# Patient Record
Sex: Female | Born: 1945
Health system: Southern US, Community
[De-identification: ages and names within clinical notes are randomized; demographics above are authoritative.]

## PROBLEM LIST (undated history)

## (undated) ENCOUNTER — Emergency Department (HOSPITAL_COMMUNITY): Payer: Self-pay | Source: Home / Self Care

## (undated) DIAGNOSIS — K649 Unspecified hemorrhoids: Secondary | ICD-10-CM

## (undated) DIAGNOSIS — K635 Polyp of colon: Secondary | ICD-10-CM

## (undated) DIAGNOSIS — K2940 Chronic atrophic gastritis without bleeding: Secondary | ICD-10-CM

## (undated) DIAGNOSIS — I2510 Atherosclerotic heart disease of native coronary artery without angina pectoris: Secondary | ICD-10-CM

## (undated) DIAGNOSIS — K449 Diaphragmatic hernia without obstruction or gangrene: Secondary | ICD-10-CM

## (undated) DIAGNOSIS — K5730 Diverticulosis of large intestine without perforation or abscess without bleeding: Secondary | ICD-10-CM

## (undated) DIAGNOSIS — I10 Essential (primary) hypertension: Secondary | ICD-10-CM

## (undated) DIAGNOSIS — M1990 Unspecified osteoarthritis, unspecified site: Secondary | ICD-10-CM

## (undated) DIAGNOSIS — D509 Iron deficiency anemia, unspecified: Secondary | ICD-10-CM

## (undated) HISTORY — DX: Diverticulosis of large intestine without perforation or abscess without bleeding: K57.30

## (undated) HISTORY — DX: Diaphragmatic hernia without obstruction or gangrene: K44.9

## (undated) HISTORY — DX: Chronic atrophic gastritis without bleeding: K29.40

## (undated) HISTORY — DX: Iron deficiency anemia, unspecified: D50.9

## (undated) HISTORY — DX: Polyp of colon: K63.5

## (undated) HISTORY — DX: Unspecified osteoarthritis, unspecified site: M19.90

## (undated) HISTORY — DX: Unspecified hemorrhoids: K64.9

## (undated) HISTORY — DX: Atherosclerotic heart disease of native coronary artery without angina pectoris: I25.10

## (undated) HISTORY — DX: Essential (primary) hypertension: I10

---

## 1968-04-15 HISTORY — PX: TONSILLECTOMY AND ADENOIDECTOMY: SUR1326

## 1976-04-15 HISTORY — PX: ABDOMINAL HYSTERECTOMY: SHX81

## 1977-04-15 HISTORY — PX: CHOLECYSTECTOMY: SHX55

## 1999-04-16 HISTORY — PX: KNEE SURGERY: SHX244

## 2004-03-21 ENCOUNTER — Emergency Department (HOSPITAL_COMMUNITY): Admission: EM | Admit: 2004-03-21 | Discharge: 2004-03-21 | Payer: Self-pay | Admitting: Family Medicine

## 2004-03-25 ENCOUNTER — Emergency Department (HOSPITAL_COMMUNITY): Admission: EM | Admit: 2004-03-25 | Discharge: 2004-03-25 | Payer: Self-pay | Admitting: Family Medicine

## 2005-03-20 ENCOUNTER — Ambulatory Visit: Payer: Self-pay | Admitting: Gastroenterology

## 2005-04-04 ENCOUNTER — Ambulatory Visit: Payer: Self-pay | Admitting: Oncology

## 2005-04-15 LAB — HM PAP SMEAR: HM Pap smear: NORMAL

## 2005-04-25 ENCOUNTER — Ambulatory Visit: Payer: Self-pay | Admitting: Gastroenterology

## 2005-05-09 ENCOUNTER — Ambulatory Visit: Payer: Self-pay | Admitting: Gastroenterology

## 2005-05-09 ENCOUNTER — Encounter (INDEPENDENT_AMBULATORY_CARE_PROVIDER_SITE_OTHER): Payer: Self-pay | Admitting: Specialist

## 2005-07-19 ENCOUNTER — Ambulatory Visit: Payer: Self-pay | Admitting: Oncology

## 2006-06-16 ENCOUNTER — Other Ambulatory Visit: Admission: RE | Admit: 2006-06-16 | Discharge: 2006-06-16 | Payer: Self-pay | Admitting: Family Medicine

## 2008-07-19 ENCOUNTER — Emergency Department (HOSPITAL_COMMUNITY): Admission: EM | Admit: 2008-07-19 | Discharge: 2008-07-19 | Payer: Self-pay | Admitting: Family Medicine

## 2008-12-26 ENCOUNTER — Emergency Department (HOSPITAL_COMMUNITY): Admission: EM | Admit: 2008-12-26 | Discharge: 2008-12-27 | Payer: Self-pay | Admitting: Emergency Medicine

## 2010-07-25 LAB — POCT URINALYSIS DIP (DEVICE)
Bilirubin Urine: NEGATIVE
Glucose, UA: NEGATIVE mg/dL
Ketones, ur: NEGATIVE mg/dL
Nitrite: POSITIVE — AB
Protein, ur: 100 mg/dL — AB
Specific Gravity, Urine: 1.02 (ref 1.005–1.030)
Urobilinogen, UA: 0.2 mg/dL (ref 0.0–1.0)
pH: 5.5 (ref 5.0–8.0)

## 2010-07-25 LAB — POCT I-STAT, CHEM 8
BUN: 17 mg/dL (ref 6–23)
Calcium, Ion: 1.21 mmol/L (ref 1.12–1.32)
Chloride: 107 mEq/L (ref 96–112)
Creatinine, Ser: 1.1 mg/dL (ref 0.4–1.2)
Glucose, Bld: 98 mg/dL (ref 70–99)
HCT: 33 % — ABNORMAL LOW (ref 36.0–46.0)
Hemoglobin: 11.2 g/dL — ABNORMAL LOW (ref 12.0–15.0)
Potassium: 3.8 mEq/L (ref 3.5–5.1)
Sodium: 142 mEq/L (ref 135–145)
TCO2: 26 mmol/L (ref 0–100)

## 2010-07-25 LAB — TSH: TSH: 1.307 u[IU]/mL (ref 0.350–4.500)

## 2010-10-27 ENCOUNTER — Inpatient Hospital Stay (INDEPENDENT_AMBULATORY_CARE_PROVIDER_SITE_OTHER)
Admission: RE | Admit: 2010-10-27 | Discharge: 2010-10-27 | Disposition: A | Discharge status: Home / Self Care | Payer: Medicare Other | Source: Ambulatory Visit | Attending: Emergency Medicine | Admitting: Emergency Medicine

## 2010-10-27 DIAGNOSIS — R6889 Other general symptoms and signs: Secondary | ICD-10-CM

## 2010-12-10 ENCOUNTER — Ambulatory Visit (INDEPENDENT_AMBULATORY_CARE_PROVIDER_SITE_OTHER): Payer: Medicare Other | Admitting: Internal Medicine

## 2010-12-10 ENCOUNTER — Other Ambulatory Visit: Payer: Self-pay | Admitting: Internal Medicine

## 2010-12-10 ENCOUNTER — Encounter: Payer: Self-pay | Admitting: Internal Medicine

## 2010-12-10 ENCOUNTER — Other Ambulatory Visit (INDEPENDENT_AMBULATORY_CARE_PROVIDER_SITE_OTHER): Payer: Medicare Other

## 2010-12-10 DIAGNOSIS — R300 Dysuria: Secondary | ICD-10-CM

## 2010-12-10 DIAGNOSIS — R5381 Other malaise: Secondary | ICD-10-CM

## 2010-12-10 DIAGNOSIS — K5732 Diverticulitis of large intestine without perforation or abscess without bleeding: Secondary | ICD-10-CM

## 2010-12-10 DIAGNOSIS — D649 Anemia, unspecified: Secondary | ICD-10-CM

## 2010-12-10 DIAGNOSIS — R5383 Other fatigue: Secondary | ICD-10-CM

## 2010-12-10 LAB — CBC WITH DIFFERENTIAL/PLATELET
Basophils Absolute: 0 10*3/uL (ref 0.0–0.1)
Basophils Relative: 0.5 % (ref 0.0–3.0)
Eosinophils Absolute: 0 10*3/uL (ref 0.0–0.7)
Eosinophils Relative: 0.7 % (ref 0.0–5.0)
HCT: 30.9 % — ABNORMAL LOW (ref 36.0–46.0)
Hemoglobin: 9.9 g/dL — ABNORMAL LOW (ref 12.0–15.0)
Lymphs Abs: 1.8 10*3/uL (ref 0.7–4.0)
MCHC: 32.2 g/dL (ref 30.0–36.0)
MCV: 71.2 fl — ABNORMAL LOW (ref 78.0–100.0)
Monocytes Absolute: 0.4 10*3/uL (ref 0.1–1.0)
Monocytes Relative: 7.1 % (ref 3.0–12.0)
Neutro Abs: 3.1 10*3/uL (ref 1.4–7.7)
Neutrophils Relative %: 57.7 % (ref 43.0–77.0)
Platelets: 474 10*3/uL — ABNORMAL HIGH (ref 150.0–400.0)
RBC: 4.34 Mil/uL (ref 3.87–5.11)
RDW: 19.6 % — ABNORMAL HIGH (ref 11.5–14.6)
WBC: 5.4 10*3/uL (ref 4.5–10.5)

## 2010-12-10 LAB — BASIC METABOLIC PANEL
BUN: 18 mg/dL (ref 6–23)
CO2: 29 mEq/L (ref 19–32)
Calcium: 9.5 mg/dL (ref 8.4–10.5)
Chloride: 105 mEq/L (ref 96–112)
Creatinine, Ser: 0.9 mg/dL (ref 0.4–1.2)
GFR: 76.8 mL/min (ref >60.00)
Glucose, Bld: 102 mg/dL — ABNORMAL HIGH (ref 70–99)
Potassium: 4.2 mEq/L (ref 3.5–5.1)
Sodium: 141 mEq/L (ref 135–145)

## 2010-12-10 LAB — URINALYSIS, ROUTINE W REFLEX MICROSCOPIC
Bilirubin Urine: NEGATIVE
Ketones, ur: NEGATIVE
Nitrite: NEGATIVE
Specific Gravity, Urine: 1.02 (ref 1.000–1.030)
Total Protein, Urine: NEGATIVE
Urine Glucose: NEGATIVE
pH: 6 (ref 5.0–8.0)

## 2010-12-10 LAB — HEPATIC FUNCTION PANEL
ALT: 21 U/L (ref 0–35)
AST: 16 U/L (ref 0–37)
Albumin: 4 g/dL (ref 3.5–5.2)
Alkaline Phosphatase: 107 U/L (ref 39–117)
Bilirubin, Direct: 0.1 mg/dL (ref 0.0–0.3)
Total Bilirubin: 0.3 mg/dL (ref 0.3–1.2)

## 2010-12-10 LAB — TSH: TSH: 1.56 u[IU]/mL (ref 0.35–5.50)

## 2010-12-10 MED ORDER — CIPROFLOXACIN HCL 500 MG PO TABS: 500.0000 mg | ORAL_TABLET | Freq: Two times a day (BID) | ORAL | Status: AC

## 2010-12-10 MED ORDER — METRONIDAZOLE 500 MG PO TABS: 500.0000 mg | ORAL_TABLET | Freq: Three times a day (TID) | ORAL | Status: AC

## 2010-12-10 NOTE — Patient Instructions (Signed)
It was good to see you today. We have reviewed your prior records including labs and tests today Test(s) ordered today. Your results will be called to you after review (48-72hours after test completion). If any changes need to be made, you will be notified at that time. Cipro + Flagyl for antibiotics to treatment colon infection - Your prescription(s) have been submitted to your pharmacy. Please take as directed and contact our office if you believe you are having problem(s) with the medication(s). Please schedule followup in 6-12 months, call sooner if problems.

## 2010-12-10 NOTE — Progress Notes (Signed)
  Subjective:    Patient ID: Jenna Thompson, female    DOB: 09/29/1945, 65 y.o.   MRN: [id number]  HPI  New pt to me, here to establish care  complains of LUQ and flank pain Onset 8 days ago, stable to gradually worse Not improved with OTC meds for pain Pain is 4/10 at this time - describes as "dull ache" - no radiation of pain Pain "exactly" like prior diverticulitis symptoms 2007 associated with mild dysuria and cramping - but denies hematuria, BM change or fever No trauma - precipitated by diet indiscretion last week  Past Medical History  Diagnosis Date  . Arthritis   . Colon polyps   . Hypertension    SocH: lives with/provides care for younger brother (mental/learning disablilities)  Review of Systems  Constitutional: Positive for fatigue. Negative for fever.  Respiratory: Negative for cough.   Cardiovascular: Negative for chest pain.  Genitourinary: Positive for dysuria.  Neurological: Negative for headaches.  otherwise see HPI above for pertinent positives     Objective:   Physical Exam BP 122/82  Pulse 77  Temp(Src) 98.4 F (36.9 C) (Oral)  Ht 5\' 10"  (1.778 m)  Wt 180 lb 1.9 oz (81.702 kg)  BMI 25.84 kg/m2  SpO2 97% Constitutional: She is oriented to person, place, and time. She appears well-developed and well-nourished. No distress.  HENT: Head: Normocephalic and atraumatic. Ears: B TMs ok, no erythema or effusion; Nose: Nose normal.  Mouth/Throat: Oropharynx is clear and moist. No oropharyngeal exudate.  Eyes: Conjunctivae and EOM are normal. Pupils are equal, round, and reactive to light. No scleral icterus.  Neck: Normal range of motion. Neck supple. No JVD present. No thyromegaly present.  Cardiovascular: Normal rate, regular rhythm and normal heart sounds.  No murmur heard. No BLE edema. Pulmonary/Chest: Effort normal and breath sounds normal. No respiratory distress. She has no wheezes.  Abdominal: Soft. Bowel sounds are normal. She exhibits no  distension. There is mild LUQ tenderness but no rebound/gaurding. no masses Musculoskeletal: B knees with boggy synovitis - wears brace on L chronically - Normal range of motion; No gross deformities Neurological: She is alert and oriented to person, place, and time. No cranial nerve deficit. Coordination normal.  Skin: Skin is warm and dry. No rash noted. No erythema.  Psychiatric: She has a normal mood and affect. Her behavior is normal. Judgment and thought content normal.       Assessment & Plan:  Diverticulitis - hx same in 2007 - nontoxic - check labs including UA to look for other problems - Cipro+flagyl erx done- to call if symptoms worse to consider other scanning as needed  Fatigue - nonspecific exam - check labs  Also See problem list. Medications and labs reviewed today.

## 2010-12-11 LAB — FERRITIN: Ferritin: 88.8 ng/mL (ref 10.0–291.0)

## 2010-12-12 ENCOUNTER — Encounter: Payer: Self-pay | Admitting: Gastroenterology

## 2010-12-26 ENCOUNTER — Encounter: Payer: Self-pay | Admitting: Gastroenterology

## 2010-12-26 ENCOUNTER — Ambulatory Visit (INDEPENDENT_AMBULATORY_CARE_PROVIDER_SITE_OTHER): Payer: Medicare Other | Admitting: Gastroenterology

## 2010-12-26 VITALS — BP 132/80 | HR 60 | Ht 70.0 in | Wt 210.0 lb

## 2010-12-26 DIAGNOSIS — Z860100 Personal history of colon polyps, unspecified: Secondary | ICD-10-CM | POA: Insufficient documentation

## 2010-12-26 DIAGNOSIS — R109 Unspecified abdominal pain: Secondary | ICD-10-CM

## 2010-12-26 DIAGNOSIS — D649 Anemia, unspecified: Secondary | ICD-10-CM

## 2010-12-26 MED ORDER — PEG-KCL-NACL-NASULF-NA ASC-C 100 G PO SOLR: 1.0000 | ORAL | Status: DC

## 2010-12-26 NOTE — Progress Notes (Signed)
HPI: This is a  very pleasant 65 year old woman.  She underwent a colonoscopy in 2007 with SML, 3 polyps were removed, only one was retrieved.  Path on that was HP. This information was reviewed by one of my partners (Dr. Leone Payor) in 2010 and he recommended colonoscopy at that time given lack of pathologic information.  A letter was mailed to her.  She was having left flank pain, left  Sided pain.  She was given cipro/flagyl for presumed diverticulitis.  Pains were present for about a week, cramp-like.   After a few days of the antibiotics her pains improved. CBC showed normal white blood cell count. She explained that the pains were exactly like her previous, presumed diverticulitis in 2007.      Review of systems: Pertinent positive and negative review of systems were noted in the above HPI section.  All other review of systems was otherwise negative.   Past Medical History  Diagnosis Date  . Colon polyps   . Hypertension   . Arthritis     L>R knee  . Diverticulosis of colon (without mention of hemorrhage)   . Unspecified hemorrhoids without mention of complication   . Diaphragmatic hernia without mention of obstruction or gangrene   . Atrophic gastritis without mention of hemorrhage   . Anemia, iron deficiency     Past Surgical History  Procedure Date  . Tonsillectomy and adenoidectomy 1970  . Abdominal hysterectomy 1978  . Cholecystectomy 1979  . Knee surgery 2001    left      reports that she has quit smoking. She has never used smokeless tobacco. She reports that she does not drink alcohol or use illicit drugs.  family history includes Kidney disease in her mother.  There is no history of Colon cancer.    Current Medications, Allergies were all reviewed with the patient via [facility] electronic medical record system.    Physical Exam: BP 132/80  Pulse 60  Ht 5\' 10"  (1.778 m)  Wt 210 lb (95.255 kg)  BMI 30.13 kg/m2 Constitutional: generally  well-appearing Psychiatric: alert and oriented x3 Eyes: extraocular movements intact Mouth: oral pharynx moist, no lesions Neck: supple no lymphadenopathy Cardiovascular: heart regular rate and rhythm Lungs: clear to auscultation bilaterally Abdomen: soft, nontender, nondistended, no obvious ascites, no peritoneal signs, normal bowel sounds Extremities: no lower extremity edema bilaterally Skin: no lesions on visible extremities    Assessment and plan: 65 y.o. female with recurrent left flank, left sided pain, history of colon polyps.  It is not clear that she has been having recurrent diverticulitis. The location of the pain is a bit more towards her back in his common with diverticulitis. However that indeed may be the case. She knows to call my office if she has a return of the left sided pains and I would like to get a CT scan at that point. She was recommended to have a repeat colonoscopy about 2 years ago and I think we should proceed with that now. She had polyps removed by another provider, several of which were never sent to pathology and so it is not clear if those were precancerous or not.

## 2010-12-26 NOTE — Patient Instructions (Signed)
You will be set up for a colonoscopy. Call Dr. Christella Hartigan' office if you have a repeat of the left sided abdominal pains. A copy of this information will be made available to Dr. Felicity Coyer.

## 2011-01-25 ENCOUNTER — Other Ambulatory Visit: Payer: Medicare Other | Admitting: Gastroenterology

## 2011-02-20 ENCOUNTER — Other Ambulatory Visit: Payer: Medicare Other | Admitting: Gastroenterology

## 2012-08-06 ENCOUNTER — Emergency Department: Payer: Self-pay | Admitting: Emergency Medicine

## 2012-08-19 ENCOUNTER — Emergency Department (HOSPITAL_COMMUNITY)
Admission: EM | Admit: 2012-08-19 | Discharge: 2012-08-19 | Disposition: A | Discharge status: Home / Self Care | Payer: Medicare Other | Source: Home / Self Care

## 2012-08-19 ENCOUNTER — Emergency Department (INDEPENDENT_AMBULATORY_CARE_PROVIDER_SITE_OTHER): Payer: Medicare Other

## 2012-08-19 ENCOUNTER — Encounter (HOSPITAL_COMMUNITY): Payer: Self-pay | Admitting: *Deleted

## 2012-08-19 DIAGNOSIS — S46911A Strain of unspecified muscle, fascia and tendon at shoulder and upper arm level, right arm, initial encounter: Secondary | ICD-10-CM

## 2012-08-19 DIAGNOSIS — IMO0002 Reserved for concepts with insufficient information to code with codable children: Secondary | ICD-10-CM

## 2012-08-19 DIAGNOSIS — M7541 Impingement syndrome of right shoulder: Secondary | ICD-10-CM

## 2012-08-19 MED ORDER — DICLOFENAC 35 MG PO CAPS: 1.0000 | ORAL_CAPSULE | Freq: Two times a day (BID) | ORAL | Status: DC

## 2012-08-19 MED ORDER — HYDROCODONE-ACETAMINOPHEN 5-325 MG PO TABS: ORAL_TABLET | ORAL | Status: DC
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
## 2012-08-19 NOTE — ED Notes (Signed)
Medium  r  Arm   Sling  Applied

## 2012-08-20 ENCOUNTER — Telehealth (HOSPITAL_COMMUNITY): Payer: Self-pay | Admitting: *Deleted

## 2012-08-20 NOTE — ED Notes (Signed)
Bernice from [facility] called to verify a Rx. of Diclofenac.  She said it does not come in 35 mg.  Discussed with Dr. Artis Flock.  He said it should be 75 mg.  Pharmacist notified of change. Vassie Moselle 08/20/2012

## 2012-08-21 NOTE — ED Provider Notes (Signed)
Medical screening examination/treatment/procedure(s) were performed by resident physician or non-physician practitioner and as supervising physician I was immediately available for consultation/collaboration.   Barkley Bruns MD.   Linna Hoff, MD 08/21/12 [phone]

## 2012-10-19 ENCOUNTER — Encounter (HOSPITAL_COMMUNITY): Payer: Self-pay | Admitting: Emergency Medicine

## 2012-10-19 ENCOUNTER — Emergency Department (INDEPENDENT_AMBULATORY_CARE_PROVIDER_SITE_OTHER): Payer: Medicare Other

## 2012-10-19 ENCOUNTER — Emergency Department (HOSPITAL_COMMUNITY)
Admission: EM | Admit: 2012-10-19 | Discharge: 2012-10-19 | Disposition: A | Discharge status: Home / Self Care | Payer: Medicare Other | Source: Home / Self Care

## 2012-10-19 DIAGNOSIS — S92912A Unspecified fracture of left toe(s), initial encounter for closed fracture: Secondary | ICD-10-CM

## 2012-10-19 DIAGNOSIS — IMO0002 Reserved for concepts with insufficient information to code with codable children: Secondary | ICD-10-CM

## 2012-10-19 DIAGNOSIS — S92919A Unspecified fracture of unspecified toe(s), initial encounter for closed fracture: Secondary | ICD-10-CM
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
## 2012-10-19 NOTE — ED Provider Notes (Signed)
History    CSN: [id number] Arrival date & time 10/19/12  1018  None    Chief Complaint  Patient presents with  . Foot Injury   (Consider location/radiation/quality/duration/timing/severity/associated sxs/prior Treatment) HPI Comments: This 67 year old female was walking when she struck her left foot/toes against an object. She is complaining of pain specifically in the fourth toe. She denies pain elsewhere to the foot. She is ambulatory and bearing full weight.  Past Medical History  Diagnosis Date  . Colon polyps   . Hypertension   . Arthritis     L>R knee  . Diverticulosis of colon (without mention of hemorrhage)   . Unspecified hemorrhoids without mention of complication   . Diaphragmatic hernia without mention of obstruction or gangrene   . Atrophic gastritis without mention of hemorrhage   . Anemia, iron deficiency   . Hiatal hernia    Past Surgical History  Procedure Laterality Date  . Tonsillectomy and adenoidectomy  1970  . Abdominal hysterectomy  1978  . Cholecystectomy  1979  . Knee surgery  2001    left    Family History  Problem Relation Age of Onset  . Kidney disease Mother   . Colon cancer Neg Hx    History  Substance Use Topics  . Smoking status: Former Games developer  . Smokeless tobacco: Never Used  . Alcohol Use: No   OB History   Grav Para Term Preterm Abortions TAB SAB Ect Mult Living                 Review of Systems  Constitutional: Negative for fever, chills and activity change.  HENT: Negative.   Respiratory: Negative.   Cardiovascular: Negative.   Musculoskeletal:       As per HPI  Skin: Negative for color change, pallor and rash.  Neurological: Negative.     Allergies  Nitrofurantoin monohyd macro and Sulfa antibiotics  Home Medications   Current Outpatient Rx  Name  Route  Sig  Dispense  Refill  . carbonyl iron ([facility]) 45 MG TABS   Oral   Take 45 mg by mouth daily.           . Diclofenac 35 MG CAPS   Oral   Take 1  capsule by mouth 2 (two) times daily after a meal.   20 capsule   0   . HYDROcodone-acetaminophen (NORCO/VICODIN) 5-325 MG per tablet      1/2 to 1 tab q 4 hours prn pain   15 tablet   0   . Multiple Vitamin (MULTIVITAMIN) capsule   Oral   Take 1 capsule by mouth daily.           . peg 3350 powder (MOVIPREP) 100 G SOLR   Oral   Take 1 kit (100 g total) by mouth as directed. See written handout   1 kit   0   . timolol (TIMOPTIC) 0.5 % ophthalmic solution      Use 1 drop in both eyes every day          BP 114/94  Pulse 76  Temp(Src) 98.5 F (36.9 C) (Oral)  SpO2 98% Physical Exam  Nursing note and vitals reviewed. Constitutional: She is oriented to person, place, and time. She appears well-developed and well-nourished.  Neck: Normal range of motion. Neck supple.  Pulmonary/Chest: Effort normal.  Musculoskeletal:  Tenderness to the fourth toe, and distal fourth metatarsal. No appreciable swelling, deformity or discoloration. Distal neurovascular motor sensory is intact. Limited  flexion of the left fourth toe due to pain.  Neurological: She is alert and oriented to person, place, and time.  Skin: Skin is warm and dry.  Psychiatric: She has a normal mood and affect.    ED Course  Procedures (including critical care time) Labs Reviewed - No data to display Dg Foot Complete Left  10/19/2012   *RADIOLOGY REPORT*  Clinical Data: Foot injury  LEFT FOOT - COMPLETE 3+ VIEW  Comparison: None.  Findings: Three views of the left foot submitted.  There is mild displaced fracture proximal phalanx fourth toe.  IMPRESSION: Mild displaced fracture proximal phalanx left fourth toe.   Original Report Authenticated By: Natasha Mead, M.D.   1. Fractured toe, left, closed, initial encounter     MDM  Buddy tape third and fourth toes. Wear hard sole shoe for the next couple of weeks. Keep elevated and apply ice off and on for the next 3-4 days. Followup with your primary care doctor as  needed.  Hayden Rasmussen, NP 10/19/12 1113

## 2012-10-19 NOTE — ED Provider Notes (Signed)
Medical screening examination/treatment/procedure(s) were performed by non-physician practitioner and as supervising physician I was immediately available for consultation/collaboration.  Leslee Home, M.D.  Reuben Likes, MD 10/19/12 [phone]

## 2012-11-30 ENCOUNTER — Other Ambulatory Visit: Payer: Self-pay | Admitting: Gastroenterology

## 2012-12-31 ENCOUNTER — Ambulatory Visit
Admission: RE | Admit: 2012-12-31 | Discharge: 2012-12-31 | Disposition: A | Discharge status: Home / Self Care | Payer: Medicare Other | Source: Ambulatory Visit | Attending: Family Medicine | Admitting: Family Medicine

## 2012-12-31 ENCOUNTER — Other Ambulatory Visit: Payer: Self-pay | Admitting: Family Medicine

## 2012-12-31 DIAGNOSIS — R599 Enlarged lymph nodes, unspecified: Secondary | ICD-10-CM

## 2016-12-20 ENCOUNTER — Ambulatory Visit (INDEPENDENT_AMBULATORY_CARE_PROVIDER_SITE_OTHER): Payer: Medicare Other

## 2016-12-20 ENCOUNTER — Encounter (INDEPENDENT_AMBULATORY_CARE_PROVIDER_SITE_OTHER): Payer: Self-pay | Admitting: Orthopaedic Surgery

## 2016-12-20 ENCOUNTER — Ambulatory Visit (INDEPENDENT_AMBULATORY_CARE_PROVIDER_SITE_OTHER): Payer: Medicare Other | Admitting: Orthopaedic Surgery

## 2016-12-20 ENCOUNTER — Ambulatory Visit (INDEPENDENT_AMBULATORY_CARE_PROVIDER_SITE_OTHER): Payer: Self-pay

## 2016-12-20 DIAGNOSIS — M1711 Unilateral primary osteoarthritis, right knee: Secondary | ICD-10-CM

## 2016-12-20 DIAGNOSIS — M1712 Unilateral primary osteoarthritis, left knee: Secondary | ICD-10-CM | POA: Diagnosis not present

## 2016-12-20 DIAGNOSIS — M170 Bilateral primary osteoarthritis of knee: Secondary | ICD-10-CM

## 2016-12-20 NOTE — Progress Notes (Signed)
Office Visit Note   Patient: Jenna Thompson           Date of Birth: [birth year]           MRN: [id number] Visit Date: 12/20/2016              Requested by: Wilfrid Lund, PA [address] [place], [place] [address] PCP: Deatra James, MD   Assessment & Plan: Visit Diagnoses:  1. Primary osteoarthritis of both knees     Plan: Patient has advanced degenerative joint disease worse on the left with valgus deformity of the left knee. We did perform a left knee cortisone injection as well as a right knee aspiration and injection today. Patient tolerates well. She is leaning towards having a knee replacement when she is done with her treatment for her DVT. I gave her reading material for knee replacement. We discussed the details of the surgery and the expected outcomes and recovery and the associated risks. She understands and will let us know when she is ready for knee replacement. She will likely begin with the left knee replacement.  Follow-Up Instructions: Return if symptoms worsen or fail to improve.   Orders:  Orders Placed This Encounter  Procedures  . XR KNEE 3 VIEW LEFT  . XR KNEE 3 VIEW RIGHT   No orders of the defined types were placed in this encounter.     Procedures: No procedures performed   Clinical Data: No additional findings.   Subjective: Chief Complaint  Patient presents with  . Right Knee - Pain  . Left Knee - Pain    Patient is a 71 year old female with bilateral knee pain worse on the left. This is been ongoing for several years. She has previously had left knee scope with meniscus debridement. 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 She is supposed to finish her treatment in October. She has tried conservative treatment and it sounds like now she is having deterioration and quality of life and ADLs related to her knees.    Review of Systems  Constitutional: Negative.   HENT: Negative.   Eyes:  Negative.   Respiratory: Negative.   Cardiovascular: Negative.   Endocrine: Negative.   Musculoskeletal: Negative.   Neurological: Negative.   Hematological: Negative.   Psychiatric/Behavioral: Negative.   All other systems reviewed and are negative.    Objective: Vital Signs: There were no vitals taken for this visit.  Physical Exam  Constitutional: She is oriented to person, place, and time. She appears well-developed and well-nourished.  HENT:  Head: Normocephalic and atraumatic.  Eyes: EOM are normal.  Neck: Neck supple.  Pulmonary/Chest: Effort normal.  Abdominal: Soft.  Neurological: She is alert and oriented to person, place, and time.  Skin: Skin is warm. Capillary refill takes less than 2 seconds.  Psychiatric: She has a normal mood and affect. Her behavior is normal. Judgment and thought content normal.  Nursing note and vitals reviewed.   Ortho Exam Left knee exam shows a valgus deformity with normal range of motion. Collaterals and cruciates are stable.  Right knee exam shows no fixed deformity. Normal range of motion. Moderate joint effusion. Collaterals and cruciates are stable. Specialty Comments:  No specialty comments available.  Imaging: Xr Knee 3 View Left  Result Date: 12/20/2016 Advanced degenerative joint disease with valgus deformity  Xr Knee 3 View Right  Result Date: 12/20/2016 Advanced degenerative joint disease    PMFS  History: Patient Active Problem List   Diagnosis Date Noted  . Primary osteoarthritis of both knees 12/20/2016  . Personal history of colonic polyps 12/26/2010   Past Medical History:  Diagnosis Date  . Anemia, iron deficiency   . Arthritis    L>R knee  . Atrophic gastritis without mention of hemorrhage   . Colon polyps   . Diaphragmatic hernia without mention of obstruction or gangrene   . Diverticulosis of colon (without mention of hemorrhage)   . Hiatal hernia   . Hypertension   . Unspecified hemorrhoids  without mention of complication     Family History  Problem Relation Age of Onset  . Kidney disease Mother   . Colon cancer Neg Hx     Past Surgical History:  Procedure Laterality Date  . ABDOMINAL HYSTERECTOMY  1978  . CHOLECYSTECTOMY  1979  . KNEE SURGERY  2001   left   . TONSILLECTOMY AND ADENOIDECTOMY  1970   Social History   Occupational History  . Retired    Social History Main Topics  . Smoking status: Former Games developer  . Smokeless tobacco: Never Used  . Alcohol use No  . Drug use: No  . Sexual activity: Not on file

## 2017-05-01 ENCOUNTER — Ambulatory Visit (HOSPITAL_COMMUNITY): Admit: 2017-05-01 | Payer: Self-pay | Admitting: Cardiovascular Disease

## 2017-05-01 ENCOUNTER — Encounter (HOSPITAL_COMMUNITY): Payer: Self-pay

## 2017-05-01 ENCOUNTER — Inpatient Hospital Stay (HOSPITAL_COMMUNITY)
Admission: EM | Disposition: A | Discharge status: Home / Self Care | Payer: Self-pay | Source: Home / Self Care | Attending: Cardiovascular Disease

## 2017-05-01 ENCOUNTER — Inpatient Hospital Stay (HOSPITAL_COMMUNITY)
Admission: EM | Admit: 2017-05-01 | Discharge: 2017-05-05 | DRG: 251 | Disposition: A | Discharge status: Home / Self Care | Payer: Medicare Other | Attending: Cardiovascular Disease | Admitting: Cardiovascular Disease

## 2017-05-01 DIAGNOSIS — K5730 Diverticulosis of large intestine without perforation or abscess without bleeding: Secondary | ICD-10-CM | POA: Diagnosis present

## 2017-05-01 DIAGNOSIS — Z86010 Personal history of colonic polyps: Secondary | ICD-10-CM | POA: Diagnosis not present

## 2017-05-01 DIAGNOSIS — E785 Hyperlipidemia, unspecified: Secondary | ICD-10-CM

## 2017-05-01 DIAGNOSIS — I10 Essential (primary) hypertension: Secondary | ICD-10-CM | POA: Diagnosis present

## 2017-05-01 DIAGNOSIS — I2119 ST elevation (STEMI) myocardial infarction involving other coronary artery of inferior wall: Secondary | ICD-10-CM | POA: Diagnosis present

## 2017-05-01 DIAGNOSIS — Z7901 Long term (current) use of anticoagulants: Secondary | ICD-10-CM | POA: Diagnosis not present

## 2017-05-01 DIAGNOSIS — D509 Iron deficiency anemia, unspecified: Secondary | ICD-10-CM | POA: Diagnosis present

## 2017-05-01 DIAGNOSIS — Z882 Allergy status to sulfonamides status: Secondary | ICD-10-CM

## 2017-05-01 DIAGNOSIS — Z888 Allergy status to other drugs, medicaments and biological substances status: Secondary | ICD-10-CM | POA: Diagnosis not present

## 2017-05-01 DIAGNOSIS — K449 Diaphragmatic hernia without obstruction or gangrene: Secondary | ICD-10-CM | POA: Diagnosis present

## 2017-05-01 DIAGNOSIS — Z90710 Acquired absence of both cervix and uterus: Secondary | ICD-10-CM

## 2017-05-01 DIAGNOSIS — Z9861 Coronary angioplasty status: Secondary | ICD-10-CM | POA: Diagnosis not present

## 2017-05-01 DIAGNOSIS — Z9049 Acquired absence of other specified parts of digestive tract: Secondary | ICD-10-CM

## 2017-05-01 DIAGNOSIS — Z841 Family history of disorders of kidney and ureter: Secondary | ICD-10-CM | POA: Diagnosis not present

## 2017-05-01 DIAGNOSIS — I2111 ST elevation (STEMI) myocardial infarction involving right coronary artery: Secondary | ICD-10-CM

## 2017-05-01 DIAGNOSIS — D649 Anemia, unspecified: Secondary | ICD-10-CM | POA: Diagnosis not present

## 2017-05-01 DIAGNOSIS — M170 Bilateral primary osteoarthritis of knee: Secondary | ICD-10-CM | POA: Diagnosis present

## 2017-05-01 DIAGNOSIS — D500 Iron deficiency anemia secondary to blood loss (chronic): Secondary | ICD-10-CM | POA: Diagnosis not present

## 2017-05-01 DIAGNOSIS — R079 Chest pain, unspecified: Secondary | ICD-10-CM | POA: Diagnosis present

## 2017-05-01 DIAGNOSIS — I219 Acute myocardial infarction, unspecified: Secondary | ICD-10-CM

## 2017-05-01 DIAGNOSIS — Z86718 Personal history of other venous thrombosis and embolism: Secondary | ICD-10-CM

## 2017-05-01 DIAGNOSIS — I2510 Atherosclerotic heart disease of native coronary artery without angina pectoris: Secondary | ICD-10-CM

## 2017-05-01 HISTORY — PX: CORONARY/GRAFT ACUTE MI REVASCULARIZATION: CATH118305

## 2017-05-01 HISTORY — PX: LEFT HEART CATH AND CORONARY ANGIOGRAPHY: CATH118249

## 2017-05-01 LAB — CBC WITH DIFFERENTIAL/PLATELET
BASOS ABS: 0.1 10*3/uL (ref 0.0–0.1)
BASOS PCT: 2 %
EOS ABS: 0.1 10*3/uL (ref 0.0–0.7)
Eosinophils Relative: 1 %
HEMATOCRIT: 30.3 % — AB (ref 36.0–46.0)
HEMOGLOBIN: 8.9 g/dL — AB (ref 12.0–15.0)
LYMPHS PCT: 51 %
Lymphs Abs: 3.1 10*3/uL (ref 0.7–4.0)
MCH: 21.8 pg — AB (ref 26.0–34.0)
MCHC: 29.4 g/dL — ABNORMAL LOW (ref 30.0–36.0)
MCV: 74.1 fL — ABNORMAL LOW (ref 78.0–100.0)
MONOS PCT: 8 %
Monocytes Absolute: 0.5 10*3/uL (ref 0.1–1.0)
NEUTROS PCT: 38 %
Neutro Abs: 2.4 10*3/uL (ref 1.7–7.7)
Platelets: 305 10*3/uL (ref 150–400)
RBC: 4.09 MIL/uL (ref 3.87–5.11)
RDW: 22 % — ABNORMAL HIGH (ref 11.5–15.5)
WBC: 6.2 10*3/uL (ref 4.0–10.5)

## 2017-05-01 LAB — POCT ACTIVATED CLOTTING TIME
Activated Clotting Time: 208 seconds
Activated Clotting Time: 324 seconds

## 2017-05-01 LAB — LIPID PANEL
CHOL/HDL RATIO: 4.6 ratio
Cholesterol: 194 mg/dL (ref 0–200)
HDL: 42 mg/dL (ref >40)
LDL CALC: 132 mg/dL — AB (ref 0–99)
TRIGLYCERIDES: 98 mg/dL (ref <150)
VLDL: 20 mg/dL (ref 0–40)

## 2017-05-01 LAB — POCT I-STAT, CHEM 8
BUN: 16 mg/dL (ref 6–20)
CREATININE: 0.6 mg/dL (ref 0.44–1.00)
Calcium, Ion: 1.2 mmol/L (ref 1.15–1.40)
Chloride: 98 mmol/L — ABNORMAL LOW (ref 101–111)
Glucose, Bld: 173 mg/dL — ABNORMAL HIGH (ref 65–99)
HCT: 26 % — ABNORMAL LOW (ref 36.0–46.0)
HEMOGLOBIN: 8.8 g/dL — AB (ref 12.0–15.0)
POTASSIUM: 3.2 mmol/L — AB (ref 3.5–5.1)
SODIUM: 135 mmol/L (ref 135–145)
TCO2: 22 mmol/L (ref 22–32)

## 2017-05-01 LAB — COMPREHENSIVE METABOLIC PANEL
ALBUMIN: 3.6 g/dL (ref 3.5–5.0)
ALT: 17 U/L (ref 14–54)
ANION GAP: 10 (ref 5–15)
AST: 20 U/L (ref 15–41)
Alkaline Phosphatase: 104 U/L (ref 38–126)
BUN: 17 mg/dL (ref 6–20)
CO2: 23 mmol/L (ref 22–32)
Calcium: 9 mg/dL (ref 8.9–10.3)
Chloride: 105 mmol/L (ref 101–111)
Creatinine, Ser: 1.2 mg/dL — ABNORMAL HIGH (ref 0.44–1.00)
GFR calc Af Amer: 51 mL/min — ABNORMAL LOW (ref >60)
GFR calc non Af Amer: 44 mL/min — ABNORMAL LOW (ref >60)
GLUCOSE: 204 mg/dL — AB (ref 65–99)
POTASSIUM: 3.9 mmol/L (ref 3.5–5.1)
SODIUM: 138 mmol/L (ref 135–145)
Total Bilirubin: 0.7 mg/dL (ref 0.3–1.2)
Total Protein: 6.2 g/dL — ABNORMAL LOW (ref 6.5–8.1)

## 2017-05-01 LAB — CBC
HCT: 29.7 % — ABNORMAL LOW (ref 36.0–46.0)
Hemoglobin: 8.5 g/dL — ABNORMAL LOW (ref 12.0–15.0)
MCH: 21.4 pg — AB (ref 26.0–34.0)
MCHC: 28.6 g/dL — ABNORMAL LOW (ref 30.0–36.0)
MCV: 74.6 fL — AB (ref 78.0–100.0)
PLATELETS: 299 10*3/uL (ref 150–400)
RBC: 3.98 MIL/uL (ref 3.87–5.11)
RDW: 21.4 % — ABNORMAL HIGH (ref 11.5–15.5)
WBC: 8.2 10*3/uL (ref 4.0–10.5)

## 2017-05-01 LAB — TROPONIN I
Troponin I: <0.03 ng/mL (ref <0.03)
Troponin I: 11.97 ng/mL (ref <0.03)
Troponin I: 40.81 ng/mL (ref <0.03)

## 2017-05-01 LAB — PROTIME-INR
INR: 1
PROTHROMBIN TIME: 13.1 s (ref 11.4–15.2)

## 2017-05-01 LAB — APTT: aPTT: 21 seconds — ABNORMAL LOW (ref 24–36)

## 2017-05-01 SURGERY — LEFT HEART CATH AND CORONARY ANGIOGRAPHY
Anesthesia: LOCAL

## 2017-05-01 MED ORDER — FENTANYL CITRATE (PF) 100 MCG/2ML IJ SOLN
INTRAMUSCULAR | Status: DC | PRN
  Administered 2017-05-01: 25 ug via INTRAVENOUS

## 2017-05-01 MED ORDER — SODIUM CHLORIDE 0.9 % IV SOLN: 250.0000 mL | INTRAVENOUS | Status: DC | PRN

## 2017-05-01 MED ORDER — TIROFIBAN HCL IN NACL 5-0.9 MG/100ML-% IV SOLN: 0.0750 ug/kg/min | INTRAVENOUS | Status: DC

## 2017-05-01 MED ORDER — ACETAMINOPHEN 325 MG PO TABS: 650.0000 mg | ORAL_TABLET | ORAL | Status: DC | PRN

## 2017-05-01 MED ORDER — MORPHINE SULFATE (PF) 4 MG/ML IV SOLN
2.0000 mg | INTRAVENOUS | Status: DC | PRN
Start: 2017-05-01 — End: 2017-05-05

## 2017-05-01 MED ORDER — HEPARIN SODIUM (PORCINE) 1000 UNIT/ML IJ SOLN
INTRAMUSCULAR | Status: AC
  Filled 2017-05-01: qty 1

## 2017-05-01 MED ORDER — IOPAMIDOL (ISOVUE-370) INJECTION 76%
INTRAVENOUS | Status: AC
  Filled 2017-05-01: qty 125

## 2017-05-01 MED ORDER — MIDAZOLAM HCL 2 MG/2ML IJ SOLN
INTRAMUSCULAR | Status: AC
  Filled 2017-05-01: qty 2

## 2017-05-01 MED ORDER — VERAPAMIL HCL 2.5 MG/ML IV SOLN
INTRAVENOUS | Status: AC
  Filled 2017-05-01: qty 2

## 2017-05-01 MED ORDER — HEPARIN SODIUM (PORCINE) 1000 UNIT/ML IJ SOLN
INTRAMUSCULAR | Status: DC | PRN
  Administered 2017-05-01: 6000 [IU] via INTRAVENOUS
  Administered 2017-05-01: 4000 [IU] via INTRAVENOUS

## 2017-05-01 MED ORDER — IOPAMIDOL (ISOVUE-370) INJECTION 76%
INTRAVENOUS | Status: DC | PRN
  Administered 2017-05-01: 145 mL via INTRA_ARTERIAL

## 2017-05-01 MED ORDER — SODIUM CHLORIDE 0.9 % IV SOLN: INTRAVENOUS | Status: DC | PRN

## 2017-05-01 MED ORDER — ONDANSETRON HCL 4 MG/2ML IJ SOLN: 4.0000 mg | Freq: Four times a day (QID) | INTRAMUSCULAR | Status: DC | PRN

## 2017-05-01 MED ORDER — ZOLPIDEM TARTRATE 5 MG PO TABS
5.0000 mg | ORAL_TABLET | Freq: Every evening | ORAL | Status: DC | PRN
  Administered 2017-05-01: 5 mg via ORAL
  Filled 2017-05-01: qty 1

## 2017-05-01 MED ORDER — LABETALOL HCL 5 MG/ML IV SOLN: 10.0000 mg | INTRAVENOUS | Status: AC | PRN

## 2017-05-01 MED ORDER — SODIUM CHLORIDE 0.9% FLUSH
3.0000 mL | Freq: Two times a day (BID) | INTRAVENOUS | Status: DC
  Administered 2017-05-01 (×2): 3 mL via INTRAVENOUS

## 2017-05-01 MED ORDER — SODIUM CHLORIDE 0.9% FLUSH: 3.0000 mL | INTRAVENOUS | Status: DC | PRN

## 2017-05-01 MED ORDER — VERAPAMIL HCL 2.5 MG/ML IV SOLN
INTRAVENOUS | Status: DC | PRN
  Administered 2017-05-01: 10 mL via INTRA_ARTERIAL

## 2017-05-01 MED ORDER — HEPARIN (PORCINE) IN NACL 2-0.9 UNIT/ML-% IJ SOLN
INTRAMUSCULAR | Status: AC | PRN
  Administered 2017-05-01: 1000 mL

## 2017-05-01 MED ORDER — METOPROLOL TARTRATE 25 MG PO TABS
25.0000 mg | ORAL_TABLET | Freq: Two times a day (BID) | ORAL | Status: DC
  Administered 2017-05-01 – 2017-05-05 (×9): 25 mg via ORAL
  Filled 2017-05-01 (×9): qty 1

## 2017-05-01 MED ORDER — NITROGLYCERIN IN D5W 200-5 MCG/ML-% IV SOLN
0.0000 ug/min | INTRAVENOUS | Status: DC
  Administered 2017-05-01: 5 ug/min via INTRAVENOUS
  Filled 2017-05-01: qty 250

## 2017-05-01 MED ORDER — TIROFIBAN HCL IN NACL 5-0.9 MG/100ML-% IV SOLN
INTRAVENOUS | Status: AC
  Filled 2017-05-01: qty 100

## 2017-05-01 MED ORDER — TIROFIBAN HCL IN NACL 5-0.9 MG/100ML-% IV SOLN
0.0750 ug/kg/min | INTRAVENOUS | Status: DC
  Administered 2017-05-01 (×2): 0.075 ug/kg/min via INTRAVENOUS
  Filled 2017-05-01 (×3): qty 100

## 2017-05-01 MED ORDER — HYDRALAZINE HCL 20 MG/ML IJ SOLN: 5.0000 mg | INTRAMUSCULAR | Status: AC | PRN

## 2017-05-01 MED ORDER — ATORVASTATIN CALCIUM 80 MG PO TABS
80.0000 mg | ORAL_TABLET | Freq: Every day | ORAL | Status: DC
  Administered 2017-05-01 – 2017-05-04 (×4): 80 mg via ORAL
  Filled 2017-05-01 (×4): qty 1

## 2017-05-01 MED ORDER — SODIUM CHLORIDE 0.9 % IV SOLN: INTRAVENOUS | Status: AC

## 2017-05-01 MED ORDER — HEPARIN (PORCINE) IN NACL 2-0.9 UNIT/ML-% IJ SOLN
INTRAMUSCULAR | Status: AC
  Filled 2017-05-01: qty 1000

## 2017-05-01 MED ORDER — SODIUM CHLORIDE 0.9 % IV SOLN
INTRAVENOUS | Status: DC
  Administered 2017-05-01: 22:00:00 via INTRAVENOUS

## 2017-05-01 MED ORDER — TIROFIBAN (AGGRASTAT) BOLUS VIA INFUSION
INTRAVENOUS | Status: DC | PRN
  Administered 2017-05-01: 1927.5 ug via INTRAVENOUS

## 2017-05-01 MED ORDER — ALUM & MAG HYDROXIDE-SIMETH 200-200-20 MG/5ML PO SUSP
15.0000 mL | Freq: Four times a day (QID) | ORAL | Status: DC | PRN
  Administered 2017-05-01 – 2017-05-04 (×3): 15 mL via ORAL
  Filled 2017-05-01 (×3): qty 30

## 2017-05-01 MED ORDER — HEPARIN SODIUM (PORCINE) 5000 UNIT/ML IJ SOLN
INTRAMUSCULAR | Status: AC
  Filled 2017-05-01: qty 1

## 2017-05-01 MED ORDER — LIDOCAINE HCL (PF) 1 % IJ SOLN
INTRAMUSCULAR | Status: AC
  Filled 2017-05-01: qty 30

## 2017-05-01 MED ORDER — MIDAZOLAM HCL 2 MG/2ML IJ SOLN
INTRAMUSCULAR | Status: DC | PRN
  Administered 2017-05-01: 1 mg via INTRAVENOUS

## 2017-05-01 MED ORDER — SODIUM CHLORIDE 0.9 % IV SOLN: INTRAVENOUS | Status: DC

## 2017-05-01 MED ORDER — ASPIRIN 81 MG PO CHEW
81.0000 mg | CHEWABLE_TABLET | Freq: Every day | ORAL | Status: DC
  Administered 2017-05-02 – 2017-05-05 (×4): 81 mg via ORAL
  Filled 2017-05-01 (×4): qty 1

## 2017-05-01 MED ORDER — TICAGRELOR 90 MG PO TABS
90.0000 mg | ORAL_TABLET | Freq: Two times a day (BID) | ORAL | Status: DC
  Administered 2017-05-01 – 2017-05-05 (×8): 90 mg via ORAL
  Filled 2017-05-01 (×8): qty 1

## 2017-05-01 MED ORDER — TIMOLOL MALEATE 0.5 % OP SOLN
1.0000 [drp] | Freq: Every day | OPHTHALMIC | Status: DC
  Administered 2017-05-01 – 2017-05-05 (×5): 1 [drp] via OPHTHALMIC
  Filled 2017-05-01: qty 5

## 2017-05-01 MED ORDER — FENTANYL CITRATE (PF) 100 MCG/2ML IJ SOLN
INTRAMUSCULAR | Status: AC
  Filled 2017-05-01: qty 2

## 2017-05-01 MED ORDER — TICAGRELOR 90 MG PO TABS
ORAL_TABLET | ORAL | Status: AC
  Filled 2017-05-01: qty 2

## 2017-05-01 MED ORDER — TICAGRELOR 90 MG PO TABS
ORAL_TABLET | ORAL | Status: DC | PRN
  Administered 2017-05-01: 180 mg via ORAL

## 2017-05-01 MED ORDER — BIVALIRUDIN BOLUS VIA INFUSION - CUPID: INTRAVENOUS | Status: DC | PRN

## 2017-05-01 MED ORDER — MORPHINE SULFATE (PF) 4 MG/ML IV SOLN
2.0000 mg | INTRAVENOUS | Status: DC | PRN
Start: 2017-05-01 — End: 2017-05-01
  Administered 2017-05-01 (×2): 2 mg via INTRAVENOUS
  Filled 2017-05-01 (×2): qty 1

## 2017-05-01 MED ORDER — HEPARIN SODIUM (PORCINE) 5000 UNIT/ML IJ SOLN
4000.0000 [IU] | Freq: Once | INTRAMUSCULAR | Status: AC
  Administered 2017-05-01: 4000 [IU] via INTRAVENOUS

## 2017-05-01 MED ORDER — ATROPINE SULFATE 1 MG/10ML IJ SOSY
PREFILLED_SYRINGE | INTRAMUSCULAR | Status: AC
  Filled 2017-05-01: qty 10

## 2017-05-01 MED ORDER — TIROFIBAN HCL IN NACL 5-0.9 MG/100ML-% IV SOLN
INTRAVENOUS | Status: AC | PRN
  Administered 2017-05-01: 0.15 ug/kg/min via INTRAVENOUS

## 2017-05-01 MED ORDER — LIDOCAINE HCL (PF) 1 % IJ SOLN
INTRAMUSCULAR | Status: DC | PRN
  Administered 2017-05-01: 2 mL

## 2017-05-01 SURGICAL SUPPLY — 20 items
BALLN SAPPHIRE 2.5X15 (BALLOONS) ×2
BALLOON SAPPHIRE 2.5X15 (BALLOONS) IMPLANT
CATH 5FR JL3.5 JR4 ANG PIG MP (CATHETERS) ×1 IMPLANT
CATH EXTRAC PRONTO 5.5F 138CM (CATHETERS) ×1 IMPLANT
CATH LAUNCHER 6FR AL1 (CATHETERS) IMPLANT
CATHETER LAUNCHER 6FR AL1 (CATHETERS) ×2
DEVICE RAD COMP TR BAND LRG (VASCULAR PRODUCTS) ×1 IMPLANT
ELECT DEFIB PAD ADLT CADENCE (PAD) ×1 IMPLANT
GLIDESHEATH SLEND SS 6F .021 (SHEATH) ×1 IMPLANT
GUIDEWIRE INQWIRE 1.5J.035X260 (WIRE) IMPLANT
INQWIRE 1.5J .035X260CM (WIRE) ×2
KIT ENCORE 26 ADVANTAGE (KITS) ×1 IMPLANT
KIT HEART LEFT (KITS) ×2 IMPLANT
PACK CARDIAC CATHETERIZATION (CUSTOM PROCEDURE TRAY) ×2 IMPLANT
SYR MEDRAD MARK V 150ML (SYRINGE) ×2 IMPLANT
TRANSDUCER W/STOPCOCK (MISCELLANEOUS) ×2 IMPLANT
TUBING CIL FLEX 10 FLL-RA (TUBING) ×2 IMPLANT
WIRE ASAHI FIELDER XT 190CM (WIRE) ×1 IMPLANT
WIRE COUGAR XT STRL 190CM (WIRE) ×1 IMPLANT
WIRE HI TORQ WHISPER MS 190CM (WIRE) ×1 IMPLANT

## 2017-05-01 NOTE — H&P (View-Only) (Signed)
     Came to see pt this PM - still has occluded rPDA with moderate ~6/10 CP & remains hypertensive.  Will start IV NTG gtt Has indigestion - prns provided.  Plan is relook cath & ? staged PCI of rPDA tomorrow.  David Harding, MD  

## 2017-05-01 NOTE — Progress Notes (Signed)
     Came to see pt this PM - still has occluded rPDA with moderate ~6/10 CP & remains hypertensive.  Will start IV NTG gtt Has indigestion - prns provided.  Plan is relook cath & ? staged PCI of rPDA tomorrow.  Bryan Lemma, MD

## 2017-05-01 NOTE — H&P (Signed)
Patient ID: Jenna Thompson MRN: [id number] DOB/AGE: [id number] y.o. Admit date: 05/01/2017  Primary Care Physician: Donald Prose, MD Primary Cardiologist: New  HPI: 72 yo female with history of OA, hiatal hernia, HTN, prior DVT who presented to the ED via EMS this morning with c/o chest pain. EKG with 2-3 mm inferior ST elevation. Code STEMI called by EMS. Pt with ongoing pain in the ED. Her pain woke her this am. The pain radiated to her arms. Associated dyspnea, nausea and diaphoresis.   Review of systems complete and found to be negative unless listed above   Past Medical History:  Diagnosis Date  . Anemia, iron deficiency   . Arthritis    L>R knee  . Atrophic gastritis without mention of hemorrhage   . Colon polyps   . Diaphragmatic hernia without mention of obstruction or gangrene   . Diverticulosis of colon (without mention of hemorrhage)   . Hiatal hernia   . Hypertension   . Unspecified hemorrhoids without mention of complication     Family History  Problem Relation Age of Onset  . Kidney disease Mother   . Colon cancer Neg Hx     Social History   Socioeconomic History  . Marital status: Legally Separated    Spouse name: Not on file  . Number of children: 4  . Years of education: Not on file  . Highest education level: Not on file  Social Needs  . Financial resource strain: Not on file  . Food insecurity - worry: Not on file  . Food insecurity - inability: Not on file  . Transportation needs - medical: Not on file  . Transportation needs - non-medical: Not on file  Occupational History  . Occupation: Retired  Tobacco Use  . Smoking status: Former Research scientist (life sciences)  . Smokeless tobacco: Never Used  Substance and Sexual Activity  . Alcohol use: No  . Drug use: No  . Sexual activity: Not on file  Other Topics Concern  . Not on file  Social History Narrative   2 caffeine drinks daily    Past Surgical History:  Procedure Laterality Date  . ABDOMINAL  HYSTERECTOMY  1978  . CHOLECYSTECTOMY  1979  . KNEE SURGERY  2001   left   . TONSILLECTOMY AND ADENOIDECTOMY  1970    Allergies  Allergen Reactions  . Nitrofurantoin Monohyd Macro   . Sulfa Antibiotics     Prior to Admission Meds:  Prior to Admission medications   Medication Sig Start Date End Date Taking? Authorizing Provider  carbonyl iron ([facility] IRON) 45 MG TABS Take 45 mg by mouth daily.      [provider]  Diclofenac 35 MG CAPS Take 1 capsule by mouth 2 (two) times daily after a meal. Patient not taking: Reported on 12/20/2016 08/19/12   Janne Napoleon, NP  HYDROcodone-acetaminophen (NORCO/VICODIN) 5-325 MG per tablet 1/2 to 1 tab q 4 hours prn pain Patient not taking: Reported on 12/20/2016 08/19/12   Janne Napoleon, NP  Multiple Vitamin (MULTIVITAMIN) capsule Take 1 capsule by mouth daily.      [provider]  peg 3350 powder (MOVIPREP) 100 G SOLR Take 1 kit (100 g total) by mouth as directed. See written handout Patient not taking: Reported on 12/20/2016 12/26/10   Milus Banister, MD  timolol (TIMOPTIC) 0.5 % ophthalmic solution Use 1 drop in both eyes every day 11/14/10   [provider]  XARELTO 15 MG TABS tablet TAKE 1 TABLET  BY MOUTH TWICE A DAY FOR 14 DAYS 11/05/16   [provider]    Physical Exam: Blood pressure 130/74, pulse (!) 57, temperature (!) 97.4 F (36.3 C), temperature source Oral, resp. rate 19, height 5' 10"  (1.778 m), weight 170 lb (77.1 kg), SpO2 96 %.    General: Well developed, well nourished, uncomfortable appearing.  HEENT: OP clear, mucus membranes moist  SKIN: warm, dry. No rashes.  Neuro: No focal deficits  Musculoskeletal: Muscle strength 5/5 all ext  Psychiatric: Mood and affect normal  Neck: No JVD, no carotid bruits, no thyromegaly, no lymphadenopathy.  Lungs:Clear bilaterally, no wheezes, rhonci, crackles  Cardiovascular: Regular rate and rhythm. No murmurs, gallops or rubs.  Abdomen:Soft. Bowel sounds present.  Non-tender.  Extremities: No lower extremity edema. Pulses are 2 + in the bilateral DP/PT.   Labs: pending  EKG: sinus, 2-3 mm ST elevation inferior leads.   ASSESSMENT AND PLAN:   1. Acute inferior STEMI: Pt with ongoing pain. EKG suggests acute MI. Will plan emergent cardiac cath with possible PCI.   Darlina Guys, MD 05/01/2017, 6:47 AM

## 2017-05-01 NOTE — Progress Notes (Addendum)
RN called re: ongoing CP Pt got some improvement w/ IV Nitro and MSO4 but is now having additional pain.  Pt seen earlier by Dr Herbie Baltimore for same.   Plan is for recath in am w/ possible PCI PDA.  Encourage up-titration of Nitro, additional morphine and recheck ECG.   Theodore Demark, PA-C 05/01/2017 6:57 PM Beeper [phone]  Addendum: Pt resting more comfortably now. ECG significantly improved. Continue current care.  NPO after midnight.  Theodore Demark, PA-C 05/01/2017 7:37 PM Beeper [phone]

## 2017-05-01 NOTE — ED Triage Notes (Signed)
Pt comes via [facility], woke up at 5 am with CP, pt thought it was indigestion. Diaphoretic, vomited x 1. PTA 324 ASA, 6 mg Morphine, 3 nitro

## 2017-05-01 NOTE — Progress Notes (Signed)
CRITICAL VALUE ALERT  Critical Value:  Trop 11.97  Date & Time Notied:  1649 05/01/2017  Provider Notified: Georgie Chard NP  Orders Received/Actions taken: Noreene Larsson will follow up

## 2017-05-01 NOTE — Progress Notes (Signed)
Slight ooze noted from Rt. Cath site.  Pressure held for additional 10 min and then pressure dressing applied.  No bleeding noted after 10 min of holding pressure to site.

## 2017-05-01 NOTE — ED Provider Notes (Signed)
[facility] EMERGENCY DEPARTMENT Provider Note   CSN: [id number] Arrival date & time: 05/01/17  5784     History   Chief Complaint Chief Complaint  Patient presents with  . Code STEMI    HPI Jenna Thompson is a 72 y.o. female.  Patient is a 72 year old female with past medical history of hypertension, prior DVT presenting for evaluation of chest pain.  She woke this morning with crushing substernal chest pain radiating into her arms.  There was associated shortness of breath, nausea, and diaphoresis.  911 was called and the patient was transported here.  While in route her EKG is diagnostic of an inferior wall ST elevation MI.  She arrived here as a code STEMI complaining of ongoing pain.   The history is provided by the patient.    Past Medical History:  Diagnosis Date  . Anemia, iron deficiency   . Arthritis    L>R knee  . Atrophic gastritis without mention of hemorrhage   . Colon polyps   . Diaphragmatic hernia without mention of obstruction or gangrene   . Diverticulosis of colon (without mention of hemorrhage)   . Hiatal hernia   . Hypertension   . Unspecified hemorrhoids without mention of complication     Patient Active Problem List   Diagnosis Date Noted  . Primary osteoarthritis of both knees 12/20/2016  . Personal history of colonic polyps 12/26/2010    Past Surgical History:  Procedure Laterality Date  . ABDOMINAL HYSTERECTOMY  1978  . CHOLECYSTECTOMY  1979  . KNEE SURGERY  2001   left   . TONSILLECTOMY AND ADENOIDECTOMY  1970    OB History    No data available       Home Medications    Prior to Admission medications   Medication Sig Start Date End Date Taking? Authorizing Provider  carbonyl iron ([facility] IRON) 45 MG TABS Take 45 mg by mouth daily.      [provider]  Diclofenac 35 MG CAPS Take 1 capsule by mouth 2 (two) times daily after a meal. Patient not taking: Reported on 12/20/2016 08/19/12   Janne Napoleon, NP    HYDROcodone-acetaminophen (NORCO/VICODIN) 5-325 MG per tablet 1/2 to 1 tab q 4 hours prn pain Patient not taking: Reported on 12/20/2016 08/19/12   Janne Napoleon, NP  Multiple Vitamin (MULTIVITAMIN) capsule Take 1 capsule by mouth daily.      [provider]  peg 3350 powder (MOVIPREP) 100 G SOLR Take 1 kit (100 g total) by mouth as directed. See written handout Patient not taking: Reported on 12/20/2016 12/26/10   Milus Banister, MD  timolol (TIMOPTIC) 0.5 % ophthalmic solution Use 1 drop in both eyes every day 11/14/10   [provider]  XARELTO 15 MG TABS tablet TAKE 1 TABLET BY MOUTH TWICE A DAY FOR 14 DAYS 11/05/16   [provider]    Family History Family History  Problem Relation Age of Onset  . Kidney disease Mother   . Colon cancer Neg Hx     Social History Social History   Tobacco Use  . Smoking status: Former Research scientist (life sciences)  . Smokeless tobacco: Never Used  Substance Use Topics  . Alcohol use: No  . Drug use: No     Allergies   Nitrofurantoin monohyd macro and Sulfa antibiotics   Review of Systems Review of Systems  All other systems reviewed and are negative.    Physical Exam Updated Vital Signs Ht '5\' 10"'$  (  1.778 m)   Wt 77.1 kg (170 lb)   BMI 24.39 kg/m   Physical Exam  Constitutional: She is oriented to person, place, and time. She appears well-developed and well-nourished. She appears distressed.  HENT:  Head: Normocephalic and atraumatic.  Neck: Normal range of motion. Neck supple.  Cardiovascular: Normal rate and regular rhythm. Exam reveals no gallop and no friction rub.  No murmur heard. Pulmonary/Chest: Effort normal and breath sounds normal. No respiratory distress. She has no wheezes.  Abdominal: Soft. Bowel sounds are normal. She exhibits no distension. There is no tenderness.  Musculoskeletal: Normal range of motion.  Neurological: She is alert and oriented to person, place, and time.  Skin: Skin is warm. She is diaphoretic.   Nursing note and vitals reviewed.    ED Treatments / Results  Labs (all labs ordered are listed, but only abnormal results are displayed) Labs Reviewed  CBC WITH DIFFERENTIAL/PLATELET  PROTIME-INR  APTT  COMPREHENSIVE METABOLIC PANEL  TROPONIN I  LIPID PANEL    EKG  EKG Interpretation  Date/Time:  Thursday May 01 2017 06:37:18 EST Ventricular Rate:  62 PR Interval:    QRS Duration: 91 QT Interval:  449 QTC Calculation: 456 R Axis:   56 Text Interpretation:  Sinus rhythm Acute MI Confirmed by Veryl Speak [phone]) on 05/01/2017 6:41:32 AM       Radiology No results found.  Procedures Procedures (including critical care time)  Medications Ordered in ED Medications  0.9 %  sodium chloride infusion (not administered)  heparin injection 60 Units/kg (not administered)     Initial Impression / Assessment and Plan / ED Course  I have reviewed the triage vital signs and the nursing notes.  Pertinent labs & imaging results that were available during my care of the patient were reviewed by me and considered in my medical decision making (see chart for details).  Patient arrived as a code STEMI with ongoing pain.  Her EKG reveals ST elevations in 2, 3, and AVF with reciprocal changes in 1 and aVL, diagnostic for an acute inferior wall MI.  A code STEMI was called and the patient has been evaluated by Dr. Angelena Form.  She will go to the Cath Lab.  CRITICAL CARE Performed by: Veryl Speak Total critical care time: 30 minutes Critical care time was exclusive of separately billable procedures and treating other patients. Critical care was necessary to treat or prevent imminent or life-threatening deterioration. Critical care was time spent personally by me on the following activities: development of treatment plan with patient and/or surrogate as well as nursing, discussions with consultants, evaluation of patient's response to treatment, examination of patient, obtaining  history from patient or surrogate, ordering and performing treatments and interventions, ordering and review of laboratory studies, ordering and review of radiographic studies, pulse oximetry and re-evaluation of patient's condition.   Final Clinical Impressions(s) / ED Diagnoses   Final diagnoses:  None    ED Discharge Orders    None       Veryl Speak, MD 05/01/17 [phone]

## 2017-05-02 ENCOUNTER — Encounter (HOSPITAL_COMMUNITY)
Admission: EM | Disposition: A | Discharge status: Home / Self Care | Payer: Self-pay | Source: Home / Self Care | Attending: Cardiovascular Disease

## 2017-05-02 HISTORY — PX: CORONARY THROMBECTOMY: CATH118304

## 2017-05-02 HISTORY — PX: CORONARY BALLOON ANGIOPLASTY: CATH118233

## 2017-05-02 HISTORY — PX: CORONARY ANGIOGRAPHY: CATH118303

## 2017-05-02 LAB — BASIC METABOLIC PANEL
Anion gap: 9 (ref 5–15)
BUN: 14 mg/dL (ref 6–20)
CHLORIDE: 107 mmol/L (ref 101–111)
CO2: 22 mmol/L (ref 22–32)
Calcium: 9 mg/dL (ref 8.9–10.3)
Creatinine, Ser: 0.92 mg/dL (ref 0.44–1.00)
GFR calc Af Amer: >60 mL/min (ref >60)
GFR calc non Af Amer: >60 mL/min (ref >60)
Glucose, Bld: 125 mg/dL — ABNORMAL HIGH (ref 65–99)
POTASSIUM: 4.1 mmol/L (ref 3.5–5.1)
SODIUM: 138 mmol/L (ref 135–145)

## 2017-05-02 LAB — POCT ACTIVATED CLOTTING TIME
ACTIVATED CLOTTING TIME: 230 s
ACTIVATED CLOTTING TIME: 235 s

## 2017-05-02 LAB — CBC
HCT: 26 % — ABNORMAL LOW (ref 36.0–46.0)
HEMATOCRIT: 27.3 % — AB (ref 36.0–46.0)
HEMOGLOBIN: 8.1 g/dL — AB (ref 12.0–15.0)
Hemoglobin: 7.6 g/dL — ABNORMAL LOW (ref 12.0–15.0)
MCH: 22 pg — ABNORMAL LOW (ref 26.0–34.0)
MCH: 21.3 pg — ABNORMAL LOW (ref 26.0–34.0)
MCHC: 29.7 g/dL — ABNORMAL LOW (ref 30.0–36.0)
MCHC: 29.2 g/dL — ABNORMAL LOW (ref 30.0–36.0)
MCV: 74.2 fL — AB (ref 78.0–100.0)
MCV: 72.8 fL — ABNORMAL LOW (ref 78.0–100.0)
PLATELETS: 215 10*3/uL (ref 150–400)
Platelets: 248 10*3/uL (ref 150–400)
RBC: 3.68 MIL/uL — ABNORMAL LOW (ref 3.87–5.11)
RBC: 3.57 MIL/uL — AB (ref 3.87–5.11)
RDW: 22 % — ABNORMAL HIGH (ref 11.5–15.5)
RDW: 21.1 % — AB (ref 11.5–15.5)
WBC: 7.4 10*3/uL (ref 4.0–10.5)
WBC: 7.2 10*3/uL (ref 4.0–10.5)

## 2017-05-02 LAB — MRSA PCR SCREENING: MRSA by PCR: NEGATIVE

## 2017-05-02 SURGERY — CORONARY ANGIOGRAPHY (CATH LAB)
Anesthesia: LOCAL

## 2017-05-02 MED ORDER — NITROGLYCERIN 1 MG/10 ML FOR IR/CATH LAB
INTRA_ARTERIAL | Status: DC | PRN
  Administered 2017-05-02: 200 ug via INTRACORONARY

## 2017-05-02 MED ORDER — SODIUM CHLORIDE 0.9 % IV SOLN: 250.0000 mL | INTRAVENOUS | Status: DC | PRN

## 2017-05-02 MED ORDER — TIROFIBAN HCL IV 12.5 MG/250 ML
0.0750 ug/kg/min | INTRAVENOUS | Status: AC
  Administered 2017-05-02: 0.075 ug/kg/min via INTRAVENOUS
  Filled 2017-05-02: qty 250

## 2017-05-02 MED ORDER — VERAPAMIL HCL 2.5 MG/ML IV SOLN
INTRAVENOUS | Status: DC | PRN
  Administered 2017-05-02: 10 mL via INTRA_ARTERIAL

## 2017-05-02 MED ORDER — VERAPAMIL HCL 2.5 MG/ML IV SOLN
INTRAVENOUS | Status: AC
  Filled 2017-05-02: qty 2

## 2017-05-02 MED ORDER — SODIUM CHLORIDE 0.9 % IV SOLN
INTRAVENOUS | Status: AC
  Administered 2017-05-02: 12:00:00 via INTRAVENOUS

## 2017-05-02 MED ORDER — ASPIRIN 81 MG PO CHEW: 81.0000 mg | CHEWABLE_TABLET | ORAL | Status: AC

## 2017-05-02 MED ORDER — HYDRALAZINE HCL 20 MG/ML IJ SOLN: 5.0000 mg | INTRAMUSCULAR | Status: AC | PRN

## 2017-05-02 MED ORDER — NITROGLYCERIN 1 MG/10 ML FOR IR/CATH LAB
INTRA_ARTERIAL | Status: AC
  Filled 2017-05-02: qty 10

## 2017-05-02 MED ORDER — SODIUM CHLORIDE 0.9% FLUSH: 3.0000 mL | INTRAVENOUS | Status: DC | PRN

## 2017-05-02 MED ORDER — HEPARIN SODIUM (PORCINE) 1000 UNIT/ML IJ SOLN
INTRAMUSCULAR | Status: AC
  Filled 2017-05-02: qty 1

## 2017-05-02 MED ORDER — MIDAZOLAM HCL 2 MG/2ML IJ SOLN
INTRAMUSCULAR | Status: AC
  Filled 2017-05-02: qty 2

## 2017-05-02 MED ORDER — SODIUM CHLORIDE 0.9 % WEIGHT BASED INFUSION: 3.0000 mL/kg/h | INTRAVENOUS | Status: DC

## 2017-05-02 MED ORDER — SODIUM CHLORIDE 0.9 % WEIGHT BASED INFUSION
1.0000 mL/kg/h | INTRAVENOUS | Status: DC
  Administered 2017-05-02: 1 mL/kg/h via INTRAVENOUS

## 2017-05-02 MED ORDER — IOPAMIDOL (ISOVUE-370) INJECTION 76%
INTRAVENOUS | Status: AC
  Filled 2017-05-02: qty 100

## 2017-05-02 MED ORDER — HEPARIN (PORCINE) IN NACL 2-0.9 UNIT/ML-% IJ SOLN
INTRAMUSCULAR | Status: AC
  Filled 2017-05-02: qty 500

## 2017-05-02 MED ORDER — SODIUM CHLORIDE 0.9% FLUSH
3.0000 mL | Freq: Two times a day (BID) | INTRAVENOUS | Status: DC
  Administered 2017-05-02: 3 mL via INTRAVENOUS

## 2017-05-02 MED ORDER — LIDOCAINE HCL (PF) 1 % IJ SOLN
INTRAMUSCULAR | Status: DC | PRN
  Administered 2017-05-02: 2 mL

## 2017-05-02 MED ORDER — IOPAMIDOL (ISOVUE-370) INJECTION 76%
INTRAVENOUS | Status: DC | PRN
  Administered 2017-05-02: 170 mL via INTRA_ARTERIAL

## 2017-05-02 MED ORDER — SODIUM CHLORIDE 0.9% FLUSH
3.0000 mL | Freq: Two times a day (BID) | INTRAVENOUS | Status: DC
  Administered 2017-05-02 – 2017-05-05 (×7): 3 mL via INTRAVENOUS

## 2017-05-02 MED ORDER — FENTANYL CITRATE (PF) 100 MCG/2ML IJ SOLN
INTRAMUSCULAR | Status: DC | PRN
  Administered 2017-05-02: 25 ug via INTRAVENOUS

## 2017-05-02 MED ORDER — HEPARIN SODIUM (PORCINE) 5000 UNIT/ML IJ SOLN
5000.0000 [IU] | Freq: Three times a day (TID) | INTRAMUSCULAR | Status: DC
  Administered 2017-05-02 – 2017-05-05 (×9): 5000 [IU] via SUBCUTANEOUS
  Filled 2017-05-02 (×8): qty 1

## 2017-05-02 MED ORDER — LIDOCAINE HCL (PF) 1 % IJ SOLN
INTRAMUSCULAR | Status: AC
  Filled 2017-05-02: qty 30

## 2017-05-02 MED ORDER — MIDAZOLAM HCL 2 MG/2ML IJ SOLN
INTRAMUSCULAR | Status: DC | PRN
  Administered 2017-05-02: 1 mg via INTRAVENOUS

## 2017-05-02 MED ORDER — LABETALOL HCL 5 MG/ML IV SOLN: 10.0000 mg | INTRAVENOUS | Status: AC | PRN

## 2017-05-02 MED ORDER — SODIUM CHLORIDE 0.9% FLUSH
3.0000 mL | INTRAVENOUS | Status: DC | PRN
Start: 2017-05-02 — End: 2017-05-05

## 2017-05-02 MED ORDER — FENTANYL CITRATE (PF) 100 MCG/2ML IJ SOLN
INTRAMUSCULAR | Status: AC
  Filled 2017-05-02: qty 2

## 2017-05-02 MED ORDER — HEPARIN (PORCINE) IN NACL 2-0.9 UNIT/ML-% IJ SOLN
INTRAMUSCULAR | Status: AC | PRN
  Administered 2017-05-02: 1000 mL

## 2017-05-02 MED ORDER — HEPARIN SODIUM (PORCINE) 1000 UNIT/ML IJ SOLN
INTRAMUSCULAR | Status: DC | PRN
  Administered 2017-05-02: 4000 [IU] via INTRAVENOUS
  Administered 2017-05-02: 3000 [IU] via INTRAVENOUS
  Administered 2017-05-02: 2000 [IU] via INTRAVENOUS

## 2017-05-02 MED FILL — Atropine Sulfate Soln Prefill Syr 1 MG/10ML (0.1 MG/ML): INTRAMUSCULAR | Qty: 10 | Status: AC

## 2017-05-02 SURGICAL SUPPLY — 21 items
BALLN EMERGE MR 2.25X12 (BALLOONS) ×2
BALLN EMERGE MR PUSH 1.5X12 (BALLOONS) ×2
BALLOON EMERGE MR 2.25X12 (BALLOONS) IMPLANT
BALLOON EMERGE MR PUSH 1.5X12 (BALLOONS) IMPLANT
CATH EXTRAC PRONTO 5.5F 138CM (CATHETERS) ×1 IMPLANT
CATH INFINITI 5FR AL1 (CATHETERS) ×1 IMPLANT
CATH LAUNCHER 6FR AL1 (CATHETERS) IMPLANT
CATHETER LAUNCHER 6FR AL1 (CATHETERS) ×2
COVER PRB 48X5XTLSCP FOLD TPE (BAG) IMPLANT
COVER PROBE 5X48 (BAG) ×2
DEVICE RAD COMP TR BAND LRG (VASCULAR PRODUCTS) ×1 IMPLANT
GLIDESHEATH SLEND A-KIT 6F 22G (SHEATH) ×1 IMPLANT
GUIDEWIRE INQWIRE 1.5J.035X260 (WIRE) IMPLANT
INQWIRE 1.5J .035X260CM (WIRE) ×2
KIT ENCORE 26 ADVANTAGE (KITS) ×1 IMPLANT
KIT HEART LEFT (KITS) ×2 IMPLANT
PACK CARDIAC CATHETERIZATION (CUSTOM PROCEDURE TRAY) ×2 IMPLANT
TRANSDUCER W/STOPCOCK (MISCELLANEOUS) ×2 IMPLANT
TUBING CIL FLEX 10 FLL-RA (TUBING) ×2 IMPLANT
WIRE ASAHI MIRACLEBROS-3 180CM (WIRE) ×1 IMPLANT
WIRE HI TORQ WHISPER MS 190CM (WIRE) ×1 IMPLANT

## 2017-05-02 NOTE — Interval H&P Note (Signed)
History and Physical Interval Note:  05/02/2017 9:52 AM  Jenna Thompson  has presented today for surgery, with the diagnosis of STEMI with continued chest pain following PTCA. The various methods of treatment have been discussed with the patient and family. After consideration of risks, benefits and other options for treatment, the patient has consented to  Procedure(s): LEFT HEART CATH AND CORONARY ANGIOGRAPHY (N/A) as a surgical intervention .  The patient's history has been reviewed, patient examined, no change in status, stable for surgery.  I have reviewed the patient's chart and labs.  Questions were answered to the patient's satisfaction.    Cath Lab Visit (complete for each Cath Lab visit)  Clinical Evaluation Leading to the Procedure:   ACS: Yes.    Non-ACS:  N/A  Haleigh Desmith

## 2017-05-02 NOTE — Brief Op Note (Signed)
Brief Cardiac Catheterization Note  Date: 05/02/2017 Time: 11:36 AM  PATIENT:  Jenna Thompson  72 y.o. female  PRE-OPERATIVE DIAGNOSIS:  STEMI s/p PTCA with recurrent chest pain  POST-OPERATIVE DIAGNOSIS:  Thrombotic occlusion of PDA  PROCEDURE:  Procedure(s): LEFT HEART CATH AND CORONARY ANGIOGRAPHY (N/A) Coronary Thrombectomy (N/A) CORONARY BALLOON ANGIOPLASTY (N/A)  SURGEON:  Surgeon(s) and Role:    * Geanine Vandekamp, MD - Primary  FINDINGS: 1. Stable appearance of left and right coronary arteries since yesterday, including occluded rPDA. Distal branches now fill faintly via collaterals. 2. PTCA and aspiration thrombectomy to rPDA with removal of large thrombus and restoration of flow in the proximal/mid rPDA. Residual thrombus remains in the distal vessel and is too distal for intervention.  RECOMMENDATIONS: 1. Continue tirofiban infusion for 18 hours, as hemoglobin tolerates given baseline anemia. 2. Aggressive secondary prevention.  Yvonne Kendall, MD [facility] Pager: [phone]

## 2017-05-03 ENCOUNTER — Other Ambulatory Visit: Payer: Self-pay

## 2017-05-03 DIAGNOSIS — D649 Anemia, unspecified: Secondary | ICD-10-CM

## 2017-05-03 LAB — OCCULT BLOOD X 1 CARD TO LAB, STOOL: FECAL OCCULT BLD: NEGATIVE

## 2017-05-03 LAB — CBC
HCT: 25.9 % — ABNORMAL LOW (ref 36.0–46.0)
HEMOGLOBIN: 7.5 g/dL — AB (ref 12.0–15.0)
MCH: 21.1 pg — AB (ref 26.0–34.0)
MCHC: 29 g/dL — AB (ref 30.0–36.0)
MCV: 72.8 fL — ABNORMAL LOW (ref 78.0–100.0)
Platelets: 198 10*3/uL (ref 150–400)
RBC: 3.56 MIL/uL — AB (ref 3.87–5.11)
RDW: 21.2 % — ABNORMAL HIGH (ref 11.5–15.5)
WBC: 6.5 10*3/uL (ref 4.0–10.5)

## 2017-05-03 LAB — BASIC METABOLIC PANEL
ANION GAP: 10 (ref 5–15)
BUN: 10 mg/dL (ref 6–20)
CALCIUM: 8.7 mg/dL — AB (ref 8.9–10.3)
CO2: 18 mmol/L — AB (ref 22–32)
CREATININE: 0.88 mg/dL (ref 0.44–1.00)
Chloride: 109 mmol/L (ref 101–111)
GLUCOSE: 117 mg/dL — AB (ref 65–99)
Potassium: 3.3 mmol/L — ABNORMAL LOW (ref 3.5–5.1)
Sodium: 137 mmol/L (ref 135–145)

## 2017-05-03 MED ORDER — POTASSIUM CHLORIDE CRYS ER 20 MEQ PO TBCR
40.0000 meq | EXTENDED_RELEASE_TABLET | ORAL | Status: AC
  Administered 2017-05-03 (×2): 40 meq via ORAL
  Filled 2017-05-03 (×2): qty 2

## 2017-05-03 MED ORDER — FERROUS GLUCONATE 324 (38 FE) MG PO TABS
324.0000 mg | ORAL_TABLET | Freq: Every day | ORAL | Status: DC
  Administered 2017-05-04 – 2017-05-05 (×2): 324 mg via ORAL
  Filled 2017-05-03 (×2): qty 1

## 2017-05-03 NOTE — Progress Notes (Signed)
CARDIAC REHAB PHASE I   PRE:  Rate/Rhythm: 73 SR    BP: sitting 127/85    SaO2: 98 RA  MODE:  Ambulation: 370 ft   POST:  Rate/Rhythm: 95 SR    BP: sitting 130/89     SaO2: 99 RA  Tolerated well, no c/o walking except fatigue toward end. VSS. She rested for 30 min while family visited and then I came back for education. She c/o right CP, "might be indigestion". RN gave Mylanta which she stated it was helping. Ed completed. Understands Brilinta. Will refer to [place] [facility]. Encouraged more walking.  [id number]   Harriet Masson CES, ACSM 05/03/2017 3:26 PM

## 2017-05-03 NOTE — Progress Notes (Signed)
DAILY PROGRESS NOTE   Patient Name: Jenna Thompson Date of Encounter: 05/03/2017  Chief Complaint   Chest pressure   Patient Profile   72 yo female with history of OA, hiatal hernia, HTN, prior DVT who presented to the ED via EMS this morning with c/o chest pain. EKG with 2-3 mm inferior ST elevation. Code STEMI called by EMS. Pt with ongoing pain in the ED. Her pain woke her this am. The pain radiated to her arms. Associated dyspnea, nausea and diaphoresis.  Subjective   Chest pressure persists today. She presented with mid to distal RCA STEMI s/p PTCA, angioplasty and thrombotic aspiration. She went back for re-look cath yesterday and had repeat thrombectomy with establishment of flow in the proximal rPDA, however, the distal vessel remained occluded with thrombus and was too small for intervention. EKG today shows evolving inferior infarct with minimal ST elevation in II, III and AVF and T wave inversions. Hemoglobin is mildly reduced today at 7.5 (was 8.1) yesterday. Potassium 3.3 today.  Objective   Vitals:   05/03/17 1155 05/03/17 1200 05/03/17 1300 05/03/17 1400  BP: 134/80  (!) 134/92 (!) 147/83  Pulse:      Resp:      Temp: 98.3 F (36.8 C)     TempSrc: Oral     SpO2: 98% 97% 100% 100%  Weight:      Height:        Intake/Output Summary (Last 24 hours) at 05/03/2017 1444 Last data filed at 05/03/2017 1200 Gross per 24 hour  Intake 1134 ml  Output 752 ml  Net 382 ml   Filed Weights   05/01/17 0845 05/01/17 0920 05/02/17 0600  Weight: 186 lb 1.1 oz (84.4 kg) 186 lb 1.1 oz (84.4 kg) 186 lb 1.1 oz (84.4 kg)    Physical Exam   General appearance: alert and no distress Neck: no carotid bruit, no JVD and thyroid not enlarged, symmetric, no tenderness/mass/nodules Lungs: clear to auscultation bilaterally Heart: regular rate and rhythm Abdomen: soft, non-tender; bowel sounds normal; no masses,  no organomegaly Extremities: extremities normal, atraumatic, no cyanosis  or edema Pulses: 2+ and symmetric Skin: Skin color, texture, turgor normal. No rashes or lesions Neurologic: Grossly normal Psych: Pleasant  Inpatient Medications    Scheduled Meds: . aspirin  81 mg Oral Daily  . atorvastatin  80 mg Oral q1800  . heparin  5,000 Units Subcutaneous Q8H  . metoprolol tartrate  25 mg Oral BID  . sodium chloride flush  3 mL Intravenous Q12H  . sodium chloride flush  3 mL Intravenous Q12H  . ticagrelor  90 mg Oral BID  . timolol  1 drop Both Eyes Daily    Continuous Infusions: . sodium chloride    . nitroGLYCERIN Stopped (05/02/17 1253)    PRN Meds: sodium chloride, acetaminophen, alum & mag hydroxide-simeth, morphine injection, ondansetron (ZOFRAN) IV, sodium chloride flush, sodium chloride flush, zolpidem   Labs   Results for orders placed or performed during the hospital encounter of 05/01/17 (from the past 48 hour(s))  Troponin I (serum)     Status: Abnormal   Collection Time: 05/01/17  8:58 PM  Result Value Ref Range   Troponin I 40.81 (HH) <0.03 ng/mL    Comment: CRITICAL VALUE NOTED.  VALUE IS CONSISTENT WITH PREVIOUSLY REPORTED AND CALLED VALUE.  MRSA PCR Screening     Status: None   Collection Time: 05/01/17 10:39 PM  Result Value Ref Range   MRSA by PCR NEGATIVE NEGATIVE  Comment:        The GeneXpert MRSA Assay (FDA approved for NASAL specimens only), is one component of a comprehensive MRSA colonization surveillance program. It is not intended to diagnose MRSA infection nor to guide or monitor treatment for MRSA infections.   Basic metabolic panel     Status: Abnormal   Collection Time: 05/02/17  3:39 AM  Result Value Ref Range   Sodium 138 135 - 145 mmol/L   Potassium 4.1 3.5 - 5.1 mmol/L   Chloride 107 101 - 111 mmol/L   CO2 22 22 - 32 mmol/L   Glucose, Bld 125 (H) 65 - 99 mg/dL   BUN 14 6 - 20 mg/dL   Creatinine, Ser 0.92 0.44 - 1.00 mg/dL   Calcium 9.0 8.9 - 10.3 mg/dL   GFR calc non Af Amer >60 >60 mL/min    GFR calc Af Amer >60 >60 mL/min    Comment: (NOTE) The eGFR has been calculated using the CKD EPI equation. This calculation has not been validated in all clinical situations. eGFR's persistently <60 mL/min signify possible Chronic Kidney Disease.    Anion gap 9 5 - 15  CBC     Status: Abnormal   Collection Time: 05/02/17  3:39 AM  Result Value Ref Range   WBC 7.4 4.0 - 10.5 K/uL    Comment: REPEATED TO VERIFY WHITE COUNT CONFIRMED ON SMEAR    RBC 3.68 (L) 3.87 - 5.11 MIL/uL   Hemoglobin 8.1 (L) 12.0 - 15.0 g/dL   HCT 27.3 (L) 36.0 - 46.0 %   MCV 74.2 (L) 78.0 - 100.0 fL   MCH 22.0 (L) 26.0 - 34.0 pg   MCHC 29.7 (L) 30.0 - 36.0 g/dL   RDW 22.0 (H) 11.5 - 15.5 %   Platelets 248 150 - 400 K/uL    Comment: REPEATED TO VERIFY SPECIMEN CHECKED FOR CLOTS PLATELET COUNT CONFIRMED BY SMEAR   POCT Activated clotting time     Status: None   Collection Time: 05/02/17 10:34 AM  Result Value Ref Range   Activated Clotting Time 230 seconds  POCT Activated clotting time     Status: None   Collection Time: 05/02/17 11:15 AM  Result Value Ref Range   Activated Clotting Time 235 seconds  CBC     Status: Abnormal   Collection Time: 05/02/17  8:51 PM  Result Value Ref Range   WBC 7.2 4.0 - 10.5 K/uL   RBC 3.57 (L) 3.87 - 5.11 MIL/uL   Hemoglobin 7.6 (L) 12.0 - 15.0 g/dL   HCT 26.0 (L) 36.0 - 46.0 %   MCV 72.8 (L) 78.0 - 100.0 fL   MCH 21.3 (L) 26.0 - 34.0 pg   MCHC 29.2 (L) 30.0 - 36.0 g/dL   RDW 21.1 (H) 11.5 - 15.5 %   Platelets 215 150 - 400 K/uL  Basic metabolic panel     Status: Abnormal   Collection Time: 05/03/17  3:20 AM  Result Value Ref Range   Sodium 137 135 - 145 mmol/L   Potassium 3.3 (L) 3.5 - 5.1 mmol/L   Chloride 109 101 - 111 mmol/L   CO2 18 (L) 22 - 32 mmol/L   Glucose, Bld 117 (H) 65 - 99 mg/dL   BUN 10 6 - 20 mg/dL   Creatinine, Ser 0.88 0.44 - 1.00 mg/dL   Calcium 8.7 (L) 8.9 - 10.3 mg/dL   GFR calc non Af Amer >60 >60 mL/min   GFR calc Af Amer >60 >60  mL/min    Comment: (NOTE) The eGFR has been calculated using the CKD EPI equation. This calculation has not been validated in all clinical situations. eGFR's persistently <60 mL/min signify possible Chronic Kidney Disease.    Anion gap 10 5 - 15  CBC     Status: Abnormal   Collection Time: 05/03/17  3:20 AM  Result Value Ref Range   WBC 6.5 4.0 - 10.5 K/uL    Comment: WHITE COUNT CONFIRMED ON SMEAR   RBC 3.56 (L) 3.87 - 5.11 MIL/uL   Hemoglobin 7.5 (L) 12.0 - 15.0 g/dL   HCT 25.9 (L) 36.0 - 46.0 %   MCV 72.8 (L) 78.0 - 100.0 fL   MCH 21.1 (L) 26.0 - 34.0 pg   MCHC 29.0 (L) 30.0 - 36.0 g/dL   RDW 21.2 (H) 11.5 - 15.5 %   Platelets 198 150 - 400 K/uL    Comment: PLATELET COUNT CONFIRMED BY SMEAR    ECG   NSR with mild inferior ST elevation, TWI's - evolving inferior STEMI - Personally Reviewed  Telemetry   Sinus rhythm - Personally Reviewed  Radiology    No results found.  Cardiac Studies   N/A  Assessment   1. Active Problems: 2.   ST elevation myocardial infarction (STEMI) of inferior wall ([place]) 3. Anemia  Plan   1. Mild chest discomfort today - may be angina, worse after walking. EKG shows evolving inferior MI. No indication to go back to cath - will try medication. She has been progressively anemic since admission. This is a long-standing problem for her. Restart home iron. Check stool guaiac. Hold on transfusion unless Hb<7. Ok to continue SQ heparin, ASA and Brilinta. Replete potassium.  Time Spent Directly with Patient:  I have spent a total of 35 minutes with the patient reviewing hospital notes, telemetry, EKGs, labs and examining the patient as well as establishing an assessment and plan that was discussed personally with the patient. > 50% of time was spent in direct patient care.  Length of Stay:  LOS: 2 days   Pixie Casino, MD, [place], [place] Director of the [facility] Lipid [facility] &  Cardiovascular [facility]  Red[facility] Diplomate of the American Board of Clinical Lipidology Attending Cardiologist  Direct Dial: [phone]  Fax: [phone]  Website:  [email or web address] 05/03/2017, 2:44 PM

## 2017-05-04 DIAGNOSIS — D509 Iron deficiency anemia, unspecified: Secondary | ICD-10-CM

## 2017-05-04 DIAGNOSIS — Z9861 Coronary angioplasty status: Secondary | ICD-10-CM

## 2017-05-04 LAB — CBC
HCT: 26.1 % — ABNORMAL LOW (ref 36.0–46.0)
HEMOGLOBIN: 7.8 g/dL — AB (ref 12.0–15.0)
MCH: 22 pg — AB (ref 26.0–34.0)
MCHC: 29.9 g/dL — ABNORMAL LOW (ref 30.0–36.0)
MCV: 73.7 fL — AB (ref 78.0–100.0)
Platelets: 246 10*3/uL (ref 150–400)
RBC: 3.54 MIL/uL — AB (ref 3.87–5.11)
RDW: 21.8 % — ABNORMAL HIGH (ref 11.5–15.5)
WBC: 5.5 10*3/uL (ref 4.0–10.5)

## 2017-05-04 LAB — BASIC METABOLIC PANEL
ANION GAP: 9 (ref 5–15)
BUN: 14 mg/dL (ref 6–20)
CHLORIDE: 109 mmol/L (ref 101–111)
CO2: 20 mmol/L — ABNORMAL LOW (ref 22–32)
Calcium: 8.7 mg/dL — ABNORMAL LOW (ref 8.9–10.3)
Creatinine, Ser: 0.99 mg/dL (ref 0.44–1.00)
GFR calc Af Amer: >60 mL/min (ref >60)
GFR calc non Af Amer: 56 mL/min — ABNORMAL LOW (ref >60)
Glucose, Bld: 92 mg/dL (ref 65–99)
Potassium: 4 mmol/L (ref 3.5–5.1)
Sodium: 138 mmol/L (ref 135–145)

## 2017-05-04 LAB — MAGNESIUM: MAGNESIUM: 2 mg/dL (ref 1.7–2.4)

## 2017-05-04 MED ORDER — LOPERAMIDE HCL 2 MG PO CAPS
2.0000 mg | ORAL_CAPSULE | ORAL | Status: DC | PRN
  Administered 2017-05-04 – 2017-05-05 (×3): 2 mg via ORAL
  Filled 2017-05-04 (×3): qty 1

## 2017-05-04 NOTE — Progress Notes (Signed)
DAILY PROGRESS NOTE   Patient Name: Jenna Thompson Date of Encounter: 05/04/2017  Chief Complaint   Feels better today  Patient Profile   72 yo female with history of OA, hiatal hernia, HTN, prior DVT who presented to the ED via EMS this morning with c/o chest pain. EKG with 2-3 mm inferior ST elevation. Code STEMI called by EMS. Pt with ongoing pain in the ED. Her pain woke her this am. The pain radiated to her arms. Associated dyspnea, nausea and diaphoresis.  Subjective   No chest pressure today. Potassium is now normal. Hemoglobin stable overnight. Stool guaiac negative.   Objective   Vitals:   05/04/17 0600 05/04/17 0700 05/04/17 0741 05/04/17 0800  BP: 131/77 (!) 120/91  140/88  Pulse:      Resp: (!) 24   (!) 24  Temp:   98.8 F (37.1 C)   TempSrc:      SpO2: 95% 99%  96%  Weight:      Height:        Intake/Output Summary (Last 24 hours) at 05/04/2017 0831 Last data filed at 05/03/2017 1900 Gross per 24 hour  Intake 720 ml  Output -  Net 720 ml   Filed Weights   05/01/17 0845 05/01/17 0920 05/02/17 0600  Weight: 186 lb 1.1 oz (84.4 kg) 186 lb 1.1 oz (84.4 kg) 186 lb 1.1 oz (84.4 kg)    Physical Exam   General appearance: alert and no distress Neck: no carotid bruit, no JVD and thyroid not enlarged, symmetric, no tenderness/mass/nodules Lungs: clear to auscultation bilaterally Heart: regular rate and rhythm Abdomen: soft, non-tender; bowel sounds normal; no masses,  no organomegaly Extremities: extremities normal, atraumatic, no cyanosis or edema Pulses: 2+ and symmetric Skin: Skin color, texture, turgor normal. No rashes or lesions Neurologic: Grossly normal Psych: Pleasant  Inpatient Medications    Scheduled Meds: . aspirin  81 mg Oral Daily  . atorvastatin  80 mg Oral q1800  . ferrous gluconate  324 mg Oral Q breakfast  . heparin  5,000 Units Subcutaneous Q8H  . metoprolol tartrate  25 mg Oral BID  . sodium chloride flush  3 mL Intravenous Q12H   . ticagrelor  90 mg Oral BID  . timolol  1 drop Both Eyes Daily    Continuous Infusions: . sodium chloride    . nitroGLYCERIN Stopped (05/02/17 1253)    PRN Meds: sodium chloride, acetaminophen, alum & mag hydroxide-simeth, loperamide, morphine injection, ondansetron (ZOFRAN) IV, sodium chloride flush, zolpidem   Labs   Results for orders placed or performed during the hospital encounter of 05/01/17 (from the past 48 hour(s))  POCT Activated clotting time     Status: None   Collection Time: 05/02/17 10:34 AM  Result Value Ref Range   Activated Clotting Time 230 seconds  POCT Activated clotting time     Status: None   Collection Time: 05/02/17 11:15 AM  Result Value Ref Range   Activated Clotting Time 235 seconds  CBC     Status: Abnormal   Collection Time: 05/02/17  8:51 PM  Result Value Ref Range   WBC 7.2 4.0 - 10.5 K/uL   RBC 3.57 (L) 3.87 - 5.11 MIL/uL   Hemoglobin 7.6 (L) 12.0 - 15.0 g/dL   HCT 26.0 (L) 36.0 - 46.0 %   MCV 72.8 (L) 78.0 - 100.0 fL   MCH 21.3 (L) 26.0 - 34.0 pg   MCHC 29.2 (L) 30.0 - 36.0 g/dL   RDW 21.1 (  H) 11.5 - 15.5 %   Platelets 215 150 - 400 K/uL  Basic metabolic panel     Status: Abnormal   Collection Time: 05/03/17  3:20 AM  Result Value Ref Range   Sodium 137 135 - 145 mmol/L   Potassium 3.3 (L) 3.5 - 5.1 mmol/L   Chloride 109 101 - 111 mmol/L   CO2 18 (L) 22 - 32 mmol/L   Glucose, Bld 117 (H) 65 - 99 mg/dL   BUN 10 6 - 20 mg/dL   Creatinine, Ser 0.88 0.44 - 1.00 mg/dL   Calcium 8.7 (L) 8.9 - 10.3 mg/dL   GFR calc non Af Amer >60 >60 mL/min   GFR calc Af Amer >60 >60 mL/min    Comment: (NOTE) The eGFR has been calculated using the CKD EPI equation. This calculation has not been validated in all clinical situations. eGFR's persistently <60 mL/min signify possible Chronic Kidney Disease.    Anion gap 10 5 - 15  CBC     Status: Abnormal   Collection Time: 05/03/17  3:20 AM  Result Value Ref Range   WBC 6.5 4.0 - 10.5 K/uL     Comment: WHITE COUNT CONFIRMED ON SMEAR   RBC 3.56 (L) 3.87 - 5.11 MIL/uL   Hemoglobin 7.5 (L) 12.0 - 15.0 g/dL   HCT 25.9 (L) 36.0 - 46.0 %   MCV 72.8 (L) 78.0 - 100.0 fL   MCH 21.1 (L) 26.0 - 34.0 pg   MCHC 29.0 (L) 30.0 - 36.0 g/dL   RDW 21.2 (H) 11.5 - 15.5 %   Platelets 198 150 - 400 K/uL    Comment: PLATELET COUNT CONFIRMED BY SMEAR  Occult blood card to lab, stool     Status: None   Collection Time: 05/03/17  7:41 PM  Result Value Ref Range   Fecal Occult Bld NEGATIVE NEGATIVE  Basic metabolic panel     Status: Abnormal   Collection Time: 05/04/17  3:30 AM  Result Value Ref Range   Sodium 138 135 - 145 mmol/L   Potassium 4.0 3.5 - 5.1 mmol/L   Chloride 109 101 - 111 mmol/L   CO2 20 (L) 22 - 32 mmol/L   Glucose, Bld 92 65 - 99 mg/dL   BUN 14 6 - 20 mg/dL   Creatinine, Ser 0.99 0.44 - 1.00 mg/dL   Calcium 8.7 (L) 8.9 - 10.3 mg/dL   GFR calc non Af Amer 56 (L) >60 mL/min   GFR calc Af Amer >60 >60 mL/min    Comment: (NOTE) The eGFR has been calculated using the CKD EPI equation. This calculation has not been validated in all clinical situations. eGFR's persistently <60 mL/min signify possible Chronic Kidney Disease.    Anion gap 9 5 - 15  CBC     Status: Abnormal   Collection Time: 05/04/17  3:30 AM  Result Value Ref Range   WBC 5.5 4.0 - 10.5 K/uL   RBC 3.54 (L) 3.87 - 5.11 MIL/uL   Hemoglobin 7.8 (L) 12.0 - 15.0 g/dL   HCT 26.1 (L) 36.0 - 46.0 %   MCV 73.7 (L) 78.0 - 100.0 fL   MCH 22.0 (L) 26.0 - 34.0 pg   MCHC 29.9 (L) 30.0 - 36.0 g/dL   RDW 21.8 (H) 11.5 - 15.5 %   Platelets 246 150 - 400 K/uL  Magnesium     Status: None   Collection Time: 05/04/17  3:30 AM  Result Value Ref Range   Magnesium 2.0 1.7 -  2.4 mg/dL    ECG   NSR with mild inferior ST elevation, TWI's - evolving inferior STEMI - Personally Reviewed  Telemetry   Sinus rhythm - Personally Reviewed  Radiology    No results found.  Cardiac Studies   N/A  Assessment   Active  Problems:   ST elevation myocardial infarction (STEMI) of inferior wall (HCC) 1. Anemia  Plan   1. Chest discomfort has improved, relieved with maalox. Ok to transfer to telemetry. Given recent STEMI, check 2D echo for LVEF - was 50-55% on initial cath, however, had a recurrent event with thrombus in the PDA, suspect she has infarcted the distal inferoapical wall.   Anticipate d/c tomorrow.  Time Spent Directly with Patient:  I have spent a total of 25 minutes with the patient reviewing hospital notes, telemetry, EKGs, labs and examining the patient as well as establishing an assessment and plan that was discussed personally with the patient. > 50% of time was spent in direct patient care.  Length of Stay:  LOS: 3 days   Pixie Casino, MD, [facility], [place] Director of the [facility]  [facility] Diplomate of the American Board of Clinical Lipidology Attending Cardiologist  Direct Dial: [phone]  Fax: [phone]  Website:  [email or web address] Elbert Spickler 05/04/2017, 8:31 AM

## 2017-05-04 NOTE — Plan of Care (Signed)
Patient ambulated 740 feet around unit, VSS and tolerated ambulation well.

## 2017-05-04 NOTE — Plan of Care (Signed)
Pt had 1 medium bowel movement that was subsequently sent for Hemocult testing d/t decr. In Hb/Hct. Test resulted negative  After 2nd PCI, Pt has no c/o SSCP, only epigastric discomfort that is relieved with PRN Mylanta  Pt tolerating get OOB to chair, bathroom privileges. Only c/o slight tiredness with ambulation 370 ft.

## 2017-05-04 NOTE — Plan of Care (Signed)
Pt. Ambulatory and independent in the room.

## 2017-05-05 ENCOUNTER — Encounter (HOSPITAL_COMMUNITY): Payer: Self-pay | Admitting: Internal Medicine

## 2017-05-05 ENCOUNTER — Inpatient Hospital Stay (HOSPITAL_COMMUNITY): Payer: Medicare Other

## 2017-05-05 DIAGNOSIS — D500 Iron deficiency anemia secondary to blood loss (chronic): Secondary | ICD-10-CM

## 2017-05-05 DIAGNOSIS — E785 Hyperlipidemia, unspecified: Secondary | ICD-10-CM

## 2017-05-05 DIAGNOSIS — I2510 Atherosclerotic heart disease of native coronary artery without angina pectoris: Secondary | ICD-10-CM

## 2017-05-05 LAB — BASIC METABOLIC PANEL
Anion gap: 8 (ref 5–15)
BUN: 13 mg/dL (ref 6–20)
CALCIUM: 8.6 mg/dL — AB (ref 8.9–10.3)
CO2: 21 mmol/L — AB (ref 22–32)
Chloride: 109 mmol/L (ref 101–111)
Creatinine, Ser: 0.98 mg/dL (ref 0.44–1.00)
GFR calc Af Amer: >60 mL/min (ref >60)
GFR, EST NON AFRICAN AMERICAN: 57 mL/min — AB (ref >60)
GLUCOSE: 104 mg/dL — AB (ref 65–99)
Potassium: 3.8 mmol/L (ref 3.5–5.1)
Sodium: 138 mmol/L (ref 135–145)

## 2017-05-05 LAB — ECHOCARDIOGRAM COMPLETE
HEIGHTINCHES: 70.5 in
WEIGHTICAEL: 2768 [oz_av]

## 2017-05-05 LAB — CBC
HCT: 26.8 % — ABNORMAL LOW (ref 36.0–46.0)
HEMOGLOBIN: 7.8 g/dL — AB (ref 12.0–15.0)
MCH: 21.3 pg — ABNORMAL LOW (ref 26.0–34.0)
MCHC: 29.1 g/dL — AB (ref 30.0–36.0)
MCV: 73.2 fL — ABNORMAL LOW (ref 78.0–100.0)
Platelets: 266 10*3/uL (ref 150–400)
RBC: 3.66 MIL/uL — ABNORMAL LOW (ref 3.87–5.11)
RDW: 21.2 % — AB (ref 11.5–15.5)
WBC: 6.2 10*3/uL (ref 4.0–10.5)

## 2017-05-05 MED ORDER — TICAGRELOR 90 MG PO TABS: 90.0000 mg | ORAL_TABLET | Freq: Two times a day (BID) | ORAL | 1 refills | Status: DC

## 2017-05-05 MED ORDER — ASPIRIN 81 MG PO CHEW: 81.0000 mg | CHEWABLE_TABLET | Freq: Every day | ORAL | Status: AC

## 2017-05-05 MED ORDER — NITROGLYCERIN 0.4 MG SL SUBL: 0.4000 mg | SUBLINGUAL_TABLET | SUBLINGUAL | 2 refills | Status: DC | PRN

## 2017-05-05 MED ORDER — FERROUS GLUCONATE 324 (38 FE) MG PO TABS: 324.0000 mg | ORAL_TABLET | Freq: Every day | ORAL | 3 refills | Status: DC

## 2017-05-05 MED ORDER — METOPROLOL TARTRATE 25 MG PO TABS: 25.0000 mg | ORAL_TABLET | Freq: Two times a day (BID) | ORAL | 2 refills | Status: DC

## 2017-05-05 MED ORDER — ATORVASTATIN CALCIUM 80 MG PO TABS: 80.0000 mg | ORAL_TABLET | Freq: Every day | ORAL | 0 refills | Status: DC

## 2017-05-05 NOTE — Progress Notes (Signed)
CARDIAC REHAB PHASE I   Pt has been walking independently without CP. She is in bed now and would prefer to stay warm. Reviewed need for Brilinta and I checked with CM, who sts Brilinta should be $8. Notified pt and reminded her to walk at home. Voiced understanding. [phone]  Harriet Masson CES, ACSM 05/05/2017 11:21 AM

## 2017-05-05 NOTE — Progress Notes (Signed)
Discharge teaching provided at bedside. Pt IV discontinued, catheter intact and telemetry removed. Pt has all belongings. Awaiting transportation

## 2017-05-05 NOTE — Progress Notes (Signed)
Pt discharged via wheelchair with NT 

## 2017-05-05 NOTE — Progress Notes (Signed)
Brilinta coupon card given to patient with instructions of usage; Alexis Goodell [phone]

## 2017-05-05 NOTE — Progress Notes (Signed)
Called 2D echo regarding pt possible discharge today and echo has yet to be completed  Echo stated she is on the list today  Pt aware

## 2017-05-05 NOTE — Progress Notes (Signed)
The patient has been seen in conjunction with Laverda Page, NP-C. All aspects of care have been considered and discussed. The patient has been personally interviewed, examined, and all clinical data has been reviewed.   The patient has had no recurrent angina.  Digital images were reviewed.  Essentially, totally occluded mid PDA with collaterals from apical LAD.  No angina with exertion.  No excessive dyspnea.  Site of prior radial catheterization procedures looks well.  No kidney impairment based upon creatinine.  Hemoglobin is slightly lower than on admission but stable.  Plan discharge today with 5-7-day hemoglobin and OV transition of care.  Could switch ticagrelor to clopidogrel for simplicity.   Progress Note  Patient Name: Jenna Thompson Date of Encounter: 05/05/2017  Primary Cardiologist: Clifton James  Subjective   Feeling well, no chest pain.   Inpatient Medications    Scheduled Meds: . aspirin  81 mg Oral Daily  . atorvastatin  80 mg Oral q1800  . ferrous gluconate  324 mg Oral Q breakfast  . heparin  5,000 Units Subcutaneous Q8H  . metoprolol tartrate  25 mg Oral BID  . sodium chloride flush  3 mL Intravenous Q12H  . ticagrelor  90 mg Oral BID  . timolol  1 drop Both Eyes Daily   Continuous Infusions: . sodium chloride    . nitroGLYCERIN Stopped (05/02/17 1253)   PRN Meds: sodium chloride, acetaminophen, alum & mag hydroxide-simeth, loperamide, morphine injection, ondansetron (ZOFRAN) IV, sodium chloride flush, zolpidem   Vital Signs    Vitals:   05/04/17 1956 05/05/17 0016 05/05/17 0503 05/05/17 0758  BP: 122/65 (!) 116/57 (!) 104/52 117/71  Pulse: 85 80 78 87  Resp: 18 18 18 18   Temp: 98.9 F (37.2 C) 99.6 F (37.6 C) 98.9 F (37.2 C) 98.7 F (37.1 C)  TempSrc: Oral Oral Oral Oral  SpO2: 99% 99% 98% 100%  Weight:   173 lb (78.5 kg)   Height:        Intake/Output Summary (Last 24 hours) at 05/05/2017 1016 Last data filed at 05/05/2017  0916 Gross per 24 hour  Intake 720 ml  Output 500 ml  Net 220 ml   Filed Weights   05/02/17 0600 05/04/17 1722 05/05/17 0503  Weight: 186 lb 1.1 oz (84.4 kg) 175 lb 7.8 oz (79.6 kg) 173 lb (78.5 kg)    Telemetry    SR - Personally Reviewed  Physical Exam   General: Well developed, well nourished, AA female appearing in no acute distress. Head: Normocephalic, atraumatic.  Neck: Supple, JVD. Lungs:  Resp regular and unlabored, CTA. Heart: RRR, S1, S2, no S3, S4, or murmur; no rub. Abdomen: Soft, non-tender, non-distended with normoactive bowel sounds. Radial cath site stable.  Extremities: No clubbing, cyanosis, edema. Distal pedal pulses are 2+ bilaterally. Neuro: Alert and oriented X 3. Moves all extremities spontaneously. Psych: Normal affect.  Labs    Chemistry Recent Labs  Lab 05/01/17 0637  05/03/17 0320 05/04/17 0330 05/05/17 0410  NA 138   < > 137 138 138  K 3.9   < > 3.3* 4.0 3.8  CL 105   < > 109 109 109  CO2 23   < > 18* 20* 21*  GLUCOSE 204*   < > 117* 92 104*  BUN 17   < > 10 14 13   CREATININE 1.20*   < > 0.88 0.99 0.98  CALCIUM 9.0   < > 8.7* 8.7* 8.6*  PROT 6.2*  --   --   --   --  ALBUMIN 3.6  --   --   --   --   AST 20  --   --   --   --   ALT 17  --   --   --   --   ALKPHOS 104  --   --   --   --   BILITOT 0.7  --   --   --   --   GFRNONAA 44*   < > >60 56* 57*  GFRAA 51*   < > >60 >60 >60  ANIONGAP 10   < > 10 9 8    < > = values in this interval not displayed.     Hematology Recent Labs  Lab 05/03/17 0320 05/04/17 0330 05/05/17 0410  WBC 6.5 5.5 6.2  RBC 3.56* 3.54* 3.66*  HGB 7.5* 7.8* 7.8*  HCT 25.9* 26.1* 26.8*  MCV 72.8* 73.7* 73.2*  MCH 21.1* 22.0* 21.3*  MCHC 29.0* 29.9* 29.1*  RDW 21.2* 21.8* 21.2*  PLT 198 246 266    Cardiac Enzymes Recent Labs  Lab 05/01/17 0637 05/01/17 1431 05/01/17 2058  TROPONINI <0.03 11.97* 40.81*   No results for input(s): TROPIPOC in the last 168 hours.   BNPNo results for input(s):  BNP, PROBNP in the last 168 hours.   DDimer No results for input(s): DDIMER in the last 168 hours.    Radiology    No results found.  Cardiac Studies   Cath: 05/01/17  Conclusion     Dist RCA lesion is 100% stenosed.  Balloon angioplasty was performed using a BALLOON SAPPHIRE 2.5X15.  Post intervention, there is a 10% residual stenosis.  Ost RPDA lesion is 100% stenosed.  Mid Cx lesion is 20% stenosed.  Prox LAD lesion is 30% stenosed.  The left ventricular systolic function is normal.  LV end diastolic pressure is normal.  The left ventricular ejection fraction is 50-55% by visual estimate.  There is no mitral valve regurgitation.   1. Acute inferior STEMI secondary to occluded mid to distal RCA 2. Successful PTCA/balloon angioplasty/aspiration thrombectomy of the mid/distal RCA 3. Mild non-obstructive disease in the Circumflex and LAD 4. Preserved LV systolic function with mild segmental wall motion abnormality  Recommendations: Will admit to ICU. I have elected not to place a stent in the distal RCA today as the vessel looked much improved after thrombectomy. The PDA remains occluded and could not be opened. Plans for Aggrastat infusion for next 24 hours then re-look cath tomorrow. Will continue ASA/Brlilinta/metoprolol and high dose statin. Will plan echo.    Cath: 05/02/17  Conclusion   Conclusions: 1. Persistent thrombotic occlusion of proximal rPDA. 2. Successful balloon angioplasty and aspiration thrombectomy with reestablishment of flow in the rPDA.  Residual nonocclusive thrombus in the distal vessel remains, which is too distal for intervention.  Recommendations: 7. Continue tirofiban infusion for an additional 18 hours, as hemoglobin tolerates, given baseline anemia. 8. Dual antiplatelet therapy with aspirin and ticagrelor for at least 12 months. 9. Aggressive secondary prevention.  Yvonne Kendall, MD    Patient Profile     72 y.o. female  with history of OA, hiatal hernia, HTN, prior DVT who presented to the ED via EMS this morning with c/o chest pain. EKG with 2-3 mm inferior ST elevation. Code STEMI called by EMS. Pt with ongoing pain in the ED. Her pain woke her this am. The pain radiated to her arms. Associated dyspnea, nausea and diaphoresis.  Assessment & Plan    1. STEMI:  Troponin peaked 40.81. Underwent cardiac cath noted above with successful PTCA/thrombectomy m/dRCA. No stent placement as vessel looked improved after thrombectomy. Was continued on aggrastat post cath. Underwent relook cath the following day with aspiration/thrombectomy to rPDA. Plan for DAPT with ASA/Brilinta. EF noted 50% on LV gram.  -- on ASA, Brilinta, BB, statin -- echo pending  2. HTN: stable on current therapy  3. HL: LDL 132, on high dose statin  4. IDA: Reports hx of the same. Had been on Iron supp prior to admission. This was restarted yesterday. H/H 7.8/26.8 today, stable from yesterday. Denies any symptoms. Has been ambulatory without chest pain or dyspnea.  -- continue daily Iron supp, will plan for close f/u of CBC in the office is discharged today.    Signed, Laverda Page, NP  05/05/2017, 10:16 AM  Pager # [phone]   For questions or updates, please contact [facility] Please consult [email or web address] for contact info under Cardiology/STEMI.

## 2017-05-05 NOTE — Discharge Summary (Signed)
The patient has been seen in conjunction with Geoffry Paradise, [facility]. All aspects of care have been considered and discussed. The patient has been personally interviewed, examined, and all clinical data has been reviewed.   Distal RCA occlusion treated with angioplasty.  Large thrombus burden.  No stent implantation.  No recurrence of ischemic pain.  Hemoglobin slightly less than on admission but relatively stable.  Decided against transfusion.  Patient is ready for discharge today.  Brilinta can be switched to Plavix at completion of the initial month of therapy.  Discharge Summary    Patient ID: NOA GALVAO,  MRN: [id number], DOB/AGE: [birth year] 72 y.o.  Admit date: 05/01/2017 Discharge date: 05/05/2017  Primary Care Provider: Deatra James Primary Cardiologist: Clifton James   Discharge Diagnoses    Active Problems:   Acute myocardial infarction [facility])   S/P PTCA (percutaneous transluminal coronary angioplasty)   Iron deficiency anemia   Hyperlipidemia   Allergies Allergies  Allergen Reactions  . Nitrofurantoin Monohyd Macro Nausea And Vomiting and Other (See Comments)    Stomach pain  . Sulfa Antibiotics Nausea And Vomiting and Other (See Comments)    Lethargic, tired    Diagnostic Studies/Procedures    Cath: 05/01/17  Conclusion     Dist RCA lesion is 100% stenosed.  Balloon angioplasty was performed using a BALLOON SAPPHIRE 2.5X15.  Post intervention, there is a 10% residual stenosis.  Ost RPDA lesion is 100% stenosed.  Mid Cx lesion is 20% stenosed.  Prox LAD lesion is 30% stenosed.  The left ventricular systolic function is normal.  LV end diastolic pressure is normal.  The left ventricular ejection fraction is 50-55% by visual estimate.  There is no mitral valve regurgitation.  1. Acute inferior STEMI secondary to occluded mid to distal RCA 2. Successful PTCA/balloon angioplasty/aspiration thrombectomy of the mid/distal RCA 3. Mild  non-obstructive disease in the Circumflex and LAD 4. Preserved LV systolic function with mild segmental wall motion abnormality  Recommendations: Will admit to ICU. I have elected not to place a stent in the distal RCA today as the vessel looked much improved after thrombectomy. The PDA remains occluded and could not be opened. Plans for Aggrastat infusion for next 24 hours then re-look cath tomorrow. Will continue ASA/Brlilinta/metoprolol and high dose statin. Will plan echo.    Cath: 05/02/17  Conclusion   Conclusions: 1. Persistent thrombotic occlusion of proximal rPDA. 2. Successful balloon angioplasty and aspiration thrombectomy with reestablishment of flow in the rPDA. Residual nonocclusive thrombus in the distal vessel remains, which is too distal for intervention.  Recommendations: 1. Continue tirofiban infusion for an additional 18 hours, as hemoglobin tolerates, given baseline anemia. 2. Dual antiplatelet therapy with aspirin and ticagrelor for at least 12 months. 3. Aggressive secondary prevention.  Yvonne Kendall, MD   _____________   History of Present Illness     72 y.o. female with history of OA, hiatal hernia, HTN, prior DVT who presented to the ED via EMS this morning with c/o chest pain. EKG with 2-3 mm inferior ST elevation. Code STEMI called by EMS. Pt with ongoing pain in the ED. The pain radiated to her arms. Associated dyspnea, nausea and diaphoresis. She was taken to the cath labs for emergent cardiac cath.   Hospital Course     Underwent cardiac cath noted above with successful PTCA/thrombectomy m/dRCA. No stent placement as vessel looked improved after thrombectomy. Was continued on aggrastat post cath. Underwent relook cath the following day with aspiration/thrombectomy to rPDA. Plan  for DAPT with ASA/Brilinta. EF noted 50% on LV gram. Troponin peaked at 40.81. LDL was 132, and placed on high dose statin. Hgb was noted at 8.9 on admission, and dropped to  7.5. No s/s of acute bleeding. Reports hx of IDA and Iron was held on admission. Restarted with some improvement to 7.8. She was able to ambulate without and recurrent chest pain, or dyspnea. Given her hx of anemia, it was felt she would not need a transfusion as she was not symptomatic. She was given Brilinta card at the time of discharge. Seen by CM and should be ok to afford as outpatient but can consider switching to Plavix as outpatient if needed.   Loetta Rough was seen by Dr. Katrinka Blazing and determined stable for discharge home. Follow up in the office has been arranged. Medications are listed below.   _____________  Discharge Vitals Blood pressure 113/62, pulse 71, temperature 98.8 F (37.1 C), temperature source Oral, resp. rate 18, height 5' 10.5" (1.791 m), weight 173 lb (78.5 kg), SpO2 100 %.  Filed Weights   05/02/17 0600 05/04/17 1722 05/05/17 0503  Weight: 186 lb 1.1 oz (84.4 kg) 175 lb 7.8 oz (79.6 kg) 173 lb (78.5 kg)    Labs & Radiologic Studies    CBC Recent Labs    05/04/17 0330 05/05/17 0410  WBC 5.5 6.2  HGB 7.8* 7.8*  HCT 26.1* 26.8*  MCV 73.7* 73.2*  PLT 246 266   Basic Metabolic Panel Recent Labs    30/86/57 0330 05/05/17 0410  NA 138 138  K 4.0 3.8  CL 109 109  CO2 20* 21*  GLUCOSE 92 104*  BUN 14 13  CREATININE 0.99 0.98  CALCIUM 8.7* 8.6*  MG 2.0  --    Liver Function Tests No results for input(s): AST, ALT, ALKPHOS, BILITOT, PROT, ALBUMIN in the last 72 hours. No results for input(s): LIPASE, AMYLASE in the last 72 hours. Cardiac Enzymes No results for input(s): CKTOTAL, CKMB, CKMBINDEX, TROPONINI in the last 72 hours. BNP Invalid input(s): POCBNP D-Dimer No results for input(s): DDIMER in the last 72 hours. Hemoglobin A1C No results for input(s): HGBA1C in the last 72 hours. Fasting Lipid Panel No results for input(s): CHOL, HDL, LDLCALC, TRIG, CHOLHDL, LDLDIRECT in the last 72 hours. Thyroid Function Tests No results for input(s): TSH,  T4TOTAL, T3FREE, THYROIDAB in the last 72 hours.  Invalid input(s): FREET3 _____________  No results found. Disposition   Pt is being discharged home today in good condition.  Follow-up Plans & Appointments    Follow-up Information    Deatra James, MD Follow up.   Specialty:  Family Medicine Contact information: [phone] [address] [place] [address] [phone]        Allayne Butcher, PA-C Follow up on 05/15/2017.   Specialties:  Cardiology, Radiology Why:  at 9am for your follow up appt.  Contact information: [address] [place] [address] [phone]          Discharge Instructions    Amb Referral to Cardiac Rehabilitation   Complete by:  As directed    Diagnosis:   STEMI PTCA     Call MD for:  redness, tenderness, or signs of infection (pain, swelling, redness, odor or green/yellow discharge around incision site)   Complete by:  As directed    Diet - low sodium heart healthy   Complete by:  As directed    Discharge instructions   Complete by:  As directed    Radial Site Care Refer to this sheet in the next few weeks. These instructions provide you with information on caring for yourself after your procedure. Your caregiver may also give you more specific instructions. Your treatment has been planned according to current medical practices, but problems sometimes occur. Call your caregiver if you have any problems or questions after your procedure. HOME CARE INSTRUCTIONS You may shower the day after the procedure.Remove the bandage (dressing) and gently wash the site with plain soap and water.Gently pat the site dry.  Do not apply powder or lotion to the site.  Do not submerge the affected site in water for 3 to 5 days.  Inspect the site at least twice daily.  Do not flex or bend the affected arm for 24 hours.  No lifting over 5 pounds (2.3 kg) for 5 days after your procedure.  Do not drive home if you are discharged the  same day of the procedure. Have someone else drive you.  You may drive 24 hours after the procedure unless otherwise instructed by your caregiver.  What to expect: Any bruising will usually fade within 1 to 2 weeks.  Blood that collects in the tissue (hematoma) may be painful to the touch. It should usually decrease in size and tenderness within 1 to 2 weeks.  SEEK IMMEDIATE MEDICAL CARE IF: You have unusual pain at the radial site.  You have redness, warmth, swelling, or pain at the radial site.  You have drainage (other than a small amount of blood on the dressing).  You have chills.  You have a fever or persistent symptoms for more than 72 hours.  You have a fever and your symptoms suddenly get worse.  Your arm becomes pale, cool, tingly, or numb.  You have heavy bleeding from the site. Hold pressure on the site.   PLEASE DO NOT MISS ANY DOSES OF YOUR BRILINTA!!!!! Also keep a log of you blood pressures and bring back to your follow up appt. Please call the office with any questions.   Patients taking blood thinners should generally stay away from medicines like ibuprofen, Advil, Motrin, naproxen, and Aleve due to risk of stomach bleeding. You may take Tylenol as directed or talk to your primary doctor about alternatives.   Increase activity slowly   Complete by:  As directed       Discharge Medications     Medication List    STOP taking these medications   HYDROcodone-acetaminophen 5-325 MG tablet Commonly known as:  NORCO/VICODIN   ibuprofen 200 MG tablet Commonly known as:  ADVIL,MOTRIN   losartan 100 MG tablet Commonly known as:  COZAAR     TAKE these medications   aspirin 81 MG chewable tablet Chew 1 tablet (81 mg total) by mouth daily. Start taking on:  05/06/2017   atorvastatin 80 MG tablet Commonly known as:  LIPITOR Take 1 tablet (80 mg total) by mouth daily at 6 PM.   ferrous gluconate 324 MG tablet Commonly known as:  FERGON Take 1 tablet (324 mg total)  by mouth daily with breakfast. Start taking on:  05/06/2017   metoprolol tartrate 25 MG tablet Commonly known as:  LOPRESSOR Take 1 tablet (25 mg total) by mouth 2 (two) times daily.   nitroGLYCERIN 0.4 MG SL tablet Commonly known as:  NITROSTAT Place 1 tablet (0.4 mg total) under the tongue every 5 (five) minutes as needed.   ticagrelor 90 MG Tabs tablet Commonly known as:  BRILINTA Take 1 tablet (90 mg total) by mouth 2 (two) times daily.   timolol 0.5 % ophthalmic solution Commonly known as:  TIMOPTIC Use 1 drop in both eyes every day        Aspirin prescribed at discharge?  Yes High Intensity Statin Prescribed? (Lipitor 40-80mg  or Crestor 20-40mg ): Yes Beta Blocker Prescribed? Yes For EF <40%, was ACEI/ARB Prescribed? No: Blood pressure were soft, consider adding back as outpatient ADP Receptor Inhibitor Prescribed? (i.e. Plavix etc.-Includes Medically Managed Patients): Yes For EF <40%, Aldosterone Inhibitor Prescribed? No: EF ok Was EF assessed during THIS hospitalization? Yes Was Cardiac Rehab II ordered? (Included Medically managed Patients): Yes   Outstanding Labs/Studies   FLP/LFTs in 6 weeks if tolerating statin. CBC at follow up appt.   Duration of Discharge Encounter   Greater than 30 minutes including physician time.  Signed, Laverda Page NP-C 05/05/2017, 2:04 PM

## 2017-05-05 NOTE — Progress Notes (Signed)
Spoke to NP Lillia Abed who stated pt may be discharged. NP aware 2D echo has not been read yet, NP stated it was okay.

## 2017-05-05 NOTE — Progress Notes (Signed)
  Echocardiogram 2D Echocardiogram has been performed.  Jenna Thompson 05/05/2017, 2:12 PM

## 2017-05-06 ENCOUNTER — Telehealth (HOSPITAL_COMMUNITY): Payer: Self-pay

## 2017-05-06 NOTE — Telephone Encounter (Signed)
Patients insurance is active and benefits verified through [facility] - $20.00 co-pay, no deductible, out of pocket amount of $4,400/$0.00 has been met, no co-insurance, and no pre-authorization is required. Passport/reference [phone]  Patient will be contacted and scheduled after their follow up appt with the Cardiologist office upon review by the RN Navigator.

## 2017-05-06 NOTE — Telephone Encounter (Signed)
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 I stated that I will give her a call after her follow up appt to see how she is feeling and if we can work around her work schedule.

## 2017-05-15 ENCOUNTER — Encounter: Payer: Self-pay | Admitting: Cardiology

## 2017-05-15 ENCOUNTER — Ambulatory Visit: Payer: Medicare Other | Admitting: Cardiology

## 2017-05-15 ENCOUNTER — Encounter (INDEPENDENT_AMBULATORY_CARE_PROVIDER_SITE_OTHER): Payer: Self-pay

## 2017-05-15 VITALS — BP 122/72 | HR 82 | Ht 70.5 in | Wt 172.0 lb

## 2017-05-15 DIAGNOSIS — E785 Hyperlipidemia, unspecified: Secondary | ICD-10-CM

## 2017-05-15 DIAGNOSIS — I2510 Atherosclerotic heart disease of native coronary artery without angina pectoris: Secondary | ICD-10-CM | POA: Diagnosis not present

## 2017-05-15 DIAGNOSIS — Z9861 Coronary angioplasty status: Secondary | ICD-10-CM

## 2017-05-15 NOTE — Progress Notes (Signed)
05/15/2017 Jenna Thompson   Jul 26, 1945  [id number]  Primary Physician Deatra James, MD Primary Cardiologist: Dr. Clifton James  Reason for Visit/CC: [facility] f/u for CAD s/p STEMI  HPI:  Jenna Thompson is a 72 y.o. female who is being seen today for hospital follow-up after recent admission for acute ST elevation myocardial infarction.  Her past medical history is notable for osteoarthritis, hiatal hernia, hypertension, prior DVT and iron deficiency anemia. She presented to [facility] via EMS on 1/17 2019 with chest pain and EKG showing 2-3 mm inferior ST elevation.  Code STEMI was activated and she was taken to the cardiac catheterization lab by Dr. Clifton James.  Cath showed 100% distal RCA stenosis.  She underwent successful PTCA/thrombectomy of th m/dRCA. No stent placement as vessel looked improved after thrombectomy. She was continued on aggrastat post cath and underwent relook cath the following day with aspiration/thrombectomy to rPDA. Plan for DAPT with ASA/Brilinta. EF noted 50% on LV gram. Troponin peaked at 40.81. LDL was 132, and she was placed on high dose statin. Hgb was noted at 8.9 on admission, and dropped to 7.5. No s/s of acute bleeding. Reports hx of IDA and Iron was held on admission. Restarted with some improvement to 7.8. She was able to ambulate without any recurrent chest pain, or dyspnea. Given her hx of anemia, it was felt she would not need a transfusion as she was not symptomatic. She was given Brilinta card at the time of discharge. Seen by case managment and should be ok to afford as outpatient but can consider switching to Plavix as outpatient if needed.   Today in clinic she reports that she has done well since discharge.  She denies any recurrent chest pain.  No use of sublingual nitroglycerin.  She also denies dyspnea.  Her bedroom is on the second floor of her home and she has to ambulate 14 steps.  She denies any exertional symptoms.  Reports full medication  compliance.  She is tolerating aspirin and Brilinta well without any abnormal bleeding.  She also reports full compliance with Lipitor and metoprolol.  Blood pressure is well controlled at 122/72.  Pulse rate 82 bpm.  Since discharge from the hospital she has followed up with her PCP and has had repeat labs to reassess her anemia.  She reports that her hemoglobin was stable and her PCP started her back on supplemental iron.  She is scheduled to go back in 6 weeks for repeat laboratory work.  She reports that she had no difficulties with the cost of Brilinta when she went to the pharmacy recently.  All of her medicines totaled $19.  She will plan to continue Brilinta at this time and will notify our office if she has any issues going forward with cost.   Cardiac Studies   Coronary/Graft Acute MI Revascularization  LEFT HEART CATH AND CORONARY ANGIOGRAPHY  Conclusion     Dist RCA lesion is 100% stenosed.  Balloon angioplasty was performed using a BALLOON SAPPHIRE 2.5X15.  Post intervention, there is a 10% residual stenosis.  Ost RPDA lesion is 100% stenosed.  Mid Cx lesion is 20% stenosed.  Prox LAD lesion is 30% stenosed.  The left ventricular systolic function is normal.  LV end diastolic pressure is normal.  The left ventricular ejection fraction is 50-55% by visual estimate.  There is no mitral valve regurgitation.   1. Acute inferior STEMI secondary to occluded mid to distal RCA 2. Successful PTCA/balloon angioplasty/aspiration thrombectomy  of the mid/distal RCA 3. Mild non-obstructive disease in the Circumflex and LAD 4. Preserved LV systolic function with mild segmental wall motion abnormality  Recommendations: Will admit to ICU. I have elected not to place a stent in the distal RCA today as the vessel looked much improved after thrombectomy. The PDA remains occluded and could not be opened. Plans for Aggrastat infusion for next 24 hours then re-look cath tomorrow. Will  continue ASA/Brlilinta/metoprolol and high dose statin. Will plan echo.    Conclusion   Conclusions: 1. Persistent thrombotic occlusion of proximal rPDA. 2. Successful balloon angioplasty and aspiration thrombectomy with reestablishment of flow in the rPDA.  Residual nonocclusive thrombus in the distal vessel remains, which is too distal for intervention.  Recommendations: 1. Continue tirofiban infusion for an additional 18 hours, as hemoglobin tolerates, given baseline anemia. 2. Dual antiplatelet therapy with aspirin and ticagrelor for at least 12 months. 3. Aggressive secondary prevention.     2D Echo 05/05/17 Study Conclusions  - Left ventricle: Inferobasal hypokinesis The cavity size was   normal. Wall thickness was increased in a pattern of moderate   LVH. Systolic function was normal. The estimated ejection   fraction was 55%. Wall motion was normal; there were no regional   wall motion abnormalities. Doppler parameters are consistent with   abnormal left ventricular relaxation (grade 1 diastolic   dysfunction). - Mitral valve: There was mild regurgitation. - Left atrium: The atrium was mildly dilated. - Atrial septum: No defect or patent foramen ovale was identified. - Pulmonary arteries: PA peak pressure: 33 mm Hg (S). - Impressions: Normal GLS -17.  Impressions:  - Normal GLS -17.   Current Meds  Medication Sig  . aspirin 81 MG chewable tablet Chew 1 tablet (81 mg total) by mouth daily.  Marland Kitchen atorvastatin (LIPITOR) 80 MG tablet Take 1 tablet (80 mg total) by mouth daily at 6 PM.  . ferrous gluconate (FERGON) 324 MG tablet Take 1 tablet (324 mg total) by mouth daily with breakfast.  . metoprolol tartrate (LOPRESSOR) 25 MG tablet Take 1 tablet (25 mg total) by mouth 2 (two) times daily.  . nitroGLYCERIN (NITROSTAT) 0.4 MG SL tablet Place 1 tablet (0.4 mg total) under the tongue every 5 (five) minutes as needed.  . ticagrelor (BRILINTA) 90 MG TABS tablet Take 1  tablet (90 mg total) by mouth 2 (two) times daily.  . timolol (TIMOPTIC) 0.5 % ophthalmic solution Use 1 drop in both eyes every day   Allergies  Allergen Reactions  . Nitrofurantoin Monohyd Macro Nausea And Vomiting and Other (See Comments)    Stomach pain  . Sulfa Antibiotics Nausea And Vomiting and Other (See Comments)    Lethargic, tired   Past Medical History:  Diagnosis Date  . Anemia, iron deficiency   . Arthritis    L>R knee  . Atrophic gastritis without mention of hemorrhage   . Colon polyps   . Diaphragmatic hernia without mention of obstruction or gangrene   . Diverticulosis of colon (without mention of hemorrhage)   . Hiatal hernia   . Hypertension   . STEMI (ST elevation myocardial infarction) (HCC)    1/19 STEMI with PTCA/Thrombectomy to dRCA, rPDA, normal EF.   Marland Kitchen Unspecified hemorrhoids without mention of complication    Family History  Problem Relation Age of Onset  . Kidney disease Mother   . Colon cancer Neg Hx    Past Surgical History:  Procedure Laterality Date  . ABDOMINAL HYSTERECTOMY  1978  .  CHOLECYSTECTOMY  1979  . CORONARY ANGIOGRAPHY N/A 05/02/2017   Procedure: CORONARY ANGIOGRAPHY (CATH LAB);  Surgeon: Yvonne Kendall, MD;  Location: [facility] INVASIVE CV LAB;  Service: Cardiovascular;  Laterality: N/A;  . CORONARY BALLOON ANGIOPLASTY N/A 05/02/2017   Procedure: CORONARY BALLOON ANGIOPLASTY;  Surgeon: Yvonne Kendall, MD;  Location: [facility] INVASIVE CV LAB;  Service: Cardiovascular;  Laterality: N/A;  . CORONARY THROMBECTOMY N/A 05/02/2017   Procedure: Coronary Thrombectomy;  Surgeon: Yvonne Kendall, MD;  Location: [facility] INVASIVE CV LAB;  Service: Cardiovascular;  Laterality: N/A;  . CORONARY/GRAFT ACUTE MI REVASCULARIZATION N/A 05/01/2017   Procedure: Coronary/Graft Acute MI Revascularization;  Surgeon: Kathleene Hazel, MD;  Location: [facility] INVASIVE CV LAB;  Service: Cardiovascular;  Laterality: N/A;  . KNEE SURGERY  2001   left   . LEFT HEART CATH AND  CORONARY ANGIOGRAPHY N/A 05/01/2017   Procedure: LEFT HEART CATH AND CORONARY ANGIOGRAPHY;  Surgeon: Kathleene Hazel, MD;  Location: [facility] INVASIVE CV LAB;  Service: Cardiovascular;  Laterality: N/A;  . TONSILLECTOMY AND ADENOIDECTOMY  1970   Social History   Socioeconomic History  . Marital status: Legally Separated    Spouse name: Not on file  . Number of children: 4  . Years of education: Not on file  . Highest education level: Not on file  Social Needs  . Financial resource strain: Not on file  . Food insecurity - worry: Not on file  . Food insecurity - inability: Not on file  . Transportation needs - medical: Not on file  . Transportation needs - non-medical: Not on file  Occupational History  . Occupation: Retired  Tobacco Use  . Smoking status: Former Games developer  . Smokeless tobacco: Never Used  Substance and Sexual Activity  . Alcohol use: No  . Drug use: No  . Sexual activity: Not on file  Other Topics Concern  . Not on file  Social History Narrative   2 caffeine drinks daily     Review of Systems: General: negative for chills, fever, night sweats or weight changes.  Cardiovascular: negative for chest pain, dyspnea on exertion, edema, orthopnea, palpitations, paroxysmal nocturnal dyspnea or shortness of breath Dermatological: negative for rash Respiratory: negative for cough or wheezing Urologic: negative for hematuria Abdominal: negative for nausea, vomiting, diarrhea, bright red blood per rectum, melena, or hematemesis Neurologic: negative for visual changes, syncope, or dizziness All other systems reviewed and are otherwise negative except as noted above.   Physical Exam:  Blood pressure 122/72, pulse 82, height 5' 10.5" (1.791 m), weight 172 lb (78 kg), SpO2 99 %.  General appearance: alert, cooperative, no distress and moderately obese Neck: no carotid bruit and no JVD Lungs: clear to auscultation bilaterally Heart: regular rate and rhythm and 3/6 murmur  auscultated at the right and left upper sternal border Extremities: extremities normal, atraumatic, no cyanosis or edema Pulses: 2+ and symmetric Skin: Skin color, texture, turgor normal. No rashes or lesions Neurologic: Grossly normal  EKG sinus rhythm with first-degree AV block otherwise normal EKG-- personally reviewed   ASSESSMENT AND PLAN:   1.  CAD status post STEMI: Acute inferior STEMI secondary to occluded mid to distal RCA. Successful PTCA/balloon angioplasty/aspiration thrombectomy of the mid/distal RCA.  Mild non-obstructive disease in the Circumflex and LAD.  Preserved LV systolic function with mild segmental wall motion abnormality.  She is stable without any recurrent anginal symptomatology.  Continue aspirin, Brilinta statin and beta-blocker.  2.  Hyperlipidemia: Recent LDL was 132 mg/dL.  LDL goal in the setting  of known coronary artery disease and recent MI is less than 70 mg/dL.  She was recently started on high-dose statin therapy with Lipitor 80 mg nightly for which she reports full compliance.  Recheck fasting lipid panel and hepatic function tests in 6 weeks.  3.  Iron deficiency anemia: Followed by PCP and recently restarted on supplemental iron. Due for repeat CBC in several more weeks. PCP to follow. Per Pt report, PCP is ok with her staying on DAPT for now. She denies melena.   4. HTN: BP is controlled on current regimen.    Follow-Up w/ Dr. Clifton James in 8 weeks.   Etna Forquer Delmer Islam, MHS [facility] 05/15/2017 9:04 AM

## 2017-05-15 NOTE — Patient Instructions (Signed)
Medication Instructions:    Your physician recommends that you continue on your current medications as directed. Please refer to the Current Medication list given to you today.   If you need a refill on your cardiac medications before your next appointment, please call your pharmacy.  Labwork:  IN 6 WEEKS  RETURN FOR FASTING LIPIDS AND HEPATIC     Testing/Procedures: NONE ORDERED  TODAY    Follow-Up:  IN 2 TO 3 MONTHS WITH [facility]    Any Other Special Instructions Will Be Listed Below (If Applicable).

## 2017-05-19 ENCOUNTER — Telehealth (HOSPITAL_COMMUNITY): Payer: Self-pay

## 2017-05-19 NOTE — Telephone Encounter (Signed)
Called and spoke with patient in regards to Cardiac Rehab - patient would like to speak with her daughter before scheduling. Will follow up in a week if no phone call.

## 2017-05-26 ENCOUNTER — Telehealth (HOSPITAL_COMMUNITY): Payer: Self-pay

## 2017-05-26 NOTE — Telephone Encounter (Signed)
Attempted to call patient in regards to Cardiac Rehab - lm on vm °

## 2017-05-26 NOTE — Telephone Encounter (Signed)
Patient returned phone call and stated she is interested in the program but needs to get finances situated first. Will follow up in a couple of weeks.

## 2017-06-18 ENCOUNTER — Telehealth (HOSPITAL_COMMUNITY): Payer: Self-pay

## 2017-06-18 NOTE — Telephone Encounter (Signed)
Attempted to call patient and follow up in regards to Cardiac Rehab - lm on vm

## 2017-06-25 ENCOUNTER — Telehealth (HOSPITAL_COMMUNITY): Payer: Self-pay

## 2017-06-25 ENCOUNTER — Encounter (HOSPITAL_COMMUNITY): Payer: Self-pay

## 2017-06-25 NOTE — Telephone Encounter (Signed)
2nd attempt to call patient in regards to Cardiac Rehab - lm on vm. Sending letter. °

## 2017-06-30 ENCOUNTER — Other Ambulatory Visit: Payer: Medicare Other | Admitting: *Deleted

## 2017-06-30 DIAGNOSIS — E785 Hyperlipidemia, unspecified: Secondary | ICD-10-CM

## 2017-06-30 LAB — HEPATIC FUNCTION PANEL
ALK PHOS: 106 IU/L (ref 39–117)
ALT: 17 IU/L (ref 0–32)
AST: 18 IU/L (ref 0–40)
Albumin: 4.4 g/dL (ref 3.5–4.8)
Bilirubin Total: 0.5 mg/dL (ref 0.0–1.2)
Bilirubin, Direct: 0.16 mg/dL (ref 0.00–0.40)
Total Protein: 6.3 g/dL (ref 6.0–8.5)

## 2017-06-30 LAB — LIPID PANEL
CHOLESTEROL TOTAL: 107 mg/dL (ref 100–199)
Chol/HDL Ratio: 2.7 ratio (ref 0.0–4.4)
HDL: 40 mg/dL (ref >39)
LDL CALC: 54 mg/dL (ref 0–99)
TRIGLYCERIDES: 66 mg/dL (ref 0–149)
VLDL CHOLESTEROL CAL: 13 mg/dL (ref 5–40)

## 2017-07-01 NOTE — Progress Notes (Signed)
Pt has been made aware of normal result and verbalized understanding.  jw 07/01/17

## 2017-07-04 ENCOUNTER — Telehealth (HOSPITAL_COMMUNITY): Payer: Self-pay

## 2017-07-04 NOTE — Telephone Encounter (Signed)
3rd attempt to call patient in regards to Cardiac Rehab - lm on vm °

## 2017-07-11 ENCOUNTER — Telehealth (HOSPITAL_COMMUNITY): Payer: Self-pay

## 2017-07-11 NOTE — Telephone Encounter (Signed)
No response from patient - Closed referral. °

## 2017-07-26 ENCOUNTER — Other Ambulatory Visit: Payer: Self-pay | Admitting: Cardiology

## 2017-07-28 NOTE — Telephone Encounter (Signed)
Please ask primary care to refill this

## 2017-07-28 NOTE — Telephone Encounter (Signed)
Pt's pharmacy is requesting a refill on ferrous gluconate. Pt was prescribed this medication on the CHF unit in the hospital. Would Dr. Clifton James like to refill this medication? Please advise

## 2017-08-15 ENCOUNTER — Encounter (INDEPENDENT_AMBULATORY_CARE_PROVIDER_SITE_OTHER): Payer: Self-pay

## 2017-08-15 ENCOUNTER — Encounter: Payer: Self-pay | Admitting: Cardiovascular Disease

## 2017-08-15 ENCOUNTER — Ambulatory Visit: Payer: Medicare Other | Admitting: Cardiovascular Disease

## 2017-08-15 VITALS — BP 132/74 | HR 65 | Ht 70.5 in | Wt 173.6 lb

## 2017-08-15 DIAGNOSIS — I10 Essential (primary) hypertension: Secondary | ICD-10-CM | POA: Diagnosis not present

## 2017-08-15 DIAGNOSIS — I2510 Atherosclerotic heart disease of native coronary artery without angina pectoris: Secondary | ICD-10-CM

## 2017-08-15 DIAGNOSIS — E785 Hyperlipidemia, unspecified: Secondary | ICD-10-CM | POA: Diagnosis not present

## 2017-08-15 NOTE — Progress Notes (Signed)
Chief Complaint  Patient presents with  . Follow-up    cad   History of Present Illness: 72 yo female with history of CAD, HTN, prior DVT, anemia who is here today for cardiac follow up. She was admitted to [facility] on 05/01/17 with an acute inferior STEMI. Emergent cath with thrombotic occlusion of the mid to distal RCA treated with balloon angioplasty and thrombectomy. No stent was placed. She was treated with Aggrastat for 18 hours and then had repeat cath on 05/02/17 at which time the PDA was treated with aspiration thrombectomy. Echo 05/05/17 with LVEF=55%, inferior wall hypokinesis. She was discharged on ASA and Brilinta.   She is here today for follow up. The patient denies any chest pain, dyspnea, palpitations, lower extremity edema, orthopnea, PND, dizziness, near syncope or syncope.   Primary Care Physician: Deatra James, MD  Past Medical History:  Diagnosis Date  . Anemia, iron deficiency   . Arthritis    L>R knee  . Atrophic gastritis without mention of hemorrhage   . Colon polyps   . Diaphragmatic hernia without mention of obstruction or gangrene   . Diverticulosis of colon (without mention of hemorrhage)   . Hiatal hernia   . Hypertension   . STEMI (ST elevation myocardial infarction) (HCC)    1/19 STEMI with PTCA/Thrombectomy to dRCA, rPDA, normal EF.   Marland Kitchen Unspecified hemorrhoids without mention of complication     Past Surgical History:  Procedure Laterality Date  . ABDOMINAL HYSTERECTOMY  1978  . CHOLECYSTECTOMY  1979  . CORONARY ANGIOGRAPHY N/A 05/02/2017   Procedure: CORONARY ANGIOGRAPHY (CATH LAB);  Surgeon: Yvonne Kendall, MD;  Location: [facility] INVASIVE CV LAB;  Service: Cardiovascular;  Laterality: N/A;  . CORONARY BALLOON ANGIOPLASTY N/A 05/02/2017   Procedure: CORONARY BALLOON ANGIOPLASTY;  Surgeon: Yvonne Kendall, MD;  Location: [facility] INVASIVE CV LAB;  Service: Cardiovascular;  Laterality: N/A;  . CORONARY THROMBECTOMY N/A 05/02/2017   Procedure: Coronary  Thrombectomy;  Surgeon: Yvonne Kendall, MD;  Location: [facility] INVASIVE CV LAB;  Service: Cardiovascular;  Laterality: N/A;  . CORONARY/GRAFT ACUTE MI REVASCULARIZATION N/A 05/01/2017   Procedure: Coronary/Graft Acute MI Revascularization;  Surgeon: Kathleene Hazel, MD;  Location: [facility] INVASIVE CV LAB;  Service: Cardiovascular;  Laterality: N/A;  . KNEE SURGERY  2001   left   . LEFT HEART CATH AND CORONARY ANGIOGRAPHY N/A 05/01/2017   Procedure: LEFT HEART CATH AND CORONARY ANGIOGRAPHY;  Surgeon: Kathleene Hazel, MD;  Location: [facility] INVASIVE CV LAB;  Service: Cardiovascular;  Laterality: N/A;  . TONSILLECTOMY AND ADENOIDECTOMY  1970    Current Outpatient Medications  Medication Sig Dispense Refill  . aspirin 81 MG chewable tablet Chew 1 tablet (81 mg total) by mouth daily.    Marland Kitchen atorvastatin (LIPITOR) 80 MG tablet TAKE 1 TABLET (80 MG TOTAL) BY MOUTH DAILY AT 6 PM. 90 tablet 3  . ferrous gluconate (FERGON) 324 MG tablet Take 1 tablet (324 mg total) by mouth daily with breakfast. 30 tablet 3  . metoprolol tartrate (LOPRESSOR) 25 MG tablet TAKE 1 TABLET BY MOUTH TWICE A DAY 180 tablet 3  . nitroGLYCERIN (NITROSTAT) 0.4 MG SL tablet Place 1 tablet (0.4 mg total) under the tongue every 5 (five) minutes as needed. 25 tablet 2  . ticagrelor (BRILINTA) 90 MG TABS tablet Take 1 tablet (90 mg total) by mouth 2 (two) times daily. 180 tablet 1  . timolol (TIMOPTIC) 0.5 % ophthalmic solution Use 1 drop in both eyes every day     No  current facility-administered medications for this visit.     Allergies  Allergen Reactions  . Nitrofurantoin Monohyd Macro Nausea And Vomiting and Other (See Comments)    Stomach pain  . Sulfa Antibiotics Nausea And Vomiting and Other (See Comments)    Lethargic, tired    Social History   Socioeconomic History  . Marital status: Legally Separated    Spouse name: Not on file  . Number of children: 4  . Years of education: Not on file  . Highest education  level: Not on file  Occupational History  . Occupation: Retired  Engineer, production  . Financial resource strain: Not on file  . Food insecurity:    Worry: Not on file    Inability: Not on file  . Transportation needs:    Medical: Not on file    Non-medical: Not on file  Tobacco Use  . Smoking status: Former Games developer  . Smokeless tobacco: Never Used  Substance and Sexual Activity  . Alcohol use: No  . Drug use: No  . Sexual activity: Not on file  Lifestyle  . Physical activity:    Days per week: Not on file    Minutes per session: Not on file  . Stress: Not on file  Relationships  . Social connections:    Talks on phone: Not on file    Gets together: Not on file    Attends religious service: Not on file    Active member of club or organization: Not on file    Attends meetings of clubs or organizations: Not on file    Relationship status: Not on file  . Intimate partner violence:    Fear of current or ex partner: Not on file    Emotionally abused: Not on file    Physically abused: Not on file    Forced sexual activity: Not on file  Other Topics Concern  . Not on file  Social History Narrative   2 caffeine drinks daily    Family History  Problem Relation Age of Onset  . Kidney disease Mother   . Colon cancer Neg Hx     Review of Systems:  As stated in the HPI and otherwise negative.   BP 132/74   Pulse 65   Ht 5' 10.5" (1.791 m)   Wt 173 lb 9.6 oz (78.7 kg)   SpO2 98%   BMI 24.56 kg/m   Physical Examination: General: Well developed, well nourished, NAD  HEENT: OP clear, mucus membranes moist  SKIN: warm, dry. No rashes. Neuro: No focal deficits  Musculoskeletal: Muscle strength 5/5 all ext  Psychiatric: Mood and affect normal  Neck: No JVD, no carotid bruits, no thyromegaly, no lymphadenopathy.  Lungs:Clear bilaterally, no wheezes, rhonci, crackles Cardiovascular: Regular rate and rhythm. No murmurs, gallops or rubs. Abdomen:Soft. Bowel sounds present.  Non-tender.  Extremities: No lower extremity edema. Pulses are 2 + in the bilateral DP/PT.  Echo January 2019: Left ventricle: Inferobasal hypokinesis The cavity size was normal. Wall thickness was increased in a pattern of moderate LVH. Systolic function was normal. The estimated ejection fraction was 55%. Wall motion was normal; there were no regional wall motion abnormalities. Doppler parameters are consistent with abnormal left ventricular relaxation (grade 1 diastolic dysfunction). - Mitral valve: There was mild regurgitation. - Left atrium: The atrium was mildly dilated. - Atrial septum: No defect or patent foramen ovale was identified. - Pulmonary arteries: PA peak pressure: 33 mm Hg (S).  EKG:  EKG is not ordered today.  The ekg ordered today demonstrates   Recent Labs: 05/04/2017: Magnesium 2.0 05/05/2017: BUN 13; Creatinine, Ser 0.98; Hemoglobin 7.8; Platelets 266; Potassium 3.8; Sodium 138 06/30/2017: ALT 17   Lipid Panel    Component Value Date/Time   CHOL 107 06/30/2017 0840   TRIG 66 06/30/2017 0840   HDL 40 06/30/2017 0840   CHOLHDL 2.7 06/30/2017 0840   CHOLHDL 4.6 05/01/2017 0637   VLDL 20 05/01/2017 0637   LDLCALC 54 06/30/2017 0840     Wt Readings from Last 3 Encounters:  08/15/17 173 lb 9.6 oz (78.7 kg)  05/15/17 172 lb (78 kg)  05/05/17 173 lb (78.5 kg)     Other studies Reviewed: Additional studies/ records that were reviewed today include: . Review of the above records demonstrates:   Assessment and Plan:   1. CAD without angina: Recent inferior STEMI in January 2019 secondary to thrombotic occlusion of hte mid RCA. This was treated with balloon angioplasty and thrombectomy. No stent was placed. Mild disease noted in the LAD and Circumflex. She has done well on ASA and Brilinta. No bleeding. She has no chest pain. Will continue ASA, Brilinta, statin and beta blocker.   2. HLD: LDL at goal. Continue statin.   3. HTN: BP is controlled.  NO changes.   Current medicines are reviewed at length with the patient today.  The patient does not have concerns regarding medicines.  The following changes have been made:  no change  Labs/ tests ordered today include:  No orders of the defined types were placed in this encounter.    Disposition:   FU with me in 6 months   Signed, Verne Carrow, MD 08/15/2017 10:23 AM    [facility] [address] [place], [place], [place]  [address] Phone: [phone]; Fax: [phone]

## 2017-08-15 NOTE — Patient Instructions (Signed)
Medication Instructions:  Your provider recommends that you continue on your current medications as directed. Please refer to the Current Medication list given to you today.    Labwork: None  Testing/Procedures: None  Follow-Up: Your provider wants you to follow-up in: 6 months with Dr. McAlhany. You will receive a reminder letter in the mail two months in advance. If you don't receive a letter, please call our office to schedule the follow-up appointment.    Any Other Special Instructions Will Be Listed Below (If Applicable).     If you need a refill on your cardiac medications before your next appointment, please call your pharmacy.   

## 2017-08-27 ENCOUNTER — Telehealth: Payer: Self-pay | Admitting: Hematology

## 2017-08-27 ENCOUNTER — Encounter: Payer: Self-pay | Admitting: Hematology

## 2017-08-27 NOTE — Telephone Encounter (Signed)
Referral from [facility] for the pt to see a hematologist. Pt has been scheduled to see Dr. Candise Che on 6/26 at 10am. Pt aware to arrive 30 minutes early. Pt preferred appointment at the end of June. Letter mailed.

## 2017-09-05 ENCOUNTER — Other Ambulatory Visit: Payer: Self-pay | Admitting: Cardiology

## 2017-09-05 NOTE — Telephone Encounter (Signed)
Pt's pharmacy is requesting a refill on ferrous gluconate. Would Dr. Clifton James like to refill this medication? Please advise

## 2017-09-05 NOTE — Telephone Encounter (Signed)
Please send to pt's primary care doctor. Anemia has been followed by pt's PCP

## 2017-09-30 ENCOUNTER — Other Ambulatory Visit: Payer: Self-pay | Admitting: Cardiology

## 2017-09-30 NOTE — Telephone Encounter (Signed)
Please send to primary care

## 2017-09-30 NOTE — Telephone Encounter (Signed)
Pt pharmacy is requesting a refill on ferrous gluconate 324 MG tablet. Would Dr. Clifton James like to refill this medication? Please address.

## 2017-09-30 NOTE — Addendum Note (Signed)
Addended by: Adele Schilder on: 09/30/2017 12:22 PM   Modules accepted: Orders

## 2017-10-06 ENCOUNTER — Telehealth: Payer: Self-pay | Admitting: Hematology

## 2017-10-06 NOTE — Telephone Encounter (Signed)
Tried to call regarding voicemail  °

## 2017-10-07 ENCOUNTER — Telehealth: Payer: Self-pay | Admitting: Hematology

## 2017-10-07 ENCOUNTER — Encounter: Payer: Self-pay | Admitting: Hematology

## 2017-10-07 NOTE — Telephone Encounter (Signed)
Pt called to reschedule appt to 8/21 at 10am. New letter mailed to the pt.

## 2017-10-08 ENCOUNTER — Inpatient Hospital Stay: Payer: Medicare Other | Admitting: Hematology

## 2017-11-20 ENCOUNTER — Other Ambulatory Visit: Payer: Self-pay

## 2017-11-20 MED ORDER — TICAGRELOR 90 MG PO TABS: 90.0000 mg | ORAL_TABLET | Freq: Two times a day (BID) | ORAL | 0 refills | Status: DC

## 2017-12-03 ENCOUNTER — Inpatient Hospital Stay: Payer: Medicare Other | Admitting: Hematology

## 2017-12-04 ENCOUNTER — Telehealth: Payer: Self-pay | Admitting: Hematology

## 2017-12-04 NOTE — Telephone Encounter (Signed)
Lft the pt a vm to reschedule appt with Dr. Candise Che

## 2017-12-31 ENCOUNTER — Encounter (HOSPITAL_COMMUNITY): Payer: Self-pay

## 2017-12-31 ENCOUNTER — Emergency Department (HOSPITAL_COMMUNITY)
Admission: EM | Admit: 2017-12-31 | Discharge: 2018-01-01 | Disposition: A | Discharge status: Home / Self Care | Payer: Medicare Other | Attending: Emergency Medicine | Admitting: Emergency Medicine

## 2017-12-31 DIAGNOSIS — N3001 Acute cystitis with hematuria: Secondary | ICD-10-CM | POA: Insufficient documentation

## 2017-12-31 DIAGNOSIS — I10 Essential (primary) hypertension: Secondary | ICD-10-CM | POA: Insufficient documentation

## 2017-12-31 DIAGNOSIS — K5792 Diverticulitis of intestine, part unspecified, without perforation or abscess without bleeding: Secondary | ICD-10-CM

## 2017-12-31 DIAGNOSIS — Z7982 Long term (current) use of aspirin: Secondary | ICD-10-CM | POA: Insufficient documentation

## 2017-12-31 DIAGNOSIS — Z79899 Other long term (current) drug therapy: Secondary | ICD-10-CM | POA: Insufficient documentation

## 2017-12-31 DIAGNOSIS — R1032 Left lower quadrant pain: Secondary | ICD-10-CM | POA: Diagnosis present

## 2017-12-31 DIAGNOSIS — Z87891 Personal history of nicotine dependence: Secondary | ICD-10-CM | POA: Insufficient documentation

## 2017-12-31 LAB — CBC
HEMATOCRIT: 32.8 % — AB (ref 36.0–46.0)
Hemoglobin: 9.2 g/dL — ABNORMAL LOW (ref 12.0–15.0)
MCH: 21.7 pg — ABNORMAL LOW (ref 26.0–34.0)
MCHC: 28 g/dL — AB (ref 30.0–36.0)
MCV: 77.5 fL — ABNORMAL LOW (ref 78.0–100.0)
Platelets: 370 10*3/uL (ref 150–400)
RBC: 4.23 MIL/uL (ref 3.87–5.11)
RDW: 21.1 % — AB (ref 11.5–15.5)
WBC: 7.2 10*3/uL (ref 4.0–10.5)

## 2017-12-31 LAB — COMPREHENSIVE METABOLIC PANEL
ALBUMIN: 3.9 g/dL (ref 3.5–5.0)
ALT: 16 U/L (ref 0–44)
AST: 16 U/L (ref 15–41)
Alkaline Phosphatase: 105 U/L (ref 38–126)
Anion gap: 11 (ref 5–15)
BILIRUBIN TOTAL: 0.7 mg/dL (ref 0.3–1.2)
BUN: 12 mg/dL (ref 8–23)
CO2: 23 mmol/L (ref 22–32)
CREATININE: 1.11 mg/dL — AB (ref 0.44–1.00)
Calcium: 9.5 mg/dL (ref 8.9–10.3)
Chloride: 104 mmol/L (ref 98–111)
GFR calc Af Amer: 56 mL/min — ABNORMAL LOW (ref >60)
GFR calc non Af Amer: 48 mL/min — ABNORMAL LOW (ref >60)
GLUCOSE: 112 mg/dL — AB (ref 70–99)
POTASSIUM: 4.3 mmol/L (ref 3.5–5.1)
Sodium: 138 mmol/L (ref 135–145)
TOTAL PROTEIN: 7.1 g/dL (ref 6.5–8.1)

## 2017-12-31 LAB — URINALYSIS, ROUTINE W REFLEX MICROSCOPIC
Bilirubin Urine: NEGATIVE
Glucose, UA: NEGATIVE mg/dL
Ketones, ur: NEGATIVE mg/dL
NITRITE: POSITIVE — AB
Protein, ur: NEGATIVE mg/dL
SPECIFIC GRAVITY, URINE: 1.018 (ref 1.005–1.030)
WBC, UA: >50 WBC/hpf — ABNORMAL HIGH (ref 0–5)
pH: 5 (ref 5.0–8.0)

## 2017-12-31 LAB — LIPASE, BLOOD: LIPASE: 27 U/L (ref 11–51)

## 2017-12-31 NOTE — ED Triage Notes (Signed)
Pt went to see PCP, abd pain for 4 days, LLQ, with nausea, hx of diverticulitis

## 2018-01-01 ENCOUNTER — Other Ambulatory Visit: Payer: Self-pay | Admitting: Cardiovascular Disease

## 2018-01-01 ENCOUNTER — Emergency Department (HOSPITAL_COMMUNITY): Payer: Medicare Other

## 2018-01-01 ENCOUNTER — Telehealth: Payer: Self-pay | Admitting: Hematology

## 2018-01-01 ENCOUNTER — Encounter: Payer: Self-pay | Admitting: Hematology

## 2018-01-01 MED ORDER — CIPROFLOXACIN HCL 500 MG PO TABS
500.0000 mg | ORAL_TABLET | Freq: Once | ORAL | Status: AC
  Administered 2018-01-01: 500 mg via ORAL
  Filled 2018-01-01: qty 1

## 2018-01-01 MED ORDER — ACETAMINOPHEN 500 MG PO TABS: 1000.0000 mg | ORAL_TABLET | Freq: Three times a day (TID) | ORAL | 0 refills | Status: AC

## 2018-01-01 MED ORDER — CIPROFLOXACIN HCL 500 MG PO TABS: 500.0000 mg | ORAL_TABLET | Freq: Two times a day (BID) | ORAL | 0 refills | Status: AC

## 2018-01-01 MED ORDER — METRONIDAZOLE 500 MG PO TABS: 500.0000 mg | ORAL_TABLET | Freq: Two times a day (BID) | ORAL | 0 refills | Status: AC

## 2018-01-01 MED ORDER — METRONIDAZOLE 500 MG PO TABS
500.0000 mg | ORAL_TABLET | Freq: Once | ORAL | Status: AC
  Administered 2018-01-01: 500 mg via ORAL
  Filled 2018-01-01: qty 1

## 2018-01-01 MED ORDER — HYDROCODONE-ACETAMINOPHEN 5-325 MG PO TABS
1.0000 | ORAL_TABLET | Freq: Once | ORAL | Status: AC
  Administered 2018-01-01: 1 via ORAL
  Filled 2018-01-01: qty 1

## 2018-01-01 MED ORDER — HYDROCODONE-ACETAMINOPHEN 5-325 MG PO TABS: 0.5000 | ORAL_TABLET | Freq: Three times a day (TID) | ORAL | 0 refills | Status: AC | PRN

## 2018-01-01 NOTE — ED Provider Notes (Signed)
[facility] EMERGENCY DEPARTMENT Provider Note  CSN: [id number] Arrival date & time: 12/31/17 2038  Chief Complaint(s) Diverticulitis  HPI Jenna Thompson is a 72 y.o. female   The history is provided by the patient.  Abdominal Pain   This is a new problem. Episode onset: 4 days. The problem occurs constantly. The problem has been gradually worsening. The pain is associated with an unknown factor. The pain is located in the suprapubic region and LLQ. The quality of the pain is aching. The pain is moderate. Associated symptoms include dysuria. Pertinent negatives include fever, hematochezia, melena, nausea, vomiting and constipation. The symptoms are aggravated by palpation. Nothing relieves the symptoms. Past medical history comments: diverticulosis.    Past Medical History Past Medical History:  Diagnosis Date  . Anemia, iron deficiency   . Arthritis    L>R knee  . Atrophic gastritis without mention of hemorrhage   . Colon polyps   . Diaphragmatic hernia without mention of obstruction or gangrene   . Diverticulosis of colon (without mention of hemorrhage)   . Hiatal hernia   . Hypertension   . STEMI (ST elevation myocardial infarction) (HCC)    1/19 STEMI with PTCA/Thrombectomy to dRCA, rPDA, normal EF.   Marland Kitchen Unspecified hemorrhoids without mention of complication    Patient Active Problem List   Diagnosis Date Noted  . Hyperlipidemia 05/05/2017  . S/P PTCA (percutaneous transluminal coronary angioplasty)   . Iron deficiency anemia   . Acute myocardial infarction (HCC)   . Primary osteoarthritis of both knees 12/20/2016  . Personal history of colonic polyps 12/26/2010   Home Medication(s) Prior to Admission medications   Medication Sig Start Date End Date Taking? Authorizing Provider  aspirin 81 MG chewable tablet Chew 1 tablet (81 mg total) by mouth daily. 05/06/17  Yes Laverda Page B, NP  atorvastatin (LIPITOR) 80 MG tablet TAKE 1 TABLET (80 MG TOTAL)  BY MOUTH DAILY AT 6 PM. 07/28/17  Yes Kathleene Hazel, MD  ferrous gluconate (FERGON) 324 MG tablet TAKE 1 TABLET BY MOUTH EVERY DAY WITH BREAKFAST Patient taking differently: Take 324 mg by mouth daily with breakfast.  09/30/17  Yes Kathleene Hazel, MD  metoprolol tartrate (LOPRESSOR) 25 MG tablet TAKE 1 TABLET BY MOUTH TWICE A DAY 07/28/17  Yes Kathleene Hazel, MD  nitroGLYCERIN (NITROSTAT) 0.4 MG SL tablet Place 1 tablet (0.4 mg total) under the tongue every 5 (five) minutes as needed. 05/05/17  Yes Arty Baumgartner, NP  ticagrelor (BRILINTA) 90 MG TABS tablet Take 1 tablet (90 mg total) by mouth 2 (two) times daily. 11/20/17  Yes Kathleene Hazel, MD  timolol (TIMOPTIC) 0.5 % ophthalmic solution Place 1 drop into both eyes daily.  11/14/10  Yes [provider]  acetaminophen (TYLENOL) 500 MG tablet Take 2 tablets (1,000 mg total) by mouth every 8 (eight) hours for 5 days. Do not take more than 4000 mg of acetaminophen (Tylenol) in a 24-hour period. Please note that other medicines that you may be prescribed may have Tylenol as well. 01/01/18 01/06/18  Nira Conn, MD  ciprofloxacin (CIPRO) 500 MG tablet Take 1 tablet (500 mg total) by mouth every 12 (twelve) hours for 10 days. 01/01/18 01/11/18  Nira Conn, MD  HYDROcodone-acetaminophen (NORCO/VICODIN) 5-325 MG tablet Take 0.5-1 tablets by mouth every 8 (eight) hours as needed for up to 5 days for severe pain (That is not improved by your scheduled acetaminophen regimen). Please do not exceed 4000 mg  of acetaminophen (Tylenol) a 24-hour period. Please note that he may be prescribed additional medicine that contains acetaminophen. 01/01/18 01/06/18  Nira Conn, MD  metroNIDAZOLE (FLAGYL) 500 MG tablet Take 1 tablet (500 mg total) by mouth 2 (two) times daily for 10 days. 01/01/18 01/11/18  Nira Conn, MD                                                                                                                                     Past Surgical History Past Surgical History:  Procedure Laterality Date  . ABDOMINAL HYSTERECTOMY  1978  . CHOLECYSTECTOMY  1979  . CORONARY ANGIOGRAPHY N/A 05/02/2017   Procedure: CORONARY ANGIOGRAPHY (CATH LAB);  Surgeon: Yvonne Kendall, MD;  Location: [facility] INVASIVE CV LAB;  Service: Cardiovascular;  Laterality: N/A;  . CORONARY BALLOON ANGIOPLASTY N/A 05/02/2017   Procedure: CORONARY BALLOON ANGIOPLASTY;  Surgeon: Yvonne Kendall, MD;  Location: [facility] INVASIVE CV LAB;  Service: Cardiovascular;  Laterality: N/A;  . CORONARY THROMBECTOMY N/A 05/02/2017   Procedure: Coronary Thrombectomy;  Surgeon: Yvonne Kendall, MD;  Location: [facility] INVASIVE CV LAB;  Service: Cardiovascular;  Laterality: N/A;  . CORONARY/GRAFT ACUTE MI REVASCULARIZATION N/A 05/01/2017   Procedure: Coronary/Graft Acute MI Revascularization;  Surgeon: Kathleene Hazel, MD;  Location: [facility] INVASIVE CV LAB;  Service: Cardiovascular;  Laterality: N/A;  . KNEE SURGERY  2001   left   . LEFT HEART CATH AND CORONARY ANGIOGRAPHY N/A 05/01/2017   Procedure: LEFT HEART CATH AND CORONARY ANGIOGRAPHY;  Surgeon: Kathleene Hazel, MD;  Location: [facility] INVASIVE CV LAB;  Service: Cardiovascular;  Laterality: N/A;  . TONSILLECTOMY AND ADENOIDECTOMY  1970   Family History Family History  Problem Relation Age of Onset  . Kidney disease Mother   . Colon cancer Neg Hx     Social History Social History   Tobacco Use  . Smoking status: Former Games developer  . Smokeless tobacco: Never Used  Substance Use Topics  . Alcohol use: No  . Drug use: No   Allergies Nitrofurantoin monohyd macro and Sulfa antibiotics  Review of Systems Review of Systems  Constitutional: Negative for fever.  Gastrointestinal: Positive for abdominal pain. Negative for constipation, hematochezia, melena, nausea and vomiting.  Genitourinary: Positive for dysuria.   All other systems are reviewed and are negative  for acute change except as noted in the HPI  Physical Exam Vital Signs  I have reviewed the triage vital signs BP (!) 150/77 (BP Location: Left Arm)   Pulse 77   Temp 98.5 F (36.9 C) (Oral)   Resp 16   SpO2 99%   Physical Exam  Constitutional: She is oriented to person, place, and time. She appears well-developed and well-nourished. No distress.  HENT:  Head: Normocephalic and atraumatic.  Right Ear: External ear normal.  Left Ear: External ear normal.  Nose: Nose normal.  Eyes: Conjunctivae and EOM are normal. No scleral icterus.  Neck: Normal  range of motion and phonation normal.  Cardiovascular: Normal rate and regular rhythm.  Pulmonary/Chest: Effort normal. No stridor. No respiratory distress.  Abdominal: She exhibits no distension. There is tenderness in the suprapubic area and left lower quadrant. There is no rigidity, no rebound, no guarding and negative Murphy's sign.  Musculoskeletal: Normal range of motion. She exhibits no edema.  Neurological: She is alert and oriented to person, place, and time.  Skin: She is not diaphoretic.  Psychiatric: She has a normal mood and affect. Her behavior is normal.  Vitals reviewed.   ED Results and Treatments Labs (all labs ordered are listed, but only abnormal results are displayed) Labs Reviewed  COMPREHENSIVE METABOLIC PANEL - Abnormal; Notable for the following components:      Result Value   Glucose, Bld 112 (*)    Creatinine, Ser 1.11 (*)    GFR calc non Af Amer 48 (*)    GFR calc Af Amer 56 (*)    All other components within normal limits  CBC - Abnormal; Notable for the following components:   Hemoglobin 9.2 (*)    HCT 32.8 (*)    MCV 77.5 (*)    MCH 21.7 (*)    MCHC 28.0 (*)    RDW 21.1 (*)    All other components within normal limits  URINALYSIS, ROUTINE W REFLEX MICROSCOPIC - Abnormal; Notable for the following components:   APPearance HAZY (*)    Hgb urine dipstick MODERATE (*)    Nitrite POSITIVE (*)     Leukocytes, UA LARGE (*)    WBC, UA >50 (*)    Bacteria, UA MANY (*)    All other components within normal limits  LIPASE, BLOOD                                                                                                                         EKG  EKG Interpretation  Date/Time:    Ventricular Rate:    PR Interval:    QRS Duration:   QT Interval:    QTC Calculation:   R Axis:     Text Interpretation:        Radiology Ct Renal Stone Study  Result Date: 01/01/2018 CLINICAL DATA:  Left lower quadrant abdominal pain for 4 days. Nausea. EXAM: CT ABDOMEN AND PELVIS WITHOUT CONTRAST TECHNIQUE: Multidetector CT imaging of the abdomen and pelvis was performed following the standard protocol without IV contrast. COMPARISON:  None. FINDINGS: Lower chest: 3 mm nodule in the right lung base, likely benign. Hepatobiliary: No focal liver abnormality is seen. Status post cholecystectomy. No biliary dilatation. Pancreas: Unremarkable. No pancreatic ductal dilatation or surrounding inflammatory changes. Spleen: Normal in size without focal abnormality. Adrenals/Urinary Tract: No adrenal gland nodules. Bilateral renal cysts, right measuring 3.7 cm and left 4.8 cm. No hydronephrosis or hydroureter. No renal, ureteral, or bladder stones. No bladder wall thickening. Stomach/Bowel: Diverticulosis of the sigmoid colon with inflammatory stranding around the sigmoid consistent with acute diverticulitis. Sigmoid  colonic wall thickening. No abscess or perforation. Postoperative changes at the EG junction. Stomach, small bowel, and colon are decompressed. Appendix is normal. Vascular/Lymphatic: Aortic atherosclerosis. No enlarged abdominal or pelvic lymph nodes. Reproductive: Status post hysterectomy. No adnexal masses. Other: No free air or free fluid in the abdomen. Moderate periumbilical hernia containing fat. Musculoskeletal: No acute or significant osseous findings. IMPRESSION: 1. Diverticulitis involving the  sigmoid colon. No abscess or perforation. Associated sigmoid colonic wall thickening. Consider follow-up after resolution of acute process to exclude underlying colon lesion. 2. Bilateral renal cysts. No renal or ureteral stone or obstruction. 3. Moderate periumbilical hernia containing fat. 4. 3 mm nodule in the right lung base, likely benign. Aortic Atherosclerosis (ICD10-I70.0). Electronically Signed   By: Burman Nieves M.D.   On: 01/01/2018 00:47   Pertinent labs & imaging results that were available during my care of the patient were reviewed by me and considered in my medical decision making (see chart for details).  Medications Ordered in ED Medications  ciprofloxacin (CIPRO) tablet 500 mg (has no administration in time range)  metroNIDAZOLE (FLAGYL) tablet 500 mg (has no administration in time range)  HYDROcodone-acetaminophen (NORCO/VICODIN) 5-325 MG per tablet 1 tablet (has no administration in time range)                                                                                                                                    Procedures Procedures  (including critical care time)  Medical Decision Making / ED Course I have reviewed the nursing notes for this encounter and the patient's prior records (if available in EHR or on provided paperwork).    Lower abd pain with TTP. H/o diverticulosis CBC without leukocytosis.  Hemoglobin improved from prior. UA consistent with urinary tract infection. Given the fact that the pain is more intense in the left lower quadrant, will obtain a CT scan to assess for stone versus diverticulitis.  CT revealed evidence of diverticulitis without abscess or perforation.  Given concomitant urinary tract infection and diverticulitis, will treat with Cipro Flagyl.  Given first dose of antibiotics in the emergency department.  Tolerating oral hydration.  The patient appears reasonably screened and/or stabilized for discharge and I doubt any  other medical condition or other [facility] requiring further screening, evaluation, or treatment in the ED at this time prior to discharge.  The patient is safe for discharge with strict return precautions.   Final Clinical Impression(s) / ED Diagnoses Final diagnoses:  Diverticulitis  Acute cystitis with hematuria   Disposition: Discharge  Condition: Good  I have discussed the results, Dx and Tx plan with the patient who expressed understanding and agree(s) with the plan. Discharge instructions discussed at great length. The patient was given strict return precautions who verbalized understanding of the instructions. No further questions at time of discharge.    ED Discharge Orders         Ordered  ciprofloxacin (CIPRO) 500 MG tablet  Every 12 hours     01/01/18 0139    metroNIDAZOLE (FLAGYL) 500 MG tablet  2 times daily     01/01/18 0139    acetaminophen (TYLENOL) 500 MG tablet  Every 8 hours     01/01/18 0139    HYDROcodone-acetaminophen (NORCO/VICODIN) 5-325 MG tablet  Every 8 hours PRN     01/01/18 0139           Follow Up: Deatra James, MD [phone] [address] [place] [address] [phone]  Schedule an appointment as soon as possible for a visit  in 5-7 days, If symptoms do not improve or  worsen     This chart was dictated using voice recognition software.  Despite best efforts to proofread,  errors can occur which can change the documentation meaning.   Nira Conn, MD 01/01/18 [id number]

## 2018-01-01 NOTE — Telephone Encounter (Signed)
Prescription was originally written upon hospital discharge. Anemia is followed by PCP.  Please send this refill to pt's PCP

## 2018-01-01 NOTE — ED Notes (Signed)
Patient transported to CT 

## 2018-01-01 NOTE — Telephone Encounter (Signed)
Pt's hematology appt has been scheduled to see Dr. Candise Che on 10/7 at 10am. Pt aware to arrive 30 minutes early. New letter mailed.

## 2018-01-01 NOTE — ED Notes (Signed)
Pt departed in NAD.  

## 2018-01-01 NOTE — Telephone Encounter (Signed)
Pt's pharmacy is requesting a refill on ferrous gluconate. Would Dr. Clifton James like to refill this medication? Please address

## 2018-01-01 NOTE — Telephone Encounter (Signed)
Jenna Thompson Bible, I am sure we don't write for this. We could refill it if necessary. chris

## 2018-01-16 NOTE — Progress Notes (Signed)
HEMATOLOGY/ONCOLOGY CONSULTATION NOTE  Date of Service: 01/19/2018  Patient Care Team: Deatra James, MD as PCP - General (Family Medicine) Kathleene Hazel, MD as PCP - Cardiology (Cardiology) Jethro Bolus, MD as Consulting Physician (Ophthalmology)  CHIEF COMPLAINTS/PURPOSE OF CONSULTATION:  Anemia   HISTORY OF PRESENTING ILLNESS:   Jenna Thompson is a wonderful 72 y.o. female who has been referred to [place] by Dr. Deatra James for evaluation and management of Anemia. The pt reports that she is doing well overall. Her PCP is Horton Marshall at [facility].   The pt presented to the ED on 12/31/17 for abdominal pain which was evaluated to be diverticulitis after a CT Renal study, as noted below. She was not given a blood transfusion since her most recent visit. The pt was discharged with Cipro and Flagyl which she completed last week.   The pt reports that she has had an hx of anemia and she was placed on Iron that mildly aided in her anemia. She is currently on iron supplements and she was placed back on Iron in January 2019. She was placed on Brilinta and 81 mg ASA with angioplasty without stent placement. She had been on nu-iron in the past and notes that she had anemia in her mid 20's. She was not taking her Rx iron pills while on the abx due to increasing abdominal pain, however, she started back on Iron Rx on yesterday. She had a colonoscopy in 2014 and she is scheduled for another on 03/31/2018. She hasn't had an endoscopy since 2007. She denies ever needing IV iron or a blood transfusion. She notes associated mild decrease in energy (doesn't affect her day), resolved hematuria with prior UTI, and pica symptoms (craving ice). She denies blood in stool, dizziness, lightheadedness, menorrhagia (she had her uterus removed in 1978 due to prolapsed uterus), weight loss, abdominal pain, and any other symptoms.   Of note prior to the patient's visit today, pt has had CT Renal Study completed  on 01/01/18 with results revealing Diverticulitis involving the sigmoid colon. No abscess or perforation. Associated sigmoid colonic wall thickening. Consider follow-up after resolution of acute process to exclude underlying colon lesion. 2. Bilateral renal cysts. No renal or ureteral stone or obstruction. 3. Moderate periumbilical hernia containing fat. 4. 3 mm nodule in the right lung base, likely benign. Aortic Atherosclerosis.  Most recent lab results (12/31/17) of CBC and CMP is as follows: all values are WNL except for HGB at 9.2, HCT at 32.8, MCV at 77.5, MCH at 21.7, MCHC at 28.0, RDW at 21.1, Glucose at 112, Creatinine at 1.11. 11/13/16 HGB Frac. W/ Solubility revealed HGB A at 97.8% and HGB A2 at 2.2%   On PMHx the pt reports a hx of iron-deficiency anemia. On Social Hx the pt reports she quit smoking in 1982. On Family Hx the pt denies a family hx of blood disorders.   MEDICAL HISTORY:  Past Medical History:  Diagnosis Date  . Anemia, iron deficiency   . Arthritis    L>R knee  . Atrophic gastritis without mention of hemorrhage   . Colon polyps   . Diaphragmatic hernia without mention of obstruction or gangrene   . Diverticulosis of colon (without mention of hemorrhage)   . Hiatal hernia   . Hypertension   . STEMI (ST elevation myocardial infarction) (HCC)    1/19 STEMI with PTCA/Thrombectomy to dRCA, rPDA, normal EF.   Marland Kitchen Unspecified hemorrhoids without mention of complication  SURGICAL HISTORY: Past Surgical History:  Procedure Laterality Date  . ABDOMINAL HYSTERECTOMY  1978  . CHOLECYSTECTOMY  1979  . CORONARY ANGIOGRAPHY N/A 05/02/2017   Procedure: CORONARY ANGIOGRAPHY (CATH LAB);  Surgeon: Yvonne Kendall, MD;  Location: [facility];  Service: Cardiovascular;  Laterality: N/A;  . CORONARY BALLOON ANGIOPLASTY N/A 05/02/2017   Procedure: CORONARY BALLOON ANGIOPLASTY;  Surgeon: Yvonne Kendall, MD;  Location: [facility];  Service: Cardiovascular;  Laterality:  N/A;  . CORONARY THROMBECTOMY N/A 05/02/2017   Procedure: Coronary Thrombectomy;  Surgeon: Yvonne Kendall, MD;  Location: [facility];  Service: Cardiovascular;  Laterality: N/A;  . CORONARY/GRAFT ACUTE MI REVASCULARIZATION N/A 05/01/2017   Procedure: Coronary/Graft Acute MI Revascularization;  Surgeon: Kathleene Hazel, MD;  Location: [facility];  Service: Cardiovascular;  Laterality: N/A;  . KNEE SURGERY  2001   left   . LEFT HEART CATH AND CORONARY ANGIOGRAPHY N/A 05/01/2017   Procedure: LEFT HEART CATH AND CORONARY ANGIOGRAPHY;  Surgeon: Kathleene Hazel, MD;  Location: [facility];  Service: Cardiovascular;  Laterality: N/A;  . TONSILLECTOMY AND ADENOIDECTOMY  1970    SOCIAL HISTORY: Social History   Socioeconomic History  . Marital status: Legally Separated    Spouse name: Not on file  . Number of children: 4  . Years of education: Not on file  . Highest education level: Not on file  Occupational History  . Occupation: Retired  Engineer, production  . Financial resource strain: Not on file  . Food insecurity:    Worry: Not on file    Inability: Not on file  . Transportation needs:    Medical: Not on file    Non-medical: Not on file  Tobacco Use  . Smoking status: Former Games developer  . Smokeless tobacco: Never Used  Substance and Sexual Activity  . Alcohol use: No  . Drug use: No  . Sexual activity: Not on file  Lifestyle  . Physical activity:    Days per week: Not on file    Minutes per session: Not on file  . Stress: Not on file  Relationships  . Social connections:    Talks on phone: Not on file    Gets together: Not on file    Attends religious service: Not on file    Active member of club or organization: Not on file    Attends meetings of clubs or organizations: Not on file    Relationship status: Not on file  . Intimate partner violence:    Fear of current or ex partner: Not on file    Emotionally abused: Not on file     Physically abused: Not on file    Forced sexual activity: Not on file  Other Topics Concern  . Not on file  Social History Narrative   2 caffeine drinks daily    FAMILY HISTORY: Family History  Problem Relation Age of Onset  . Kidney disease Mother   . Colon cancer Neg Hx     ALLERGIES:  is allergic to nitrofurantoin monohyd macro and sulfa antibiotics.  MEDICATIONS:  Current Outpatient Medications  Medication Sig Dispense Refill  . aspirin 81 MG chewable tablet Chew 1 tablet (81 mg total) by mouth daily.    Marland Kitchen atorvastatin (LIPITOR) 80 MG tablet TAKE 1 TABLET (80 MG TOTAL) BY MOUTH DAILY AT 6 PM. 90 tablet 3  . ferrous gluconate (FERGON) 324 MG tablet TAKE 1 TABLET BY MOUTH EVERY DAY WITH BREAKFAST (Patient taking  differently: Take 324 mg by mouth daily with breakfast. ) 30 tablet 3  . metoprolol tartrate (LOPRESSOR) 25 MG tablet TAKE 1 TABLET BY MOUTH TWICE A DAY 180 tablet 3  . nitroGLYCERIN (NITROSTAT) 0.4 MG SL tablet Place 1 tablet (0.4 mg total) under the tongue every 5 (five) minutes as needed. 25 tablet 2  . ticagrelor (BRILINTA) 90 MG TABS tablet Take 1 tablet (90 mg total) by mouth 2 (two) times daily. 180 tablet 0  . timolol (TIMOPTIC) 0.5 % ophthalmic solution Place 1 drop into both eyes daily.      No current facility-administered medications for this visit.     REVIEW OF SYSTEMS:    10 Point review of Systems was done is negative except as noted above.  PHYSICAL EXAMINATION:  Vitals:   01/19/18 1016  BP: (!) 153/78  Pulse: 66  Resp: 18  Temp: 97.7 F (36.5 C)  SpO2: 100%   Filed Weights   01/19/18 1016  Weight: 173 lb (78.5 kg)   Body mass index is 24.47 kg/m.  GENERAL:alert, in no acute distress and comfortable SKIN: no acute rashes, no significant lesions EYES: conjunctiva are pink and non-injected, sclera anicteric OROPHARYNX: MMM, no exudates, no oropharyngeal erythema or ulceration NECK: supple, no JVD LYMPH:  no palpable lymphadenopathy in  the cervical, axillary or inguinal regions LUNGS: clear to auscultation b/l with normal respiratory effort HEART: regular rate & rhythm ABDOMEN:  normoactive bowel sounds , non tender, not distended. Extremity: no pedal edema PSYCH: alert & oriented x 3 with fluent speech NEURO: no focal motor/sensory deficits  LABORATORY DATA:  I have reviewed the data as listed  . CBC Latest Ref Rng & Units 01/19/2018 12/31/2017 05/05/2017  WBC 3.9 - 10.3 K/uL 4.3 7.2 6.2  Hemoglobin 11.6 - 15.9 g/dL 1.6(X) 0.9(U) 7.8(L)  Hematocrit 34.8 - 46.6 % 32.5(L) 32.8(L) 26.8(L)  Platelets 145 - 400 K/uL 283 370 266    . CMP Latest Ref Rng & Units 01/19/2018 12/31/2017 06/30/2017  Glucose 70 - 99 mg/dL 88 045(W) -  BUN 8 - 23 mg/dL 13 12 -  Creatinine 0.98 - 1.00 mg/dL 1.19 1.47(W) -  Sodium 135 - 145 mmol/L 142 138 -  Potassium 3.5 - 5.1 mmol/L 4.0 4.3 -  Chloride 98 - 111 mmol/L 108 104 -  CO2 22 - 32 mmol/L 27 23 -  Calcium 8.9 - 10.3 mg/dL 9.7 9.5 -  Total Protein 6.5 - 8.1 g/dL 7.4 7.1 6.3  Total Bilirubin 0.3 - 1.2 mg/dL 0.8 0.7 0.5  Alkaline Phos 38 - 126 U/L 94 105 106  AST 15 - 41 U/L 19 16 18   ALT 0 - 44 U/L 17 16 17    . Lab Results  Component Value Date   IRON 114 01/19/2018   TIBC 266 01/19/2018   IRONPCTSAT 43 01/19/2018   (Iron and TIBC)  Lab Results  Component Value Date   FERRITIN 707 (H) 01/19/2018    Previous CBC w/diff history, Hemoglobinopathy evaluation, and Ferritin:     RADIOGRAPHIC STUDIES: I have personally reviewed the radiological images as listed and agreed with the findings in the report. Ct Renal Stone Study  Result Date: 01/01/2018 CLINICAL DATA:  Left lower quadrant abdominal pain for 4 days. Nausea. EXAM: CT ABDOMEN AND PELVIS WITHOUT CONTRAST TECHNIQUE: Multidetector CT imaging of the abdomen and pelvis was performed following the standard protocol without IV contrast. COMPARISON:  None. FINDINGS: Lower chest: 3 mm nodule in the right lung base, likely  benign. Hepatobiliary: No focal liver abnormality is seen. Status post cholecystectomy. No biliary dilatation. Pancreas: Unremarkable. No pancreatic ductal dilatation or surrounding inflammatory changes. Spleen: Normal in size without focal abnormality. Adrenals/Urinary Tract: No adrenal gland nodules. Bilateral renal cysts, right measuring 3.7 cm and left 4.8 cm. No hydronephrosis or hydroureter. No renal, ureteral, or bladder stones. No bladder wall thickening. Stomach/Bowel: Diverticulosis of the sigmoid colon with inflammatory stranding around the sigmoid consistent with acute diverticulitis. Sigmoid colonic wall thickening. No abscess or perforation. Postoperative changes at the EG junction. Stomach, small bowel, and colon are decompressed. Appendix is normal. Vascular/Lymphatic: Aortic atherosclerosis. No enlarged abdominal or pelvic lymph nodes. Reproductive: Status post hysterectomy. No adnexal masses. Other: No free air or free fluid in the abdomen. Moderate periumbilical hernia containing fat. Musculoskeletal: No acute or significant osseous findings. IMPRESSION: 1. Diverticulitis involving the sigmoid colon. No abscess or perforation. Associated sigmoid colonic wall thickening. Consider follow-up after resolution of acute process to exclude underlying colon lesion. 2. Bilateral renal cysts. No renal or ureteral stone or obstruction. 3. Moderate periumbilical hernia containing fat. 4. 3 mm nodule in the right lung base, likely benign. Aortic Atherosclerosis (ICD10-I70.0). Electronically Signed   By: Burman Nieves M.D.   On: 01/01/2018 00:47    ASSESSMENT & PLAN:   72 y.o. female with   1. Iron Deficiency Anemia PLAN -Discussed patient's most recent labs from 12/31/17, HGB at 9.2, MCV at 77.5, no leukopenia, no thrombocytopenia. -Review of the patient's previous CBCs shows HGB at 7.8 on 05/05/17, and HGB at 8.8 on 11/05/16 when Ferritin was at 193.5 -Discussed the 11/13/16 Hgb frac. W/ solubility  which revealed HGB A at 97.8% and HGB A2 at 2.2% -Discussed the 01/01/18 CT Renal which revealed Diverticulitis involving the sigmoid colon. No abscess or perforation. Associated sigmoid colonic wall thickening. Consider follow-up after resolution of acute process to exclude underlying colon lesion. 2. Bilateral renal cysts. No renal or ureteral stone or obstruction. 3. Moderate periumbilical hernia containing fat. 4. 3 mm nodule in the right lung base, likely benign. Aortic Atherosclerosis. -She has a prior hx of iron deficiency anemia and currently takes Ferrous Gluconate once daily.  -Discussed with the patient regarding the multiple risk factors for iron deficiency anemia due to being on two different blood thinner medications (brilinta and ASA), atrophic gastritis, and remote hx of polyp removal. -Discussed with the patient regarding various treatment methods including IV Iron versus PO Iron. Patient noted that she would like to treat her iron deficiency anemia with pills at this time, however, she is willing to switch to IV iron if her iron levels are decreased.  -Discussed with the patient regarding potential reasons for slow loses of iron and the need for ensuring that there is an adequate storage of iron in case.  -Also discussed with the patient regarding potential diagnosis of microcytosis anemia, however, this will be evaluated with labs obtained today.  -Discussed that the patient may have to take Iron TID for at least 60 minutes to aid in correcting her possible low iron levels and her decreased hemoglobin levels. -Labs today -given ferritin >100 and improving hgb levels - no indication for IV Iron at this time.  2) . Patient Active Problem List   Diagnosis Date Noted  . Hyperlipidemia 05/05/2017  . S/P PTCA (percutaneous transluminal coronary angioplasty)   . Iron deficiency anemia   . Acute myocardial infarction (HCC)   . Primary osteoarthritis of both knees 12/20/2016  . Personal  history of  colonic polyps 12/26/2010  -continue f/u with PCP  -RTC with Dr Candise Che in 3 months with labs   All of the patients questions were answered with apparent satisfaction. The patient knows to call the clinic with any problems, questions or concerns.  The total time spent in the appt was 45 minutes and more than 50% was on counseling and direct patient cares.    Wyvonnia Lora MD MS AAHIVMS [facility] Hematology/Oncology Physician [facility]  (Office):       [phone] (Work cell):  [phone] (Fax):           [phone]  01/19/2018 10:58 AM  I, Soijett Blue am acting as scribe for Dr. Wyvonnia Lora.  .I have reviewed the above documentation for accuracy and completeness, and I agree with the above. Johney Maine MD

## 2018-01-19 ENCOUNTER — Telehealth: Payer: Self-pay | Admitting: Hematology

## 2018-01-19 ENCOUNTER — Inpatient Hospital Stay: Payer: Medicare Other

## 2018-01-19 ENCOUNTER — Inpatient Hospital Stay: Payer: Medicare Other | Attending: Hematology | Admitting: Hematology

## 2018-01-19 ENCOUNTER — Encounter: Payer: Self-pay | Admitting: Hematology

## 2018-01-19 ENCOUNTER — Telehealth: Payer: Self-pay

## 2018-01-19 VITALS — BP 153/78 | HR 66 | Temp 97.7°F | Resp 18 | Ht 70.5 in | Wt 173.0 lb

## 2018-01-19 DIAGNOSIS — E785 Hyperlipidemia, unspecified: Secondary | ICD-10-CM | POA: Diagnosis not present

## 2018-01-19 DIAGNOSIS — I252 Old myocardial infarction: Secondary | ICD-10-CM | POA: Diagnosis not present

## 2018-01-19 DIAGNOSIS — Z79899 Other long term (current) drug therapy: Secondary | ICD-10-CM | POA: Diagnosis not present

## 2018-01-19 DIAGNOSIS — D509 Iron deficiency anemia, unspecified: Secondary | ICD-10-CM

## 2018-01-19 DIAGNOSIS — I10 Essential (primary) hypertension: Secondary | ICD-10-CM | POA: Insufficient documentation

## 2018-01-19 DIAGNOSIS — K5792 Diverticulitis of intestine, part unspecified, without perforation or abscess without bleeding: Secondary | ICD-10-CM | POA: Insufficient documentation

## 2018-01-19 DIAGNOSIS — D500 Iron deficiency anemia secondary to blood loss (chronic): Secondary | ICD-10-CM

## 2018-01-19 DIAGNOSIS — Z87891 Personal history of nicotine dependence: Secondary | ICD-10-CM | POA: Diagnosis not present

## 2018-01-19 LAB — CMP (CANCER CENTER ONLY)
ALT: 17 U/L (ref 0–44)
ANION GAP: 7 (ref 5–15)
AST: 19 U/L (ref 15–41)
Albumin: 4.2 g/dL (ref 3.5–5.0)
Alkaline Phosphatase: 94 U/L (ref 38–126)
BUN: 13 mg/dL (ref 8–23)
CHLORIDE: 108 mmol/L (ref 98–111)
CO2: 27 mmol/L (ref 22–32)
CREATININE: 0.96 mg/dL (ref 0.44–1.00)
Calcium: 9.7 mg/dL (ref 8.9–10.3)
GFR, Est AFR Am: >60 mL/min (ref >60)
GFR, Estimated: 58 mL/min — ABNORMAL LOW (ref >60)
Glucose, Bld: 88 mg/dL (ref 70–99)
Potassium: 4 mmol/L (ref 3.5–5.1)
SODIUM: 142 mmol/L (ref 135–145)
Total Bilirubin: 0.8 mg/dL (ref 0.3–1.2)
Total Protein: 7.4 g/dL (ref 6.5–8.1)

## 2018-01-19 LAB — CBC WITH DIFFERENTIAL/PLATELET
Basophils Absolute: 0.1 10*3/uL (ref 0.0–0.1)
Basophils Relative: 1 %
EOS ABS: 0 10*3/uL (ref 0.0–0.5)
Eosinophils Relative: 1 %
HCT: 32.5 % — ABNORMAL LOW (ref 34.8–46.6)
Hemoglobin: 9.6 g/dL — ABNORMAL LOW (ref 11.6–15.9)
LYMPHS ABS: 1.5 10*3/uL (ref 0.9–3.3)
Lymphocytes Relative: 34 %
MCH: 22 pg — ABNORMAL LOW (ref 25.1–34.0)
MCHC: 29.5 g/dL — ABNORMAL LOW (ref 31.5–36.0)
MCV: 74.5 fL — ABNORMAL LOW (ref 79.5–101.0)
Monocytes Absolute: 0.4 10*3/uL (ref 0.1–0.9)
Monocytes Relative: 9 %
NRBC: 12 /100{WBCs} — AB
Neutro Abs: 2.4 10*3/uL (ref 1.5–6.5)
Neutrophils Relative %: 55 %
PLATELETS: 283 10*3/uL (ref 145–400)
RBC: 4.36 MIL/uL (ref 3.70–5.45)
RDW: 22.4 % — ABNORMAL HIGH (ref 11.2–14.5)
WBC: 4.3 10*3/uL (ref 3.9–10.3)

## 2018-01-19 LAB — IRON AND TIBC
Iron: 114 ug/dL (ref 41–142)
Saturation Ratios: 43 % (ref 21–57)
TIBC: 266 ug/dL (ref 236–444)
UIBC: 152 ug/dL

## 2018-01-19 LAB — VITAMIN B12: Vitamin B-12: 827 pg/mL (ref 180–914)

## 2018-01-19 LAB — FERRITIN: FERRITIN: 707 ng/mL — AB (ref 11–307)

## 2018-01-19 NOTE — Telephone Encounter (Signed)
   [facility] Pre-operative Risk Assessment    Request for surgical clearance:  1. What type of surgery is being performed?  colonoscopy   2. When is this surgery scheduled?  03/31/18   3. What type of clearance is required (medical clearance vs. Pharmacy clearance to hold med vs. Both)?  both  4. Are there any medications that need to be held prior to surgery and how long? Brilinta 5 days   5. Practice name and name of physician performing surgery?  [facility]/Dr Outlaw   6. What is your office phone number [phone]    7.   What is your office fax number [phone]  8.   Anesthesia type (None, local, MAC, general) ?  Gwyneth Sprout  Adell Panek 01/19/2018, 1:33 PM  _________________________________________________________________   (provider comments below)

## 2018-01-19 NOTE — Telephone Encounter (Signed)
Appts scheduled avs/calendar printed per 10/7 los °

## 2018-01-20 LAB — HEMOGLOBINOPATHY EVALUATION
HGB A2 QUANT: 2 % (ref 1.8–3.2)
HGB F QUANT: 0 % (ref 0.0–2.0)
HGB S QUANTITAION: 0 %
Hgb A: 98 % (ref 96.4–98.8)
Hgb C: 0 %
Hgb Variant: 0 %

## 2018-01-20 NOTE — Telephone Encounter (Signed)
Dr. Clifton James- pt needs colonoscopy and last PCI though no stent was 04/2017-  Can she hold her Asprin and Brilinta for procedure for 5 days?

## 2018-01-20 NOTE — Telephone Encounter (Signed)
She can hold the ASA and Brilinta for the colonoscopy 5 days before.   Jenna Thompson

## 2018-01-20 NOTE — Telephone Encounter (Signed)
   Primary Cardiologist:Christopher Clifton James, MD  Chart reviewed as part of pre-operative protocol coverage. Because of Jenna Thompson's past medical history and time since last visit, he/she will require a follow-up visit in order to better assess preoperative cardiovascular risk. She has appt 02/27/18 and she can be evaluated at that time.    Pre-op covering staff: - Please call patient to inform them. Of date and time of appt. - Please contact requesting surgeon's office via preferred method (i.e, phone, fax) to inform them of need for appointment prior to surgery.  Nada Boozer, NP  01/20/2018, 4:36 PM

## 2018-02-02 LAB — ALPHA-THALASSEMIA GENOTYPR

## 2018-02-18 ENCOUNTER — Other Ambulatory Visit: Payer: Self-pay | Admitting: Cardiovascular Disease

## 2018-02-26 ENCOUNTER — Encounter: Payer: Self-pay | Admitting: Cardiovascular Disease

## 2018-02-26 NOTE — Progress Notes (Signed)
Chief Complaint  Patient presents with  . Follow-up    CAD   History of Present Illness: 72 yo female with history of CAD, HTN, prior DVT and anemia who is here today for cardiac follow up. She was admitted to [facility] on 05/01/17 with an acute inferior STEMI. Emergent cath with thrombotic occlusion of the mid to distal RCA treated with balloon angioplasty and thrombectomy. No stent was placed. She was treated with Aggrastat for 18 hours and then had repeat cath on 05/02/17 at which time the PDA was treated with aspiration thrombectomy. Echo 05/05/17 with LVEF=55%, inferior wall hypokinesis. She was discharged on ASA and Brilinta.   She is here today for follow up. The patient denies any chest pain, dyspnea, palpitations, lower extremity edema, orthopnea, PND, dizziness, near syncope or syncope.   Primary Care Physician: Deatra James, MD  Past Medical History:  Diagnosis Date  . Anemia, iron deficiency   . Arthritis    L>R knee  . Atrophic gastritis without mention of hemorrhage   . CAD (coronary artery disease)   . Colon polyps   . Diaphragmatic hernia without mention of obstruction or gangrene   . Diverticulosis of colon (without mention of hemorrhage)   . Hiatal hernia   . Hypertension   . Unspecified hemorrhoids without mention of complication     Past Surgical History:  Procedure Laterality Date  . ABDOMINAL HYSTERECTOMY  1978  . CHOLECYSTECTOMY  1979  . CORONARY ANGIOGRAPHY N/A 05/02/2017   Procedure: CORONARY ANGIOGRAPHY (CATH LAB);  Surgeon: Yvonne Kendall, MD;  [facility];  Service: Cardiovascular;  Laterality: N/A;  . CORONARY BALLOON ANGIOPLASTY N/A 05/02/2017   Procedure: CORONARY BALLOON ANGIOPLASTY;  Surgeon: Yvonne Kendall, MD;  [facility];  Service: Cardiovascular;  Laterality: N/A;  . CORONARY THROMBECTOMY N/A 05/02/2017   Procedure: Coronary Thrombectomy;  Surgeon: Yvonne Kendall, MD;  [facility];  Service:  Cardiovascular;  Laterality: N/A;  . CORONARY/GRAFT ACUTE MI REVASCULARIZATION N/A 05/01/2017   Procedure: Coronary/Graft Acute MI Revascularization;  Surgeon: Kathleene Hazel, MD;  [facility];  Service: Cardiovascular;  Laterality: N/A;  . KNEE SURGERY  2001   left   . LEFT HEART CATH AND CORONARY ANGIOGRAPHY N/A 05/01/2017   Procedure: LEFT HEART CATH AND CORONARY ANGIOGRAPHY;  Surgeon: Kathleene Hazel, MD;  [facility];  Service: Cardiovascular;  Laterality: N/A;  . TONSILLECTOMY AND ADENOIDECTOMY  1970    Current Outpatient Medications  Medication Sig Dispense Refill  . aspirin 81 MG chewable tablet Chew 1 tablet (81 mg total) by mouth daily.    Marland Kitchen atorvastatin (LIPITOR) 80 MG tablet TAKE 1 TABLET (80 MG TOTAL) BY MOUTH DAILY AT 6 PM. 90 tablet 3  . BRILINTA 90 MG TABS tablet TAKE 1 TABLET BY MOUTH 2 TIMES DAILY. 180 tablet 1  . ferrous gluconate (FERGON) 324 MG tablet TAKE 1 TABLET BY MOUTH EVERY DAY WITH BREAKFAST (Patient taking differently: Take 324 mg by mouth daily with breakfast. ) 30 tablet 3  . metoprolol tartrate (LOPRESSOR) 25 MG tablet TAKE 1 TABLET BY MOUTH TWICE A DAY 180 tablet 3  . nitroGLYCERIN (NITROSTAT) 0.4 MG SL tablet Place 1 tablet (0.4 mg total) under the tongue every 5 (five) minutes as needed. 25 tablet 2  . timolol (TIMOPTIC) 0.5 % ophthalmic solution Place 1 drop into both eyes daily.      No current facility-administered medications for this visit.  Allergies  Allergen Reactions  . Nitrofurantoin Monohyd Macro Nausea And Vomiting and Other (See Comments)    Stomach pain  . Sulfa Antibiotics Nausea And Vomiting and Other (See Comments)    Lethargic, tired    Social History   Socioeconomic History  . Marital status: Legally Separated    Spouse name: Not on file  . Number of children: 4  . Years of education: Not on file  . Highest education level: Not on file  Occupational History  . Occupation:  Retired  Engineer, production  . Financial resource strain: Not on file  . Food insecurity:    Worry: Not on file    Inability: Not on file  . Transportation needs:    Medical: Not on file    Non-medical: Not on file  Tobacco Use  . Smoking status: Former Games developer  . Smokeless tobacco: Never Used  Substance and Sexual Activity  . Alcohol use: No  . Drug use: No  . Sexual activity: Not on file  Lifestyle  . Physical activity:    Days per week: Not on file    Minutes per session: Not on file  . Stress: Not on file  Relationships  . Social connections:    Talks on phone: Not on file    Gets together: Not on file    Attends religious service: Not on file    Active member of club or organization: Not on file    Attends meetings of clubs or organizations: Not on file    Relationship status: Not on file  . Intimate partner violence:    Fear of current or ex partner: Not on file    Emotionally abused: Not on file    Physically abused: Not on file    Forced sexual activity: Not on file  Other Topics Concern  . Not on file  Social History Narrative   2 caffeine drinks daily    Family History  Problem Relation Age of Onset  . Kidney disease Mother   . Colon cancer Neg Hx     Review of Systems:  As stated in the HPI and otherwise negative.   BP 136/84   Pulse 62   Ht 5' 10.5" (1.791 m)   Wt 174 lb 12.8 oz (79.3 kg)   SpO2 99%   BMI 24.73 kg/m   Physical Examination: General: Well developed, well nourished, NAD  HEENT: OP clear, mucus membranes moist  SKIN: warm, dry. No rashes. Neuro: No focal deficits  Musculoskeletal: Muscle strength 5/5 all ext  Psychiatric: Mood and affect normal  Neck: No JVD, no carotid bruits, no thyromegaly, no lymphadenopathy.  Lungs:Clear bilaterally, no wheezes, rhonci, crackles Cardiovascular: Regular rate and rhythm. No murmurs, gallops or rubs. Abdomen:Soft. Bowel sounds present. Non-tender.  Extremities: No lower extremity edema. Pulses are  2 + in the bilateral DP/PT.  Echo January 2019: Left ventricle: Inferobasal hypokinesis The cavity size was normal. Wall thickness was increased in a pattern of moderate LVH. Systolic function was normal. The estimated ejection fraction was 55%. Wall motion was normal; there were no regional wall motion abnormalities. Doppler parameters are consistent with abnormal left ventricular relaxation (grade 1 diastolic dysfunction). - Mitral valve: There was mild regurgitation. - Left atrium: The atrium was mildly dilated. - Atrial septum: No defect or patent foramen ovale was identified. - Pulmonary arteries: PA peak pressure: 33 mm Hg (S).  EKG:  EKG is ordered today. The ekg ordered today demonstrates sinus with unusual p wave axis,  unchanged. Inferior Q waves, unchanged. T wave inversions inferior and lateral unchanged.   Recent Labs: 05/04/2017: Magnesium 2.0 01/19/2018: ALT 17; BUN 13; Creatinine 0.96; Hemoglobin 9.6; Platelets 283; Potassium 4.0; Sodium 142   Lipid Panel    Component Value Date/Time   CHOL 107 06/30/2017 0840   TRIG 66 06/30/2017 0840   HDL 40 06/30/2017 0840   CHOLHDL 2.7 06/30/2017 0840   CHOLHDL 4.6 05/01/2017 0637   VLDL 20 05/01/2017 0637   LDLCALC 54 06/30/2017 0840     Wt Readings from Last 3 Encounters:  02/27/18 174 lb 12.8 oz (79.3 kg)  01/19/18 173 lb (78.5 kg)  08/15/17 173 lb 9.6 oz (78.7 kg)     Other studies Reviewed: Additional studies/ records that were reviewed today include: . Review of the above records demonstrates:   Assessment and Plan:   1. CAD without angina: She had an inferior STEMI in January 2019 secondary to thrombotic occlusion of hte mid RCA. This was treated with balloon angioplasty and thrombectomy. No stent was placed. Mild disease noted in the LAD and Circumflex. She has done well on ASA and Brilinta.No chest pain. Continue ASA, statin and beta blocker. Will change to Plavix at next visit in April 2019.  She  can hold her Brilinta 5 days prior to her colonoscopy.   2. HLD: Lipids well controlled. Continue statin.    3. HTN: BP is controlled.   4. Pre-operative cardiovascular risk assessment: She can proceed with her planned colonoscopy. She can hold Brilinta 5 days prior to her procedure.   Current medicines are reviewed at length with the patient today.  The patient does not have concerns regarding medicines.  The following changes have been made:  no change  Labs/ tests ordered today include:  No orders of the defined types were placed in this encounter.   Disposition:   FU with me in 12 months   Signed, Verne Carrow, MD 02/27/2018 8:33 AM    [facility] [address] [place], [place], [place]  [address] Phone: [phone]; Fax: [phone]

## 2018-02-27 ENCOUNTER — Encounter: Payer: Self-pay | Admitting: Cardiovascular Disease

## 2018-02-27 ENCOUNTER — Encounter (INDEPENDENT_AMBULATORY_CARE_PROVIDER_SITE_OTHER): Payer: Self-pay

## 2018-02-27 ENCOUNTER — Ambulatory Visit: Payer: Medicare Other | Admitting: Cardiovascular Disease

## 2018-02-27 VITALS — BP 136/84 | HR 62 | Ht 70.5 in | Wt 174.8 lb

## 2018-02-27 DIAGNOSIS — I2510 Atherosclerotic heart disease of native coronary artery without angina pectoris: Secondary | ICD-10-CM | POA: Diagnosis not present

## 2018-02-27 DIAGNOSIS — E785 Hyperlipidemia, unspecified: Secondary | ICD-10-CM

## 2018-02-27 DIAGNOSIS — I10 Essential (primary) hypertension: Secondary | ICD-10-CM | POA: Diagnosis not present

## 2018-02-27 NOTE — Patient Instructions (Addendum)
Medication Instructions:  Your physician recommends that you continue on your current medications as directed. Please refer to the Current Medication list given to you today.  If you need a refill on your cardiac medications before your next appointment, please call your pharmacy.   Lab work: none If you have labs (blood work) drawn today and your tests are completely normal, you will receive your results only by: Marland Kitchen MyChart Message (if you have MyChart) OR . A paper copy in the mail If you have any lab test that is abnormal or we need to change your treatment, we will call you to review the results.  Testing/Procedures: none  Follow-Up: Your physician recommends that you schedule a follow-up appointment at the end of March.  Scheduled for March 26,2020 at 10:20

## 2018-02-27 NOTE — Telephone Encounter (Signed)
Office note from today faxed to Dr. Hulen Shouts office

## 2018-02-27 NOTE — Addendum Note (Signed)
Addended by: Lonnette Shrode S on: 02/27/2018 03:06 PM   Modules accepted: Orders  

## 2018-04-20 NOTE — Progress Notes (Signed)
HEMATOLOGY/ONCOLOGY CLINIC NOTE  Date of Service: 04/21/2018  Patient Care Team: Deatra James, MD as PCP - General (Family Medicine) Kathleene Hazel, MD as PCP - Cardiology (Cardiology) Jethro Bolus, MD as Consulting Physician (Ophthalmology)  CHIEF COMPLAINTS/PURPOSE OF CONSULTATION:  Anemia   HISTORY OF PRESENTING ILLNESS:   Jenna Thompson is a wonderful 73 y.o. female who has been referred to [place] by Dr. Deatra James for evaluation and management of Anemia. The pt reports that she is doing well overall. Her PCP is Horton Marshall at [facility].   The pt presented to the ED on 12/31/17 for abdominal pain which was evaluated to be diverticulitis after a CT Renal study, as noted below. She was not given a blood transfusion since her most recent visit. The pt was discharged with Cipro and Flagyl which she completed last week.   The pt reports that she has had an hx of anemia and she was placed on Iron that mildly aided in her anemia. She is currently on iron supplements and she was placed back on Iron in January 2019. She was placed on Brilinta and 81 mg ASA with angioplasty without stent placement. She had been on nu-iron in the past and notes that she had anemia in her mid 20's. She was not taking her Rx iron pills while on the abx due to increasing abdominal pain, however, she started back on Iron Rx on yesterday. She had a colonoscopy in 2014 and she is scheduled for another on 03/31/2018. She hasn't had an endoscopy since 2007. She denies ever needing IV iron or a blood transfusion. She notes associated mild decrease in energy (doesn't affect her day), resolved hematuria with prior UTI, and pica symptoms (craving ice). She denies blood in stool, dizziness, lightheadedness, menorrhagia (she had her uterus removed in 1978 due to prolapsed uterus), weight loss, abdominal pain, and any other symptoms.   Of note prior to the patient's visit today, pt has had CT Renal Study completed on  01/01/18 with results revealing Diverticulitis involving the sigmoid colon. No abscess or perforation. Associated sigmoid colonic wall thickening. Consider follow-up after resolution of acute process to exclude underlying colon lesion. 2. Bilateral renal cysts. No renal or ureteral stone or obstruction. 3. Moderate periumbilical hernia containing fat. 4. 3 mm nodule in the right lung base, likely benign. Aortic Atherosclerosis.  Most recent lab results (12/31/17) of CBC and CMP is as follows: all values are WNL except for HGB at 9.2, HCT at 32.8, MCV at 77.5, MCH at 21.7, MCHC at 28.0, RDW at 21.1, Glucose at 112, Creatinine at 1.11. 11/13/16 HGB Frac. W/ Solubility revealed HGB A at 97.8% and HGB A2 at 2.2%   On PMHx the pt reports a hx of iron-deficiency anemia. On Social Hx the pt reports she quit smoking in 1982. On Family Hx the pt denies a family hx of blood disorders.   Interval History:   Jenna Thompson returns today for management and evaluation of her Iron deficiency anemia. The patient's last visit with [place] was on 01/19/18. The pt reports that she is doing well overall.   The pt reports that she has not noticed any blood in the stools or black stools. She will be pursuing a colonoscopy on 04/27/18. She notes that she has continued taking PO Ferrous sulfate once a day for iron replacement. She endorses stable energy levels.   The pt notes that in her 55s and 30s her baseline HGB was in the  9-11 range, lowest at 7, and has had anemia all her life.  Lab results today (04/21/18) of CBC w/diff and CMP is as follows: all values are WNL except for HGB at 9.0, HCT at 31.4, MCV at 76.6, MCH at 22.0, MCHC at 28.7, RDW at 21.9, Creatinine at 1.12, GFR at 49. 04/21/18 Iron and TIBC revealed all values WNL 04/21/18 Ferritin at 606  On review of systems, pt reports stable energy levels, and denies blood in the stools, black stools, abdominal pains, leg swelling, and any other symptoms  MEDICAL HISTORY:  Past  Medical History:  Diagnosis Date  . Anemia, iron deficiency   . Arthritis    L>R knee  . Atrophic gastritis without mention of hemorrhage   . CAD (coronary artery disease)   . Colon polyps   . Diaphragmatic hernia without mention of obstruction or gangrene   . Diverticulosis of colon (without mention of hemorrhage)   . Hiatal hernia   . Hypertension   . Unspecified hemorrhoids without mention of complication     SURGICAL HISTORY: Past Surgical History:  Procedure Laterality Date  . ABDOMINAL HYSTERECTOMY  1978  . CHOLECYSTECTOMY  1979  . CORONARY ANGIOGRAPHY N/A 05/02/2017   Procedure: CORONARY ANGIOGRAPHY (CATH LAB);  Surgeon: Yvonne KendallEnd, Christopher, MD;  [facility];  Service: Cardiovascular;  Laterality: N/A;  . CORONARY BALLOON ANGIOPLASTY N/A 05/02/2017   Procedure: CORONARY BALLOON ANGIOPLASTY;  Surgeon: Yvonne KendallEnd, Christopher, MD;  [facility];  Service: Cardiovascular;  Laterality: N/A;  . CORONARY THROMBECTOMY N/A 05/02/2017   Procedure: Coronary Thrombectomy;  Surgeon: Yvonne KendallEnd, Christopher, MD;  [facility];  Service: Cardiovascular;  Laterality: N/A;  . CORONARY/GRAFT ACUTE MI REVASCULARIZATION N/A 05/01/2017   Procedure: Coronary/Graft Acute MI Revascularization;  Surgeon: Kathleene HazelMcAlhany, Christopher D, MD;  [facility];  Service: Cardiovascular;  Laterality: N/A;  . KNEE SURGERY  2001   left   . LEFT HEART CATH AND CORONARY ANGIOGRAPHY N/A 05/01/2017   Procedure: LEFT HEART CATH AND CORONARY ANGIOGRAPHY;  Surgeon: Kathleene HazelMcAlhany, Christopher D, MD;  [facility];  Service: Cardiovascular;  Laterality: N/A;  . TONSILLECTOMY AND ADENOIDECTOMY  1970    SOCIAL HISTORY: Social History   Socioeconomic History  . Marital status: Legally Separated    Spouse name: Not on file  . Number of children: 4  . Years of education: Not on file  . Highest education level: Not on file  Occupational History  . Occupation: Retired   Engineer, productionocial Needs  . Financial resource strain: Not on file  . Food insecurity:    Worry: Not on file    Inability: Not on file  . Transportation needs:    Medical: Not on file    Non-medical: Not on file  Tobacco Use  . Smoking status: Former Games developermoker  . Smokeless tobacco: Never Used  Substance and Sexual Activity  . Alcohol use: No  . Drug use: No  . Sexual activity: Not on file  Lifestyle  . Physical activity:    Days per week: Not on file    Minutes per session: Not on file  . Stress: Not on file  Relationships  . Social connections:    Talks on phone: Not on file    Gets together: Not on file    Attends religious service: Not on file    Active member of club or organization: Not on file    Attends meetings of clubs or organizations: Not on  file    Relationship status: Not on file  . Intimate partner violence:    Fear of current or ex partner: Not on file    Emotionally abused: Not on file    Physically abused: Not on file    Forced sexual activity: Not on file  Other Topics Concern  . Not on file  Social History Narrative   2 caffeine drinks daily    FAMILY HISTORY: Family History  Problem Relation Age of Onset  . Kidney disease Mother   . Colon cancer Neg Hx     ALLERGIES:  is allergic to nitrofurantoin monohyd macro and sulfa antibiotics.  MEDICATIONS:  Current Outpatient Medications  Medication Sig Dispense Refill  . aspirin 81 MG chewable tablet Chew 1 tablet (81 mg total) by mouth daily.    Marland Kitchen atorvastatin (LIPITOR) 80 MG tablet TAKE 1 TABLET (80 MG TOTAL) BY MOUTH DAILY AT 6 PM. 90 tablet 3  . BRILINTA 90 MG TABS tablet TAKE 1 TABLET BY MOUTH 2 TIMES DAILY. 180 tablet 1  . ferrous gluconate (FERGON) 324 MG tablet TAKE 1 TABLET BY MOUTH EVERY DAY WITH BREAKFAST (Patient taking differently: Take 324 mg by mouth daily with breakfast. ) 30 tablet 3  . metoprolol tartrate (LOPRESSOR) 25 MG tablet TAKE 1 TABLET BY MOUTH TWICE A DAY 180 tablet 3  . nitroGLYCERIN  (NITROSTAT) 0.4 MG SL tablet Place 1 tablet (0.4 mg total) under the tongue every 5 (five) minutes as needed. 25 tablet 2  . timolol (TIMOPTIC) 0.5 % ophthalmic solution Place 1 drop into both eyes daily.     . folic acid (FOLVITE) 1 MG tablet Take 1 tablet (1 mg total) by mouth daily.     No current facility-administered medications for this visit.     REVIEW OF SYSTEMS:    A 10+ POINT REVIEW OF SYSTEMS WAS OBTAINED including neurology, dermatology, psychiatry, cardiac, respiratory, lymph, extremities, GI, GU, Musculoskeletal, constitutional, breasts, reproductive, HEENT.  All pertinent positives are noted in the HPI.  All others are negative.   PHYSICAL EXAMINATION:  Vitals:   04/21/18 1031  BP: (!) 143/79  Pulse: 75  Resp: 18  Temp: 98.2 F (36.8 C)  SpO2: 100%   Filed Weights   04/21/18 1031  Weight: 174 lb 14.4 oz (79.3 kg)   Body mass index is 24.74 kg/m.  GENERAL:alert, in no acute distress and comfortable SKIN: no acute rashes, no significant lesions EYES: conjunctiva are pink and non-injected, sclera anicteric OROPHARYNX: MMM, no exudates, no oropharyngeal erythema or ulceration NECK: supple, no JVD LYMPH:  no palpable lymphadenopathy in the cervical, axillary or inguinal regions LUNGS: clear to auscultation b/l with normal respiratory effort HEART: regular rate & rhythm ABDOMEN:  normoactive bowel sounds , non tender, not distended. No palpable hepatosplenomegaly.  Extremity: no pedal edema PSYCH: alert & oriented x 3 with fluent speech NEURO: no focal motor/sensory deficits   LABORATORY DATA:  I have reviewed the data as listed  . CBC Latest Ref Rng & Units 04/21/2018 01/19/2018 12/31/2017  WBC 4.0 - 10.5 K/uL 4.8 4.3 7.2  Hemoglobin 12.0 - 15.0 g/dL 9.0(L) 9.6(L) 9.2(L)  Hematocrit 36.0 - 46.0 % 31.4(L) 32.5(L) 32.8(L)  Platelets 150 - 400 K/uL 337 283 370    . CMP Latest Ref Rng & Units 04/21/2018 01/19/2018 12/31/2017  Glucose 70 - 99 mg/dL 98 88 568(L)    BUN 8 - 23 mg/dL 12 13 12   Creatinine 0.44 - 1.00 mg/dL 2.75(T) 7.00 1.74(B)  Sodium  135 - 145 mmol/L 142 142 138  Potassium 3.5 - 5.1 mmol/L 4.2 4.0 4.3  Chloride 98 - 111 mmol/L 109 108 104  CO2 22 - 32 mmol/L 25 27 23   Calcium 8.9 - 10.3 mg/dL 9.7 9.7 9.5  Total Protein 6.5 - 8.1 g/dL 6.9 7.4 7.1  Total Bilirubin 0.3 - 1.2 mg/dL 0.7 0.8 0.7  Alkaline Phos 38 - 126 U/L 97 94 105  AST 15 - 41 U/L 19 19 16   ALT 0 - 44 U/L 20 17 16    . Lab Results  Component Value Date   IRON 105 04/21/2018   TIBC 276 04/21/2018   IRONPCTSAT 38 04/21/2018   (Iron and TIBC)  Lab Results  Component Value Date   FERRITIN 606 (H) 04/21/2018    01/19/18 Alpha thalassemia GenotypR:    01/19/18 Hemoglobinopathy Evaluation:    Previous CBC w/diff history, Hemoglobinopathy evaluation, and Ferritin:     RADIOGRAPHIC STUDIES: I have personally reviewed the radiological images as listed and agreed with the findings in the report. No results found.  ASSESSMENT & PLAN:   73 y.o. female with   1. Alpha-Thalassemia Minor - newly diagnosed. 2. Anemia 11/13/16 Hgb frac. W/ solubility revealed HGB A at 97.8% and HGB A2 at 2.2%   Review of the patient's previous CBCs shows HGB at 7.8 on 05/05/17, and HGB at 8.8 on 11/05/16 when Ferritin was at 193.5   01/01/18 CT Renal revealed Diverticulitis involving the sigmoid colon. No abscess or perforation. Associated sigmoid colonic wall thickening. Consider follow-up after resolution of acute process to exclude underlying colon lesion. 2. Bilateral renal cysts. No renal or ureteral stone or obstruction. 3. Moderate periumbilical hernia containing fat. 4. 3 mm nodule in the right lung base, likely benign. Aortic Atherosclerosis.   PLAN -Discussed pt labwork today, 04/21/18; 38% Saturation ratio, Ferritin at 606, HGB stable at 9.0, MCV at 76.6 -Discussed the 01/29/18 Alpha-Thalassemia result which confirmed that the pt has Alpha-thalassemia mino -Discussed that  her alpha-thalassemia minor explains her microcytosis  -Discussed that Alpha-thalassemia is inheritable, and noted that it could be of benefit to her children to know about this mutation as well  -Per pt, she has had life-long anemia  -Discussed that if pt has infections or surgeries, her HGB can drop further than individuals without her mutation -Begin Vitamin B complex or Vitamin B12 and Folic acid  -Continue follow up with GI while taking two blood thinners, next colonoscopy on 04/27/18  -Discussed with the patient regarding the multiple risk factors for iron deficiency anemia due to being on two different blood thinner medications (brilinta and ASA), atrophic gastritis, and remote hx of polyp removal. -Will see the pt back in 6 months, and if all continues to be stable, will discharge pt back to her PCP  2) . Patient Active Problem List   Diagnosis Date Noted  . Hyperlipidemia 05/05/2017  . S/P PTCA (percutaneous transluminal coronary angioplasty)   . Iron deficiency anemia   . Acute myocardial infarction (HCC)   . Primary osteoarthritis of both knees 12/20/2016  . Personal history of colonic polyps 12/26/2010  -continue f/u with PCP   RTC with dr Candise Che with labs in 6 months   All of the patients questions were answered with apparent satisfaction. The patient knows to call the clinic with any problems, questions or concerns.  The total time spent in the appt was 25 minutes and more than 50% was on counseling and direct patient cares.  Wyvonnia Lora MD MS AAHIVMS [facility] Hematology/Oncology Physician [facility]  (Office):       [phone] (Work cell):  [phone] (Fax):           [phone]  04/21/2018 11:34 AM  I, Marcelline Mates, am acting as a scribe for Dr. Wyvonnia Lora.   .I have reviewed the above documentation for accuracy and completeness, and I agree with the above. Johney Maine MD

## 2018-04-21 ENCOUNTER — Inpatient Hospital Stay: Payer: Medicare Other | Admitting: Hematology

## 2018-04-21 ENCOUNTER — Telehealth: Payer: Self-pay

## 2018-04-21 ENCOUNTER — Inpatient Hospital Stay: Payer: Medicare Other | Attending: Hematology

## 2018-04-21 ENCOUNTER — Other Ambulatory Visit: Payer: Self-pay | Admitting: Hematology

## 2018-04-21 VITALS — BP 143/79 | HR 75 | Temp 98.2°F | Resp 18 | Ht 70.5 in | Wt 174.9 lb

## 2018-04-21 DIAGNOSIS — D563 Thalassemia minor: Secondary | ICD-10-CM | POA: Insufficient documentation

## 2018-04-21 DIAGNOSIS — D500 Iron deficiency anemia secondary to blood loss (chronic): Secondary | ICD-10-CM

## 2018-04-21 DIAGNOSIS — Z87891 Personal history of nicotine dependence: Secondary | ICD-10-CM | POA: Diagnosis not present

## 2018-04-21 DIAGNOSIS — D509 Iron deficiency anemia, unspecified: Secondary | ICD-10-CM

## 2018-04-21 DIAGNOSIS — D649 Anemia, unspecified: Secondary | ICD-10-CM | POA: Insufficient documentation

## 2018-04-21 LAB — CBC WITH DIFFERENTIAL/PLATELET
Abs Immature Granulocytes: 0.06 10*3/uL (ref 0.00–0.07)
Basophils Absolute: 0 10*3/uL (ref 0.0–0.1)
Basophils Relative: 1 %
EOS PCT: 0 %
Eosinophils Absolute: 0 10*3/uL (ref 0.0–0.5)
HEMATOCRIT: 31.4 % — AB (ref 36.0–46.0)
Hemoglobin: 9 g/dL — ABNORMAL LOW (ref 12.0–15.0)
Immature Granulocytes: 1 %
LYMPHS ABS: 1.8 10*3/uL (ref 0.7–4.0)
LYMPHS PCT: 37 %
MCH: 22 pg — AB (ref 26.0–34.0)
MCHC: 28.7 g/dL — AB (ref 30.0–36.0)
MCV: 76.6 fL — ABNORMAL LOW (ref 80.0–100.0)
MONO ABS: 0.5 10*3/uL (ref 0.1–1.0)
MONOS PCT: 11 %
Neutro Abs: 2.4 10*3/uL (ref 1.7–7.7)
Neutrophils Relative %: 50 %
Platelets: 337 10*3/uL (ref 150–400)
RBC: 4.1 MIL/uL (ref 3.87–5.11)
RDW: 21.9 % — ABNORMAL HIGH (ref 11.5–15.5)
WBC: 4.8 10*3/uL (ref 4.0–10.5)
nRBC: 0 % (ref 0.0–0.2)

## 2018-04-21 LAB — IRON AND TIBC
Iron: 105 ug/dL (ref 41–142)
Saturation Ratios: 38 % (ref 21–57)
TIBC: 276 ug/dL (ref 236–444)
UIBC: 172 ug/dL (ref 120–384)

## 2018-04-21 LAB — CMP (CANCER CENTER ONLY)
ALBUMIN: 3.8 g/dL (ref 3.5–5.0)
ALT: 20 U/L (ref 0–44)
AST: 19 U/L (ref 15–41)
Alkaline Phosphatase: 97 U/L (ref 38–126)
Anion gap: 8 (ref 5–15)
BUN: 12 mg/dL (ref 8–23)
CHLORIDE: 109 mmol/L (ref 98–111)
CO2: 25 mmol/L (ref 22–32)
CREATININE: 1.12 mg/dL — AB (ref 0.44–1.00)
Calcium: 9.7 mg/dL (ref 8.9–10.3)
GFR, EST NON AFRICAN AMERICAN: 49 mL/min — AB (ref >60)
GFR, Est AFR Am: 57 mL/min — ABNORMAL LOW (ref >60)
GLUCOSE: 98 mg/dL (ref 70–99)
Potassium: 4.2 mmol/L (ref 3.5–5.1)
Sodium: 142 mmol/L (ref 135–145)
Total Bilirubin: 0.7 mg/dL (ref 0.3–1.2)
Total Protein: 6.9 g/dL (ref 6.5–8.1)

## 2018-04-21 LAB — FERRITIN: FERRITIN: 606 ng/mL — AB (ref 11–307)

## 2018-04-21 MED ORDER — FOLIC ACID 1 MG PO TABS: 1.0000 mg | ORAL_TABLET | Freq: Every day | ORAL | Status: DC

## 2018-04-21 NOTE — Patient Instructions (Signed)
Thank you for choosing [facility] to provide your oncology and hematology care.  To afford each patient quality time with our providers, please arrive 30 minutes before your scheduled appointment time.  If you arrive late for your appointment, you may be asked to reschedule.  We strive to give you quality time with our providers, and arriving late affects you and other patients whose appointments are after yours.    If you are a no show for multiple scheduled visits, you may be dismissed from the clinic at the providers discretion.     Again, thank you for choosing [facility], our hope is that these requests will decrease the amount of time that you wait before being seen by our physicians.  ______________________________________________________________________   Should you have questions after your visit to the [facility], please contact our office at [phone] between the hours of 8:30 and 4:30 p.m.    Voicemails left after 4:30p.m will not be returned until the following business day.     For prescription refill requests, please have your pharmacy contact us directly.  Please also try to allow 48 hours for prescription requests.     Please contact the scheduling department for questions regarding scheduling.  For scheduling of procedures such as PET scans, CT scans, MRI, Ultrasound, etc please contact central scheduling at [phone].     Resources For Cancer Patients and Caregivers:    Oncolink.org:  A wonderful resource for patients and healthcare providers for information regarding your disease, ways to tract your treatment, what to expect, etc.      American Cancer Society:  800-227-2345  Can help patients locate various types of support and financial assistance   Cancer Care: 1-800-813-HOPE (4673) Provides financial assistance, online support groups, medication/co-pay assistance.     [place] DSS:  336-641-3447 Where to apply  for food stamps, Medicaid, and utility assistance   Medicare Rights Center: 800-333-4114 Helps people with Medicare understand their rights and benefits, navigate the Medicare system, and secure the quality healthcare they deserve   SCAT: 336-333-6589 [place] [employer]'s shared-ride transportation service for eligible riders who have a disability that prevents them from riding the fixed route bus.     For additional information on assistance programs please contact our social worker:   Jenna Thompson:  [phone]  

## 2018-04-21 NOTE — Telephone Encounter (Signed)
Printed avs and calender of upcoming appointment. Per 1/7 los 

## 2018-07-07 ENCOUNTER — Telehealth: Payer: Self-pay | Admitting: Cardiovascular Disease

## 2018-07-07 NOTE — Telephone Encounter (Signed)
I spoke the the patient.  She had called back and cancelled her appointment through one of the schedulers.  She is feeling ok other thank a sniffling nose and hoarse throat.  No chest pain, no SOB, no other new symptoms. Does not need refills at this time. Aware to contact the office if any concerns or active symptoms arise. Aware we will call her back to schedule her appointment in several months. Appreciative for the call/information.

## 2018-07-07 NOTE — Telephone Encounter (Signed)
I called the patient three times this am with no answer. I left a message on the last call. I told her to call our office to let our nurses know how she is feeling. If she is having no active cardiac complaints, will reschedule her visit for several months from now when the Covid 19 precautions are lifted. If she is having issues, will need to arrange a telemedicine visit for Thursday with me. Thanks, chris

## 2018-07-09 ENCOUNTER — Ambulatory Visit: Payer: Medicare Other | Admitting: Cardiovascular Disease

## 2018-07-24 ENCOUNTER — Other Ambulatory Visit: Payer: Self-pay | Admitting: Cardiovascular Disease

## 2018-08-16 ENCOUNTER — Other Ambulatory Visit: Payer: Self-pay | Admitting: Cardiovascular Disease

## 2018-10-14 ENCOUNTER — Other Ambulatory Visit: Payer: Self-pay | Admitting: Family Medicine

## 2018-10-14 DIAGNOSIS — E2839 Other primary ovarian failure: Secondary | ICD-10-CM

## 2018-10-20 ENCOUNTER — Inpatient Hospital Stay: Payer: Medicare Other | Attending: Family Medicine

## 2018-10-20 ENCOUNTER — Inpatient Hospital Stay: Payer: Medicare Other | Admitting: Hematology

## 2018-10-30 ENCOUNTER — Telehealth: Payer: Self-pay | Admitting: Cardiovascular Disease

## 2018-10-30 NOTE — Telephone Encounter (Signed)

## 2018-11-02 ENCOUNTER — Ambulatory Visit (INDEPENDENT_AMBULATORY_CARE_PROVIDER_SITE_OTHER): Payer: Medicare Other | Admitting: Cardiovascular Disease

## 2018-11-02 ENCOUNTER — Encounter: Payer: Self-pay | Admitting: Cardiovascular Disease

## 2018-11-02 ENCOUNTER — Other Ambulatory Visit: Payer: Self-pay

## 2018-11-02 VITALS — BP 144/82 | HR 74 | Ht 70.5 in | Wt 186.8 lb

## 2018-11-02 DIAGNOSIS — I10 Essential (primary) hypertension: Secondary | ICD-10-CM | POA: Diagnosis not present

## 2018-11-02 DIAGNOSIS — I2510 Atherosclerotic heart disease of native coronary artery without angina pectoris: Secondary | ICD-10-CM | POA: Diagnosis not present

## 2018-11-02 DIAGNOSIS — E785 Hyperlipidemia, unspecified: Secondary | ICD-10-CM

## 2018-11-02 NOTE — Patient Instructions (Signed)

## 2018-11-02 NOTE — Progress Notes (Signed)
Chief Complaint  Patient presents with  . Follow-up    CAD   History of Present Illness: 73 yo female with history of CAD, HTN, prior DVT and anemia who is here today for cardiac follow up. She was admitted to [facility] on 05/01/17 with an acute inferior STEMI. Emergent cath with thrombotic occlusion of the mid to distal RCA treated with balloon angioplasty and thrombectomy. No stent was placed. She was treated with Aggrastat for 18 hours and then had repeat cath on 05/02/17 at which time the PDA was treated with aspiration thrombectomy. Echo 05/05/17 with LVEF=55%, inferior wall hypokinesis. She was discharged on ASA and Brilinta.   She is here today for follow up. The patient denies any chest pain, dyspnea, palpitations, lower extremity edema, orthopnea, PND, dizziness, near syncope or syncope.   Primary Care Physician: Deatra James, MD  Past Medical History:  Diagnosis Date  . Anemia, iron deficiency   . Arthritis    L>R knee  . Atrophic gastritis without mention of hemorrhage   . CAD (coronary artery disease)   . Colon polyps   . Diaphragmatic hernia without mention of obstruction or gangrene   . Diverticulosis of colon (without mention of hemorrhage)   . Hiatal hernia   . Hypertension   . Unspecified hemorrhoids without mention of complication     Past Surgical History:  Procedure Laterality Date  . ABDOMINAL HYSTERECTOMY  1978  . CHOLECYSTECTOMY  1979  . CORONARY ANGIOGRAPHY N/A 05/02/2017   Procedure: CORONARY ANGIOGRAPHY (CATH LAB);  Surgeon: Yvonne Kendall, MD;  Location: [facility] INVASIVE CV LAB;  Service: Cardiovascular;  Laterality: N/A;  . CORONARY BALLOON ANGIOPLASTY N/A 05/02/2017   Procedure: CORONARY BALLOON ANGIOPLASTY;  Surgeon: Yvonne Kendall, MD;  Location: [facility] INVASIVE CV LAB;  Service: Cardiovascular;  Laterality: N/A;  . CORONARY THROMBECTOMY N/A 05/02/2017   Procedure: Coronary Thrombectomy;  Surgeon: Yvonne Kendall, MD;  Location: [facility];  Service:  Cardiovascular;  Laterality: N/A;  . CORONARY/GRAFT ACUTE MI REVASCULARIZATION N/A 05/01/2017   Procedure: Coronary/Graft Acute MI Revascularization;  Surgeon: Kathleene Hazel, MD;  Location: [facility]AS[facility] CV LAB;  Service: Cardiovascular;  Laterality: N/A;  . KNEE SURGERY  2001   left   . LEFT HEART CATH AND CORONARY ANGIOGRAPHY N/A 05/01/2017   Procedure: LEFT HEART CATH AND CORONARY ANGIOGRAPHY;  Surgeon: Kathleene Hazel, MD;  Location: [facility];  Service: Cardiovascular;  Laterality: N/A;  . TONSILLECTOMY AND ADENOIDECTOMY  1970    Current Outpatient Medications  Medication Sig Dispense Refill  . aspirin 81 MG chewable tablet Chew 1 tablet (81 mg total) by mouth daily.    Marland Kitchen atorvastatin (LIPITOR) 80 MG tablet TAKE 1 TABLET (80 MG TOTAL) BY MOUTH DAILY AT 6 PM. 90 tablet 3  . BRILINTA 90 MG TABS tablet TAKE 1 TABLET BY MOUTH 2 TIMES DAILY. 180 tablet 0  . ferrous gluconate (FERGON) 324 MG tablet TAKE 1 TABLET BY MOUTH EVERY DAY WITH BREAKFAST (Patient taking differently: Take 324 mg by mouth daily with breakfast. ) 30 tablet 3  . folic acid (FOLVITE) 1 MG tablet Take 1 tablet (1 mg total) by mouth daily.    . metoprolol tartrate (LOPRESSOR) 25 MG tablet TAKE 1 TABLET BY MOUTH TWICE A DAY 180 tablet 3  . nitroGLYCERIN (NITROSTAT) 0.4 MG SL tablet Place 1 tablet (0.4 mg total) under the tongue every 5 (five) minutes as needed. 25 tablet 2  . timolol (TIMOPTIC) 0.5 % ophthalmic solution Place 1  drop into both eyes daily.      No current facility-administered medications for this visit.     Allergies  Allergen Reactions  . Nitrofurantoin Monohyd Macro Nausea And Vomiting and Other (See Comments)    Stomach pain  . Sulfa Antibiotics Nausea And Vomiting and Other (See Comments)    Lethargic, tired    Social History   Socioeconomic History  . Marital status: Legally Separated    Spouse name: Not on file  . Number of children: 4  . Years of education: Not on  file  . Highest education level: Not on file  Occupational History  . Occupation: Retired  Scientific laboratory technician  . Financial resource strain: Not on file  . Food insecurity    Worry: Not on file    Inability: Not on file  . Transportation needs    Medical: Not on file    Non-medical: Not on file  Tobacco Use  . Smoking status: Former Research scientist (life sciences)  . Smokeless tobacco: Never Used  Substance and Sexual Activity  . Alcohol use: No  . Drug use: No  . Sexual activity: Not on file  Lifestyle  . Physical activity    Days per week: Not on file    Minutes per session: Not on file  . Stress: Not on file  Relationships  . Social Herbalist on phone: Not on file    Gets together: Not on file    Attends religious service: Not on file    Active member of club or organization: Not on file    Attends meetings of clubs or organizations: Not on file    Relationship status: Not on file  . Intimate partner violence    Fear of current or ex partner: Not on file    Emotionally abused: Not on file    Physically abused: Not on file    Forced sexual activity: Not on file  Other Topics Concern  . Not on file  Social History Narrative   2 caffeine drinks daily    Family History  Problem Relation Age of Onset  . Kidney disease Mother   . Colon cancer Neg Hx     Review of Systems:  As stated in the HPI and otherwise negative.   BP (!) 144/82   Pulse 74   Ht 5' 10.5" (1.791 m)   Wt 186 lb 12.8 oz (84.7 kg)   SpO2 99%   BMI 26.42 kg/m   Physical Examination: General: Well developed, well nourished, NAD  HEENT: OP clear, mucus membranes moist  SKIN: warm, dry. No rashes. Neuro: No focal deficits  Musculoskeletal: Muscle strength 5/5 all ext  Psychiatric: Mood and affect normal  Neck: No JVD, no carotid bruits, no thyromegaly, no lymphadenopathy.  Lungs:Clear bilaterally, no wheezes, rhonci, crackles Cardiovascular: Regular rate and rhythm. No murmurs, gallops or rubs. Abdomen:Soft.  Bowel sounds present. Non-tender.  Extremities: No lower extremity edema. Pulses are 2 + in the bilateral DP/PT.  Echo January 2019: Left ventricle: Inferobasal hypokinesis The cavity size was normal. Wall thickness was increased in a pattern of moderate LVH. Systolic function was normal. The estimated ejection fraction was 55%. Wall motion was normal; there were no regional wall motion abnormalities. Doppler parameters are consistent with abnormal left ventricular relaxation (grade 1 diastolic dysfunction). - Mitral valve: There was mild regurgitation. - Left atrium: The atrium was mildly dilated. - Atrial septum: No defect or patent foramen ovale was identified. - Pulmonary arteries: PA peak pressure:  33 mm Hg (S).  EKG:  EKG is ordered today. The ekg ordered today demonstrates sinus with unusual p wave axis, unchanged. Inferior Q waves, unchanged. T wave inversions inferior and lateral unchanged.   Recent Labs: 04/21/2018: ALT 20; BUN 12; Creatinine 1.12; Hemoglobin 9.0; Platelets 337; Potassium 4.2; Sodium 142   Lipid Panel    Component Value Date/Time   CHOL 107 06/30/2017 0840   TRIG 66 06/30/2017 0840   HDL 40 06/30/2017 0840   CHOLHDL 2.7 06/30/2017 0840   CHOLHDL 4.6 05/01/2017 0637   VLDL 20 05/01/2017 0637   LDLCALC 54 06/30/2017 0840     Wt Readings from Last 3 Encounters:  11/02/18 186 lb 12.8 oz (84.7 kg)  04/21/18 174 lb 14.4 oz (79.3 kg)  02/27/18 174 lb 12.8 oz (79.3 kg)     Other studies Reviewed: Additional studies/ records that were reviewed today include: . Review of the above records demonstrates:   Assessment and Plan:   1. CAD without angina: She had an inferior STEMI in January 2019 secondary to thrombotic occlusion of hte mid RCA. This was treated with balloon angioplasty and thrombectomy. No stent was placed. Mild disease noted in the LAD and Circumflex.  Will continue ASA and statin.  Will change Brilinta to Plavix when she runs  out of Brilinta in 2 months. She will call back to let us know when she is running short of Brilinta.   2. HLD: Lipids well controlled in primary care, LDL 64 June 2020. Continue statin.   3. HTN: BP is well controlled. No changes  Current medicines are reviewed at length with the patient today.  The patient does not have concerns regarding medicines.  The following changes have been made:  no change  Labs/ tests ordered today include:  No orders of the defined types were placed in this encounter.   Disposition:   F/U with me in 12 months   Signed, Verne Carrowhristopher Gaither Biehn, MD 11/02/2018 12:01 PM    [facility] [address], [place], [place]  [id number] Phone: [phone]; Fax: [phone]

## 2018-11-13 ENCOUNTER — Other Ambulatory Visit: Payer: Self-pay | Admitting: Cardiovascular Disease

## 2018-11-16 NOTE — Progress Notes (Signed)
HEMATOLOGY/ONCOLOGY CLINIC NOTE  Date of Service: 11/17/2018  Patient Care Team: Deatra James, MD as PCP - General (Family Medicine) Kathleene Hazel, MD as PCP - Cardiology (Cardiology) Jethro Bolus, MD as Consulting Physician (Ophthalmology)  CHIEF COMPLAINTS/PURPOSE OF CONSULTATION:  Anemia and alpha thalassemia minor  HISTORY OF PRESENTING ILLNESS:   Jenna Thompson is a wonderful 73 y.o. female who has been referred to [place] by Dr. Deatra James for evaluation and management of Anemia. The pt reports that she is doing well overall. Her PCP is Horton Marshall at [facility].   The pt presented to the ED on 12/31/17 for abdominal pain which was evaluated to be diverticulitis after a CT Renal study, as noted below. She was not given a blood transfusion since her most recent visit. The pt was discharged with Cipro and Flagyl which she completed last week.   The pt reports that she has had an hx of anemia and she was placed on Iron that mildly aided in her anemia. She is currently on iron supplements and she was placed back on Iron in January 2019. She was placed on Brilinta and 81 mg ASA with angioplasty without stent placement. She had been on nu-iron in the past and notes that she had anemia in her mid 20's. She was not taking her Rx iron pills while on the abx due to increasing abdominal pain, however, she started back on Iron Rx on yesterday. She had a colonoscopy in 2014 and she is scheduled for another on 03/31/2018. She hasn't had an endoscopy since 2007. She denies ever needing IV iron or a blood transfusion. She notes associated mild decrease in energy (doesn't affect her day), resolved hematuria with prior UTI, and pica symptoms (craving ice). She denies blood in stool, dizziness, lightheadedness, menorrhagia (she had her uterus removed in 1978 due to prolapsed uterus), weight loss, abdominal pain, and any other symptoms.   Of note prior to the patient's visit today, pt has had CT  Renal Study completed on 01/01/18 with results revealing Diverticulitis involving the sigmoid colon. No abscess or perforation. Associated sigmoid colonic wall thickening. Consider follow-up after resolution of acute process to exclude underlying colon lesion. 2. Bilateral renal cysts. No renal or ureteral stone or obstruction. 3. Moderate periumbilical hernia containing fat. 4. 3 mm nodule in the right lung base, likely benign. Aortic Atherosclerosis.  Most recent lab results (12/31/17) of CBC and CMP is as follows: all values are WNL except for HGB at 9.2, HCT at 32.8, MCV at 77.5, MCH at 21.7, MCHC at 28.0, RDW at 21.1, Glucose at 112, Creatinine at 1.11. 11/13/16 HGB Frac. W/ Solubility revealed HGB A at 97.8% and HGB A2 at 2.2%   On PMHx the pt reports a hx of iron-deficiency anemia. On Social Hx the pt reports she quit smoking in 1982. On Family Hx the pt denies a family hx of blood disorders.   Interval History:   Jenna Thompson returns today for management and evaluation of her anemia and alpha thalassemia minor. The patient's last visit with [place] was on 04/21/2018. The pt reports that she is doing well overall. This visit is rescheduled from 10/20/2018.  The pt reports that she has felt the same over the past 6 months. She is still taking both of her blood thinners and denies blood in the stool, black stools, and hematuria.   She notes new B/L lower leg cramps. Hx of blood clot in calf several years ago --  took Xarelto for 3-4 months.  Lab results today (11/17/2018) of CBC w/diff and CMP is as follows: all values are WNL except for HGB at 9.8, HCT at 34.3, MCV at 76.1, MCH at 21.7, MCHC at 28.6, RDW at 22.0, nRBC at 9.5, abs immature granulocytes at 0.19.  On review of systems, pt reports lower leg cramping, and denies blood in the stool, hematuria, black stools, leg swelling, leg redness, and any other symptoms.   MEDICAL HISTORY:  Past Medical History:  Diagnosis Date  . Anemia, iron  deficiency   . Arthritis    L>R knee  . Atrophic gastritis without mention of hemorrhage   . CAD (coronary artery disease)   . Colon polyps   . Diaphragmatic hernia without mention of obstruction or gangrene   . Diverticulosis of colon (without mention of hemorrhage)   . Hiatal hernia   . Hypertension   . Unspecified hemorrhoids without mention of complication     SURGICAL HISTORY: Past Surgical History:  Procedure Laterality Date  . ABDOMINAL HYSTERECTOMY  1978  . CHOLECYSTECTOMY  1979  . CORONARY ANGIOGRAPHY N/A 05/02/2017   Procedure: CORONARY ANGIOGRAPHY (CATH LAB);  Surgeon: Yvonne KendallEnd, Christopher, MD;  Location: [facility];  Service: Cardiovascular;  Laterality: N/A;  . CORONARY BALLOON ANGIOPLASTY N/A 05/02/2017   Procedure: CORONARY BALLOON ANGIOPLASTY;  Surgeon: Yvonne KendallEnd, Christopher, MD;  Location: [facility];  Service: Cardiovascular;  Laterality: N/A;  . CORONARY THROMBECTOMY N/A 05/02/2017   Procedure: Coronary Thrombectomy;  Surgeon: Yvonne KendallEnd, Christopher, MD;  Location: [facility];  Service: Cardiovascular;  Laterality: N/A;  . CORONARY/GRAFT ACUTE MI REVASCULARIZATION N/A 05/01/2017   Procedure: Coronary/Graft Acute MI Revascularization;  Surgeon: Kathleene HazelMcAlhany, Christopher D, MD;  Location: [facility];  Service: Cardiovascular;  Laterality: N/A;  . KNEE SURGERY  2001   left   . LEFT HEART CATH AND CORONARY ANGIOGRAPHY N/A 05/01/2017   Procedure: LEFT HEART CATH AND CORONARY ANGIOGRAPHY;  Surgeon: Kathleene HazelMcAlhany, Christopher D, MD;  Location: [facility];  Service: Cardiovascular;  Laterality: N/A;  . TONSILLECTOMY AND ADENOIDECTOMY  1970    SOCIAL HISTORY: Social History   Socioeconomic History  . Marital status: Legally Separated    Spouse name: Not on file  . Number of children: 4  . Years of education: Not on file  . Highest education level: Not on file  Occupational History  . Occupation: Retired  Engineer, productionocial Needs  . Financial resource strain: Not  on file  . Food insecurity    Worry: Not on file    Inability: Not on file  . Transportation needs    Medical: Not on file    Non-medical: Not on file  Tobacco Use  . Smoking status: Former Games developermoker  . Smokeless tobacco: Never Used  Substance and Sexual Activity  . Alcohol use: No  . Drug use: No  . Sexual activity: Not on file  Lifestyle  . Physical activity    Days per week: Not on file    Minutes per session: Not on file  . Stress: Not on file  Relationships  . Social Musicianconnections    Talks on phone: Not on file    Gets together: Not on file    Attends religious service: Not on file    Active member of club or organization: Not on file    Attends meetings of clubs or organizations: Not on file    Relationship status: Not on file  . Intimate partner violence  Fear of current or ex partner: Not on file    Emotionally abused: Not on file    Physically abused: Not on file    Forced sexual activity: Not on file  Other Topics Concern  . Not on file  Social History Narrative   2 caffeine drinks daily    FAMILY HISTORY: Family History  Problem Relation Age of Onset  . Kidney disease Mother   . Colon cancer Neg Hx     ALLERGIES:  is allergic to nitrofurantoin monohyd macro and sulfa antibiotics.  MEDICATIONS:  Current Outpatient Medications  Medication Sig Dispense Refill  . aspirin 81 MG chewable tablet Chew 1 tablet (81 mg total) by mouth daily.    Marland Kitchen atorvastatin (LIPITOR) 80 MG tablet TAKE 1 TABLET (80 MG TOTAL) BY MOUTH DAILY AT 6 PM. 90 tablet 3  . BRILINTA 90 MG TABS tablet TAKE 1 TABLET BY MOUTH TWICE A DAY 180 tablet 3  . ferrous gluconate (FERGON) 324 MG tablet TAKE 1 TABLET BY MOUTH EVERY DAY WITH BREAKFAST (Patient taking differently: Take 324 mg by mouth daily with breakfast. ) 30 tablet 3  . folic acid (FOLVITE) 1 MG tablet Take 1 tablet (1 mg total) by mouth daily.    . metoprolol tartrate (LOPRESSOR) 25 MG tablet TAKE 1 TABLET BY MOUTH TWICE A DAY 180  tablet 3  . nitroGLYCERIN (NITROSTAT) 0.4 MG SL tablet Place 1 tablet (0.4 mg total) under the tongue every 5 (five) minutes as needed. 25 tablet 2  . timolol (TIMOPTIC) 0.5 % ophthalmic solution Place 1 drop into both eyes daily.      No current facility-administered medications for this visit.     REVIEW OF SYSTEMS:    A 10+ POINT REVIEW OF SYSTEMS WAS OBTAINED including neurology, dermatology, psychiatry, cardiac, respiratory, lymph, extremities, GI, GU, Musculoskeletal, constitutional, breasts, reproductive, HEENT.  All pertinent positives are noted in the HPI.  All others are negative.   PHYSICAL EXAMINATION:  Vitals:   11/17/18 1456  BP: 135/84  Pulse: 86  Resp: 18  Temp: 98.3 F (36.8 C)  SpO2: 100%   Filed Weights   11/17/18 1456  Weight: 185 lb 9.6 oz (84.2 kg)   Body mass index is 26.25 kg/m.  GENERAL:alert, in no acute distress and comfortable SKIN: no acute rashes, no significant lesions EYES: conjunctiva are pink and non-injected, sclera anicteric OROPHARYNX: MMM, no exudates, no oropharyngeal erythema or ulceration NECK: supple, no JVD LYMPH:  no palpable lymphadenopathy in the cervical, axillary or inguinal regions LUNGS: clear to auscultation b/l with normal respiratory effort HEART: regular rate & rhythm ABDOMEN:  normoactive bowel sounds , non tender, not distended. No palpable hepatosplenomegaly.  Extremity: no pedal edema PSYCH: alert & oriented x 3 with fluent speech NEURO: no focal motor/sensory deficits   LABORATORY DATA:  I have reviewed the data as listed  . CBC Latest Ref Rng & Units 11/17/2018 11/17/2018 04/21/2018  WBC 4.0 - 10.5 K/uL 5.1 - 4.8  Hemoglobin 12.0 - 15.0 g/dL 9.8(L) - 9.0(L)  Hematocrit 34.0 - 46.6 % 34.0 34.3(L) 31.4(L)  Platelets 150 - 400 K/uL 302 - 337    . CMP Latest Ref Rng & Units 11/17/2018 04/21/2018 01/19/2018  Glucose 70 - 99 mg/dL 91 98 88  BUN 8 - 23 mg/dL 14 12 13   Creatinine 0.44 - 1.00 mg/dL 1.14(H) 1.12(H) 0.96   Sodium 135 - 145 mmol/L 140 142 142  Potassium 3.5 - 5.1 mmol/L 4.3 4.2 4.0  Chloride 98 -  111 mmol/L 108 109 108  CO2 22 - 32 mmol/L 24 25 27   Calcium 8.9 - 10.3 mg/dL 9.6 9.7 9.7  Total Protein 6.5 - 8.1 g/dL 7.4 6.9 7.4  Total Bilirubin 0.3 - 1.2 mg/dL 0.7 0.7 0.8  Alkaline Phos 38 - 126 U/L 138(H) 97 94  AST 15 - 41 U/L 18 19 19   ALT 0 - 44 U/L 14 20 17    01/19/18 Alpha thalassemia GenotypR:    01/19/18 Hemoglobinopathy Evaluation:    Previous CBC w/diff history, Hemoglobinopathy evaluation, and Ferritin:     RADIOGRAPHIC STUDIES: I have personally reviewed the radiological images as listed and agreed with the findings in the report. No results found.  ASSESSMENT & PLAN:   73 y.o. female with   1. Alpha-Thalassemia Minor -recently diagnosed. 2. Anemia 11/13/16 Hgb frac. W/ solubility revealed HGB A at 97.8% and HGB A2 at 2.2%   Review of the patient's previous CBCs shows HGB at 7.8 on 05/05/17, and HGB at 8.8 on 11/05/16 when Ferritin was at 193.5   01/01/18 CT Renal revealed Diverticulitis involving the sigmoid colon. No abscess or perforation. Associated sigmoid colonic wall thickening. Consider follow-up after resolution of acute process to exclude underlying colon lesion. 2. Bilateral renal cysts. No renal or ureteral stone or obstruction. 3. Moderate periumbilical hernia containing fat. 4. 3 mm nodule in the right lung base, likely benign. Aortic Atherosclerosis.   PLAN -Discussed pt labwork today, 11/17/2018; PLT normal, HGB has improved to 9.8 which is likely her baseline. -Discussed the 01/29/18 Alpha-Thalassemia result which confirmed that the pt has Alpha-thalassemia minor -Per pt, she has had life-long anemia  -Discussed with the patient regarding the multiple risk factors for iron deficiency anemia due to being on two different blood thinner medications (brilinta and ASA), atrophic gastritis, and remote hx of polyp removal. Iron labs today show no evidence of  overt deficiency. -Recommend OTC B complex OR continue folic acid and B12 -Defer questions about calf cramping to cardiologist/PCP - concern for PAD -F/U with PCP  2) . Patient Active Problem List   Diagnosis Date Noted  . Hyperlipidemia 05/05/2017  . S/P PTCA (percutaneous transluminal coronary angioplasty)   . Iron deficiency anemia   . Acute myocardial infarction (HCC)   . Primary osteoarthritis of both knees 12/20/2016  . Personal history of colonic polyps 12/26/2010  -continue f/u with PCP   RTC with Dr Candise CheKale as needed   All of the patients questions were answered with apparent satisfaction. The patient knows to call the clinic with any problems, questions or concerns.  The total time spent in the appt was 20 minutes and more than 50% was on counseling and direct patient cares.  Wyvonnia LoraGautam Kale MD MS AAHIVMS [facility] Hematology/Oncology Physician [facility]  (Office):       [phone] (Work cell):  [phone] (Fax):           [phone]  11/17/2018 3:09 PM  I, Pietro CassisEmily Tufford, am acting as a scribe for Dr. Candise CheKale  .I have reviewed the above documentation for accuracy and completeness, and I agree with the above. Johney Maine.Gautam Kishore Kale MD

## 2018-11-17 ENCOUNTER — Inpatient Hospital Stay: Payer: Medicare Other

## 2018-11-17 ENCOUNTER — Other Ambulatory Visit: Payer: Self-pay

## 2018-11-17 ENCOUNTER — Telehealth: Payer: Self-pay | Admitting: Hematology

## 2018-11-17 ENCOUNTER — Inpatient Hospital Stay: Payer: Medicare Other | Attending: Hematology | Admitting: Hematology

## 2018-11-17 VITALS — BP 135/84 | HR 86 | Temp 98.3°F | Resp 18 | Ht 70.5 in | Wt 185.6 lb

## 2018-11-17 DIAGNOSIS — Z7982 Long term (current) use of aspirin: Secondary | ICD-10-CM | POA: Diagnosis not present

## 2018-11-17 DIAGNOSIS — Z87891 Personal history of nicotine dependence: Secondary | ICD-10-CM | POA: Insufficient documentation

## 2018-11-17 DIAGNOSIS — E785 Hyperlipidemia, unspecified: Secondary | ICD-10-CM | POA: Diagnosis not present

## 2018-11-17 DIAGNOSIS — Z79899 Other long term (current) drug therapy: Secondary | ICD-10-CM | POA: Insufficient documentation

## 2018-11-17 DIAGNOSIS — D649 Anemia, unspecified: Secondary | ICD-10-CM | POA: Diagnosis present

## 2018-11-17 DIAGNOSIS — D563 Thalassemia minor: Secondary | ICD-10-CM | POA: Diagnosis present

## 2018-11-17 DIAGNOSIS — D509 Iron deficiency anemia, unspecified: Secondary | ICD-10-CM

## 2018-11-17 DIAGNOSIS — R252 Cramp and spasm: Secondary | ICD-10-CM | POA: Diagnosis not present

## 2018-11-17 DIAGNOSIS — D500 Iron deficiency anemia secondary to blood loss (chronic): Secondary | ICD-10-CM

## 2018-11-17 LAB — CBC WITH DIFFERENTIAL/PLATELET
Abs Immature Granulocytes: 0.19 10*3/uL — ABNORMAL HIGH (ref 0.00–0.07)
Basophils Absolute: 0.1 10*3/uL (ref 0.0–0.1)
Basophils Relative: 1 %
Eosinophils Absolute: 0 10*3/uL (ref 0.0–0.5)
Eosinophils Relative: 0 %
HCT: 34.3 % — ABNORMAL LOW (ref 36.0–46.0)
Hemoglobin: 9.8 g/dL — ABNORMAL LOW (ref 12.0–15.0)
Immature Granulocytes: 4 %
Lymphocytes Relative: 35 %
Lymphs Abs: 1.8 10*3/uL (ref 0.7–4.0)
MCH: 21.7 pg — ABNORMAL LOW (ref 26.0–34.0)
MCHC: 28.6 g/dL — ABNORMAL LOW (ref 30.0–36.0)
MCV: 76.1 fL — ABNORMAL LOW (ref 80.0–100.0)
Monocytes Absolute: 0.5 10*3/uL (ref 0.1–1.0)
Monocytes Relative: 9 %
Neutro Abs: 2.6 10*3/uL (ref 1.7–7.7)
Neutrophils Relative %: 51 %
Platelets: 302 10*3/uL (ref 150–400)
RBC: 4.51 MIL/uL (ref 3.87–5.11)
RDW: 22 % — ABNORMAL HIGH (ref 11.5–15.5)
WBC: 5.1 10*3/uL (ref 4.0–10.5)
nRBC: 9.5 % — ABNORMAL HIGH (ref 0.0–0.2)

## 2018-11-17 LAB — CMP (CANCER CENTER ONLY)
ALT: 14 U/L (ref 0–44)
AST: 18 U/L (ref 15–41)
Albumin: 4.1 g/dL (ref 3.5–5.0)
Alkaline Phosphatase: 138 U/L — ABNORMAL HIGH (ref 38–126)
Anion gap: 8 (ref 5–15)
BUN: 14 mg/dL (ref 8–23)
CO2: 24 mmol/L (ref 22–32)
Calcium: 9.6 mg/dL (ref 8.9–10.3)
Chloride: 108 mmol/L (ref 98–111)
Creatinine: 1.14 mg/dL — ABNORMAL HIGH (ref 0.44–1.00)
GFR, Est AFR Am: 55 mL/min — ABNORMAL LOW (ref >60)
GFR, Estimated: 48 mL/min — ABNORMAL LOW (ref >60)
Glucose, Bld: 91 mg/dL (ref 70–99)
Potassium: 4.3 mmol/L (ref 3.5–5.1)
Sodium: 140 mmol/L (ref 135–145)
Total Bilirubin: 0.7 mg/dL (ref 0.3–1.2)
Total Protein: 7.4 g/dL (ref 6.5–8.1)

## 2018-11-17 LAB — IRON AND TIBC
Iron: 106 ug/dL (ref 41–142)
Saturation Ratios: 36 % (ref 21–57)
TIBC: 293 ug/dL (ref 236–444)
UIBC: 186 ug/dL (ref 120–384)

## 2018-11-17 LAB — FERRITIN: Ferritin: 755 ng/mL — ABNORMAL HIGH (ref 11–307)

## 2018-11-17 NOTE — Telephone Encounter (Signed)
Per 8/4 los RTC with Dr Kale as needed °

## 2018-11-18 LAB — FOLATE RBC
Folate, Hemolysate: >620 ng/mL
Folate, RBC: >1824 ng/mL (ref >498)
Hematocrit: 34 % (ref 34.0–46.6)

## 2019-01-02 ENCOUNTER — Emergency Department (HOSPITAL_COMMUNITY)
Admission: EM | Admit: 2019-01-02 | Discharge: 2019-01-02 | Disposition: A | Discharge status: Home / Self Care | Payer: Medicare Other | Attending: Emergency Medicine | Admitting: Emergency Medicine

## 2019-01-02 ENCOUNTER — Other Ambulatory Visit: Payer: Self-pay

## 2019-01-02 ENCOUNTER — Emergency Department (HOSPITAL_COMMUNITY): Payer: Medicare Other

## 2019-01-02 ENCOUNTER — Encounter (HOSPITAL_COMMUNITY): Payer: Self-pay | Admitting: Student

## 2019-01-02 DIAGNOSIS — I2510 Atherosclerotic heart disease of native coronary artery without angina pectoris: Secondary | ICD-10-CM | POA: Insufficient documentation

## 2019-01-02 DIAGNOSIS — I252 Old myocardial infarction: Secondary | ICD-10-CM | POA: Diagnosis not present

## 2019-01-02 DIAGNOSIS — I10 Essential (primary) hypertension: Secondary | ICD-10-CM | POA: Insufficient documentation

## 2019-01-02 DIAGNOSIS — Z7982 Long term (current) use of aspirin: Secondary | ICD-10-CM | POA: Insufficient documentation

## 2019-01-02 DIAGNOSIS — Z79899 Other long term (current) drug therapy: Secondary | ICD-10-CM | POA: Diagnosis not present

## 2019-01-02 DIAGNOSIS — Z9861 Coronary angioplasty status: Secondary | ICD-10-CM | POA: Insufficient documentation

## 2019-01-02 DIAGNOSIS — Z87891 Personal history of nicotine dependence: Secondary | ICD-10-CM | POA: Insufficient documentation

## 2019-01-02 DIAGNOSIS — R079 Chest pain, unspecified: Secondary | ICD-10-CM | POA: Diagnosis present

## 2019-01-02 DIAGNOSIS — R0789 Other chest pain: Secondary | ICD-10-CM | POA: Diagnosis not present

## 2019-01-02 LAB — CBC
HCT: 35.7 % — ABNORMAL LOW (ref 36.0–46.0)
Hemoglobin: 10.4 g/dL — ABNORMAL LOW (ref 12.0–15.0)
MCH: 22.3 pg — ABNORMAL LOW (ref 26.0–34.0)
MCHC: 29.1 g/dL — ABNORMAL LOW (ref 30.0–36.0)
MCV: 76.6 fL — ABNORMAL LOW (ref 80.0–100.0)
Platelets: 334 10*3/uL (ref 150–400)
RBC: 4.66 MIL/uL (ref 3.87–5.11)
RDW: 22.4 % — ABNORMAL HIGH (ref 11.5–15.5)
WBC: 6.1 10*3/uL (ref 4.0–10.5)
nRBC: 8.1 % — ABNORMAL HIGH (ref 0.0–0.2)

## 2019-01-02 LAB — BASIC METABOLIC PANEL
Anion gap: 9 (ref 5–15)
BUN: 15 mg/dL (ref 8–23)
CO2: 24 mmol/L (ref 22–32)
Calcium: 9.7 mg/dL (ref 8.9–10.3)
Chloride: 105 mmol/L (ref 98–111)
Creatinine, Ser: 1.09 mg/dL — ABNORMAL HIGH (ref 0.44–1.00)
GFR calc Af Amer: 58 mL/min — ABNORMAL LOW (ref >60)
GFR calc non Af Amer: 50 mL/min — ABNORMAL LOW (ref >60)
Glucose, Bld: 125 mg/dL — ABNORMAL HIGH (ref 70–99)
Potassium: 4.5 mmol/L (ref 3.5–5.1)
Sodium: 138 mmol/L (ref 135–145)

## 2019-01-02 LAB — TROPONIN I (HIGH SENSITIVITY)
Troponin I (High Sensitivity): 26 ng/L — ABNORMAL HIGH (ref <18)
Troponin I (High Sensitivity): 33 ng/L — ABNORMAL HIGH (ref <18)

## 2019-01-02 MED ORDER — NITROGLYCERIN 0.4 MG SL SUBL
0.4000 mg | SUBLINGUAL_TABLET | Freq: Once | SUBLINGUAL | Status: AC
  Administered 2019-01-02: 10:00:00 0.4 mg via SUBLINGUAL
  Filled 2019-01-02: qty 1

## 2019-01-02 MED ORDER — ASPIRIN 81 MG PO CHEW
240.0000 mg | CHEWABLE_TABLET | Freq: Once | ORAL | Status: AC
  Administered 2019-01-02: 243 mg via ORAL
  Filled 2019-01-02: qty 3

## 2019-01-02 MED ORDER — SODIUM CHLORIDE 0.9% FLUSH: 3.0000 mL | Freq: Once | INTRAVENOUS | Status: DC

## 2019-01-02 NOTE — Consult Note (Signed)
Cardiology Consult    Patient ID: Jenna Thompson; [id number]; 02/08/1946   Admit date: 01/02/2019 Date of Consult: 01/02/2019  Primary Care Provider: Deatra James, MD Primary Cardiologist: Verne Carrow, MD   Patient Profile    Jenna Thompson is a 73 y.o. female with past medical history of CAD (s/p Inferior STEMI in 04/2017 with angioplasty and thrombectomy to mid-RCA, repeat cath later that admission with aspiration thrombectomy of rPDA and residual thrombus along distal vessel too small for intervention), HTN, HLD, and diverticulosis who is being seen today for the evaluation of chest pain at the request of Dr. Dalene Seltzer.   History of Present Illness    Ms. Capote was last examined by Dr. Clifton James in 10/2018 and denied any recent chest pain or dyspnea on exertion at that time. Was continued on ASA, Brilinta, and statin therapy with plans to change Brilinta to Plavix once finished with her current prescription.   She presented to [facility] ED this morning for evaluation of chest pain for the past 2 days. She reports having a dull aching sensation along her pectoral region which started on Thursday. Has been constant since onset and is worse with positional changes. Does not resemble her prior MI. No association with exertion. Has been lifting her grandchildren. She denies any associated dyspnea on exertion, palpitations, orthopnea, PND, or edema. Reports compliance with her current medication regimen.   Initial labs show WBC 6.1, Hgb 10.4, platelets 334, Na+ 138, K+ 4.5, and creatinine 1.09. Initial HS Troponin 26 with repeat of 33. CXR with no active cardiopulmonary disease. EKG shows NSR, HR 64, with LVH and TWI along inferior and lateral leads which is similar to her prior tracings.     Past Medical History:  Diagnosis Date  . Anemia, iron deficiency   . Arthritis    L>R knee  . Atrophic gastritis without mention of hemorrhage   . CAD (coronary artery disease)    a. s/p  Inferior STEMI in 04/2017 with angioplasty and thrombectomy to mid-RCA, repeat cath later that admission with aspiration thrombectomy of rPDA and residual thrombus along distal vessel too small for intervention  . Colon polyps   . Diaphragmatic hernia without mention of obstruction or gangrene   . Diverticulosis of colon (without mention of hemorrhage)   . Hiatal hernia   . Hypertension   . Unspecified hemorrhoids without mention of complication     Past Surgical History:  Procedure Laterality Date  . ABDOMINAL HYSTERECTOMY  1978  . CHOLECYSTECTOMY  1979  . CORONARY ANGIOGRAPHY N/A 05/02/2017   Procedure: CORONARY ANGIOGRAPHY (CATH LAB);  Surgeon: Yvonne Kendall, MD;  Location: [facility] INVASIVE CV LAB;  Service: Cardiovascular;  Laterality: N/A;  . CORONARY BALLOON ANGIOPLASTY N/A 05/02/2017   Procedure: CORONARY BALLOON ANGIOPLASTY;  Surgeon: Yvonne Kendall, MD;  Location: [facility] INVASIVE CV LAB;  Service: Cardiovascular;  Laterality: N/A;  . CORONARY THROMBECTOMY N/A 05/02/2017   Procedure: Coronary Thrombectomy;  Surgeon: Yvonne Kendall, MD;  Location: [facility] INVASIVE CV LAB;  Service: Cardiovascular;  Laterality: N/A;  . CORONARY/GRAFT ACUTE MI REVASCULARIZATION N/A 05/01/2017   Procedure: Coronary/Graft Acute MI Revascularization;  Surgeon: Kathleene Hazel, MD;  Location: [facility] INVASIVE CV LAB;  Service: Cardiovascular;  Laterality: N/A;  . KNEE SURGERY  2001   left   . LEFT HEART CATH AND CORONARY ANGIOGRAPHY N/A 05/01/2017   Procedure: LEFT HEART CATH AND CORONARY ANGIOGRAPHY;  Surgeon: Kathleene Hazel, MD;  Location: [facility] INVAS[facility] CV LAB;  Service: Cardiovascular;  Laterality: N/A;  . TONSILLECTOMY AND ADENOIDECTOMY  1970     Home Medications:  Prior to Admission medications   Medication Sig Start Date End Date Taking? Authorizing Provider  aspirin 81 MG chewable tablet Chew 1 tablet (81 mg total) by mouth daily. 05/06/17  Yes Reino Bellis B, NP  atorvastatin (LIPITOR) 80 MG  tablet TAKE 1 TABLET (80 MG TOTAL) BY MOUTH DAILY AT 6 PM. 07/24/18  Yes Burnell Blanks, MD  B Complex Vitamins (VITAMIN B COMPLEX) TABS Take 1 tablet by mouth daily.   Yes [provider]  BRILINTA 90 MG TABS tablet TAKE 1 TABLET BY MOUTH TWICE A DAY Patient taking differently: Take 90 mg by mouth 2 (two) times daily.  11/13/18  Yes Burnell Blanks, MD  metoprolol tartrate (LOPRESSOR) 25 MG tablet TAKE 1 TABLET BY MOUTH TWICE A DAY 07/24/18  Yes Burnell Blanks, MD  timolol (TIMOPTIC) 0.5 % ophthalmic solution Place 1 drop into both eyes 2 (two) times daily.  11/14/10  Yes [provider]  folic acid (FOLVITE) 1 MG tablet Take 1 tablet (1 mg total) by mouth daily. 04/21/18   Brunetta Genera, MD  nitroGLYCERIN (NITROSTAT) 0.4 MG SL tablet Place 1 tablet (0.4 mg total) under the tongue every 5 (five) minutes as needed. 05/05/17   Cheryln Manly, NP    Inpatient Medications: Scheduled Meds: . sodium chloride flush  3 mL Intravenous Once   Continuous Infusions:  PRN Meds:   Allergies:    Allergies  Allergen Reactions  . Nitrofurantoin Monohyd Macro Nausea And Vomiting and Other (See Comments)    Stomach pain  . Sulfa Antibiotics Nausea And Vomiting and Other (See Comments)    Lethargic, tired    Social History:   Social History   Socioeconomic History  . Marital status: Legally Separated    Spouse name: Not on file  . Number of children: 4  . Years of education: Not on file  . Highest education level: Not on file  Occupational History  . Occupation: Retired  Scientific laboratory technician  . Financial resource strain: Not on file  . Food insecurity    Worry: Not on file    Inability: Not on file  . Transportation needs    Medical: Not on file    Non-medical: Not on file  Tobacco Use  . Smoking status: Former Research scientist (life sciences)  . Smokeless tobacco: Never Used  Substance and Sexual Activity  . Alcohol use: No  . Drug use: No  . Sexual activity: Not on  file  Lifestyle  . Physical activity    Days per week: Not on file    Minutes per session: Not on file  . Stress: Not on file  Relationships  . Social Herbalist on phone: Not on file    Gets together: Not on file    Attends religious service: Not on file    Active member of club or organization: Not on file    Attends meetings of clubs or organizations: Not on file    Relationship status: Not on file  . Intimate partner violence    Fear of current or ex partner: Not on file    Emotionally abused: Not on file    Physically abused: Not on file    Forced sexual activity: Not on file  Other Topics Concern  . Not on file  Social History Narrative   2 caffeine drinks daily     Family History:  Family History  Problem Relation Age of Onset  . Kidney disease Mother   . Colon cancer Neg Hx       Review of Systems    General:  No chills, fever, night sweats or weight changes.  Cardiovascular:  No dyspnea on exertion, edema, orthopnea, palpitations, paroxysmal nocturnal dyspnea. Positive for chest pain.  Dermatological: No rash, lesions/masses Respiratory: No cough, dyspnea Urologic: No hematuria, dysuria Abdominal:   No nausea, vomiting, diarrhea, bright red blood per rectum, melena, or hematemesis Neurologic:  No visual changes, wkns, changes in mental status. All other systems reviewed and are otherwise negative except as noted above.  Physical Exam/Data    Vitals:   01/02/19 0812 01/02/19 1000 01/02/19 1015  BP: (!) 182/81 (!) 150/99   Pulse: 61 (!) 59 62  Resp: 16 14 11   Temp: 97.7 F (36.5 C)    TempSrc: Oral    SpO2: 100% 100% 98%   No intake or output data in the 24 hours ending 01/02/19 1123 There were no vitals filed for this visit. There is no height or weight on file to calculate BMI.   General: Pleasant female appearing in NAD Psych: Normal affect. Neuro: Alert and oriented X 3. Moves all extremities spontaneously. HEENT: Normal  Neck:  Supple without bruits or JVD. Lungs:  Resp regular and unlabored, CTA without wheezing or rales. Heart: RRR no s3, s4, or murmurs. Abdomen: Soft, non-tender, non-distended, BS + x 4.  Extremities: No clubbing, cyanosis or edema. DP/PT/Radials 2+ and equal bilaterally.   EKG:  The EKG was personally reviewed and demonstrates: NSR, HR 64, with LVH and TWI along inferior and lateral leads which is similar to her prior tracings.   Telemetry:  Telemetry was personally reviewed and demonstrates: Sinus rhythm, HR in 50's to 60's.    Labs/Studies     Relevant CV Studies:  Echocardiogram: 04/2017 Study Conclusions  - Left ventricle: Inferobasal hypokinesis The cavity size was   normal. Wall thickness was increased in a pattern of moderate   LVH. Systolic function was normal. The estimated ejection   fraction was 55%. Wall motion was normal; there were no regional   wall motion abnormalities. Doppler parameters are consistent with   abnormal left ventricular relaxation (grade 1 diastolic   dysfunction). - Mitral valve: There was mild regurgitation. - Left atrium: The atrium was mildly dilated. - Atrial septum: No defect or patent foramen ovale was identified. - Pulmonary arteries: PA peak pressure: 33 mm Hg (S). - Impressions: Normal GLS -17.  Impressions:  - Normal GLS -17.  Cardiac Catheterization: 05/01/2017  Dist RCA lesion is 100% stenosed.  Balloon angioplasty was performed using a BALLOON SAPPHIRE 2.5X15.  Post intervention, there is a 10% residual stenosis.  Ost RPDA lesion is 100% stenosed.  Mid Cx lesion is 20% stenosed.  Prox LAD lesion is 30% stenosed.  The left ventricular systolic function is normal.  LV end diastolic pressure is normal.  The left ventricular ejection fraction is 50-55% by visual estimate.  There is no mitral valve regurgitation.   1. Acute inferior STEMI secondary to occluded mid to distal RCA 2. Successful PTCA/balloon  angioplasty/aspiration thrombectomy of the mid/distal RCA 3. Mild non-obstructive disease in the Circumflex and LAD 4. Preserved LV systolic function with mild segmental wall motion abnormality  Recommendations: Will admit to ICU. I have elected not to place a stent in the distal RCA today as the vessel looked much improved after thrombectomy. The PDA remains occluded and could  not be opened. Plans for Aggrastat infusion for next 24 hours then re-look cath tomorrow. Will continue ASA/Brlilinta/metoprolol and high dose statin. Will plan echo.   Coronary Angioplasty: 04/2017 Conclusions: 1. Persistent thrombotic occlusion of proximal rPDA. 2. Successful balloon angioplasty and aspiration thrombectomy with reestablishment of flow in the rPDA.  Residual nonocclusive thrombus in the distal vessel remains, which is too distal for intervention.  Recommendations: 1. Continue tirofiban infusion for an additional 18 hours, as hemoglobin tolerates, given baseline anemia. 2. Dual antiplatelet therapy with aspirin and ticagrelor for at least 12 months. 3. Aggressive secondary prevention.  Yvonne Kendallhristopher End, MD [facility] Pager: [phone]   Laboratory Data:  Chemistry Recent Labs  Lab 01/02/19 0828  NA 138  K 4.5  CL 105  CO2 24  GLUCOSE 125*  BUN 15  CREATININE 1.09*  CALCIUM 9.7  GFRNONAA 50*  GFRAA 58*  ANIONGAP 9    No results for input(s): PROT, ALBUMIN, AST, ALT, ALKPHOS, BILITOT in the last 168 hours. Hematology Recent Labs  Lab 01/02/19 0828  WBC 6.1  RBC 4.66  HGB 10.4*  HCT 35.7*  MCV 76.6*  MCH 22.3*  MCHC 29.1*  RDW 22.4*  PLT 334   Cardiac EnzymesNo results for input(s): TROPONINI in the last 168 hours. No results for input(s): TROPIPOC in the last 168 hours.  BNPNo results for input(s): BNP, PROBNP in the last 168 hours.  DDimer No results for input(s): DDIMER in the last 168 hours.  Radiology/Studies:  Dg Chest 2 View  Result Date: 01/02/2019  CLINICAL DATA:  Chest pain EXAM: CHEST - 2 VIEW COMPARISON:  Chest x-ray dated 12/31/2012. FINDINGS: Heart size and mediastinal contours are within normal limits. Lungs are clear. No pleural effusion or pneumothorax seen. Osseous structures about the chest are unremarkable. Numerous surgical clips within the upper abdomen, midline to LEFT upper quadrant. IMPRESSION: No active cardiopulmonary disease. No evidence of pneumonia or pulmonary edema. Electronically Signed   By: Bary RichardStan  Maynard M.D.   On: 01/02/2019 09:20     Assessment & Plan    1. Atypical Chest Pain - presented with constant chest pain for the past 3 days which has been worse with positional changes and is reproducible on palpation. Does not resemble her prior angina and is not associated with exertion.  -  Initial HS Troponin 26 with repeat of 33. EKG shows NSR, HR 64, with LVH and TWI along inferior and lateral leads which is similar to her prior tracings. No clear indication for admission or further testing currently. Will arrange for hospital follow-up and if her symptoms change or progress, could consider stress testing at that time.   2. CAD - s/p Inferior STEMI in 04/2017 with angioplasty and thrombectomy to mid-RCA, repeat cath later that admission with aspiration thrombectomy of rPDA and residual thrombus along distal vessel too small for intervention as outlined above.  - continue current medication regimen with ASA, Brilinta, BB, and statin therapy.   3. HLD - FLP in 09/2018 showed LDL at 64. Continue Atorvastatin 80mg  daily.   4. HTN - BP initially elevated at 182/81, improved to 150/99 on recheck. Continue current regimen with Lopressor 25mg  BID and follow as an outpatient. If elevated in the outpatient setting and at follow-up, would consider addition of ACE-I or ARB.    For questions or updates, please contact [facility] Please consult [email or web address] for contact info under Cardiology/STEMI.  Signed, Ellsworth LennoxBrittany M  Therin Vetsch, PA-C 01/02/2019, 11:23 AM Pager: [phone]

## 2019-01-02 NOTE — Discharge Instructions (Signed)
It was my pleasure taking care of you today!  Follow up with your cardiology group.   Return to ER for new or worsening symptoms, any additional concerns.

## 2019-01-02 NOTE — ED Provider Notes (Signed)
[facility] Provider Note   CSN: [id number] Arrival date & time: 01/02/19  0807     History   Chief Complaint Chief Complaint  Patient presents with  . Chest Pain    HPI Jenna Thompson is a 73 y.o. female.     The history is provided by the patient and medical records. No language interpreter was used.  Chest Pain Associated symptoms: no palpitations    Jenna Thompson is a 73 y.o. female  with a PMH of CAD, prior STEMI who presents to the Emergency Department complaining of chest pain across chest wall with radiation to her left shoulder which began on Thursday (2 days ago).  Pain has been constant without exertional or pleuritic component.  Denies associated shortness of breath, nausea, vomiting or diaphoresis.  She states that her pain today does feel different than cardiac pain she experienced last year with her STEMI.  Patient states that she has not taken any nitroglycerin although she does have this at home.  Denies any lower extremity pain.  Reports that she always has a little swelling in her left ankle which is baseline for her, but no change in her leg swelling.   Past Medical History:  Diagnosis Date  . Anemia, iron deficiency   . Arthritis    L>R knee  . Atrophic gastritis without mention of hemorrhage   . CAD (coronary artery disease)    a. s/p Inferior STEMI in 04/2017 with angioplasty and thrombectomy to mid-RCA, repeat cath later that admission with aspiration thrombectomy of rPDA and residual thrombus along distal vessel too small for intervention  . Colon polyps   . Diaphragmatic hernia without mention of obstruction or gangrene   . Diverticulosis of colon (without mention of hemorrhage)   . Hiatal hernia   . Hypertension   . Unspecified hemorrhoids without mention of complication     Patient Active Problem List   Diagnosis Date Noted  . Hyperlipidemia 05/05/2017  . S/P PTCA (percutaneous transluminal coronary  angioplasty)   . Iron deficiency anemia   . Acute myocardial infarction (HCC)   . Primary osteoarthritis of both knees 12/20/2016  . Personal history of colonic polyps 12/26/2010    Past Surgical History:  Procedure Laterality Date  . ABDOMINAL HYSTERECTOMY  1978  . CHOLECYSTECTOMY  1979  . CORONARY ANGIOGRAPHY N/A 05/02/2017   Procedure: CORONARY ANGIOGRAPHY (CATH LAB);  Surgeon: Yvonne KendallEnd, Christopher, MD;  [facility] INV[facility] LAB;  Service: Cardiovascular;  Laterality: N/A;  . CORONARY BALLOON ANGIOPLASTY N/A 05/02/2017   Procedure: CORONARY BALLOON ANGIOPLASTY;  Surgeon: Yvonne KendallEnd, Christopher, MD;  Location: [facility] INVASIVE CV LAB;  Service: Cardiovascular;  Laterality: N/A;  . CORONARY THROMBECTOMY N/A 05/02/2017   Procedure: Coronary Thrombectomy;  Surgeon: Yvonne KendallEnd, Christopher, MD;  Location: [facility] CV LAB;  Service: Cardiovascular;  Laterality: N/A;  . CORONARY/GRAFT ACUTE MI REVASCULARIZATION N/A 05/01/2017   Procedure: Coronary/Graft Acute MI Revascularization;  Surgeon: Kathleene HazelMcAlhany, Christopher D, MD;  Location: [facility] INV[facility] CV LAB;  Service: Cardiovascular;  Laterality: N/A;  . KNEE SURGERY  2001   left   . LEFT HEART CATH AND CORONARY ANGIOGRAPHY N/A 05/01/2017   Procedure: LEFT HEART CATH AND CORONARY ANGIOGRAPHY;  Surgeon: Kathleene HazelMcAlhany, Christopher D, MD;  Location: [facility];  Service: Cardiovascular;  Laterality: N/A;  . TONSILLECTOMY AND ADENOIDECTOMY  1970     OB History   No obstetric history on file.      Home Medications    Prior  to Admission medications   Medication Sig Start Date End Date Taking? Authorizing Provider  aspirin 81 MG chewable tablet Chew 1 tablet (81 mg total) by mouth daily. 05/06/17  Yes Reino Bellis B, NP  atorvastatin (LIPITOR) 80 MG tablet TAKE 1 TABLET (80 MG TOTAL) BY MOUTH DAILY AT 6 PM. 07/24/18  Yes Burnell Blanks, MD  B Complex Vitamins (VITAMIN B COMPLEX) TABS Take 1 tablet by mouth daily.   Yes [provider]  BRILINTA  90 MG TABS tablet TAKE 1 TABLET BY MOUTH TWICE A DAY Patient taking differently: Take 90 mg by mouth 2 (two) times daily.  11/13/18  Yes Burnell Blanks, MD  metoprolol tartrate (LOPRESSOR) 25 MG tablet TAKE 1 TABLET BY MOUTH TWICE A DAY 07/24/18  Yes Burnell Blanks, MD  timolol (TIMOPTIC) 0.5 % ophthalmic solution Place 1 drop into both eyes 2 (two) times daily.  11/14/10  Yes [provider]  folic acid (FOLVITE) 1 MG tablet Take 1 tablet (1 mg total) by mouth daily. 04/21/18   Brunetta Genera, MD  nitroGLYCERIN (NITROSTAT) 0.4 MG SL tablet Place 1 tablet (0.4 mg total) under the tongue every 5 (five) minutes as needed. 05/05/17   Cheryln Manly, NP    Family History Family History  Problem Relation Age of Onset  . Kidney disease Mother   . Colon cancer Neg Hx     Social History Social History   Tobacco Use  . Smoking status: Former Research scientist (life sciences)  . Smokeless tobacco: Never Used  Substance Use Topics  . Alcohol use: No  . Drug use: No     Allergies   Nitrofurantoin monohyd macro and Sulfa antibiotics   Review of Systems Review of Systems  Cardiovascular: Positive for chest pain. Negative for palpitations and leg swelling.  All other systems reviewed and are negative.    Physical Exam Updated Vital Signs BP (!) 150/99   Pulse 62   Temp 97.7 F (36.5 C) (Oral)   Resp 11   SpO2 98%   Physical Exam Vitals signs and nursing note reviewed.  Constitutional:      General: She is not in acute distress.    Appearance: She is well-developed.  HENT:     Head: Normocephalic and atraumatic.  Neck:     Musculoskeletal: Neck supple.  Cardiovascular:     Rate and Rhythm: Normal rate and regular rhythm.     Heart sounds: Normal heart sounds. No murmur.  Pulmonary:     Effort: Pulmonary effort is normal. No respiratory distress.     Breath sounds: Normal breath sounds. No wheezing or rales.  Chest:     Chest wall: Tenderness present.  Abdominal:      General: There is no distension.     Palpations: Abdomen is soft.     Tenderness: There is no abdominal tenderness.  Musculoskeletal: Normal range of motion.        General: No tenderness.     Right lower leg: No edema.     Left lower leg: Edema (Trace pedal edema - baseline per patient) present.  Skin:    General: Skin is warm and dry.  Neurological:     Mental Status: She is alert and oriented to person, place, and time.      ED Treatments / Results  Labs (all labs ordered are listed, but only abnormal results are displayed) Labs Reviewed  BASIC METABOLIC PANEL - Abnormal; Notable for the following components:  Result Value   Glucose, Bld 125 (*)    Creatinine, Ser 1.09 (*)    GFR calc non Af Amer 50 (*)    GFR calc Af Amer 58 (*)    All other components within normal limits  CBC - Abnormal; Notable for the following components:   Hemoglobin 10.4 (*)    HCT 35.7 (*)    MCV 76.6 (*)    MCH 22.3 (*)    MCHC 29.1 (*)    RDW 22.4 (*)    nRBC 8.1 (*)    All other components within normal limits  TROPONIN I (HIGH SENSITIVITY) - Abnormal; Notable for the following components:   Troponin I (High Sensitivity) 26 (*)    All other components within normal limits  TROPONIN I (HIGH SENSITIVITY) - Abnormal; Notable for the following components:   Troponin I (High Sensitivity) 33 (*)    All other components within normal limits    EKG EKG Interpretation  Date/Time:  Saturday January 02 2019 08:14:35 EDT Ventricular Rate:  64 PR Interval:  166 QRS Duration: 84 QT Interval:  390 QTC Calculation: 402 R Axis:     Text Interpretation:  Normal sinus rhythm Minimal voltage criteria for LVH, may be normal variant Inferior infarct , age undetermined Abnormal ECG Prior ECG show inferior STEMI, abnormal ST waves inferior leads, ECG today with depression inferior leads, ST abnormality  laterally Confirmed by Alvira Monday ([id number]) on 01/02/2019 9:57:47 AM   Radiology Dg Chest  2 View  Result Date: 01/02/2019 CLINICAL DATA:  Chest pain EXAM: CHEST - 2 VIEW COMPARISON:  Chest x-ray dated 12/31/2012. FINDINGS: Heart size and mediastinal contours are within normal limits. Lungs are clear. No pleural effusion or pneumothorax seen. Osseous structures about the chest are unremarkable. Numerous surgical clips within the upper abdomen, midline to LEFT upper quadrant. IMPRESSION: No active cardiopulmonary disease. No evidence of pneumonia or pulmonary edema. Electronically Signed   By: Bary Richard M.D.   On: 01/02/2019 09:20    Procedures Procedures (including critical care time)  Medications Ordered in ED Medications  sodium chloride flush (NS) 0.9 % injection 3 mL (has no administration in time range)  nitroGLYCERIN (NITROSTAT) SL tablet 0.4 mg (0.4 mg Sublingual Given 01/02/19 0957)  aspirin chewable tablet 243 mg (243 mg Oral Given 01/02/19 0955)     Initial Impression / Assessment and Plan / ED Course  I have reviewed the triage vital signs and the nursing notes.  Pertinent labs & imaging results that were available during my care of the patient were reviewed by me and considered in my medical decision making (see chart for details).       DEHLIA FORDICE is a 73 y.o. female who presents to ED for diffuse pain across chest wall x 3 days. Not exertional nor pleuritic. No improvement when given nitro in ED. RRR.  She has tenderness to the chest wall.  Initial EKG reviewed with attending, Dr. Dalene Seltzer which is much improved from previous when she had STEMI, however does still have depression in inferior leads / ST changes. Initial trop of 26. She has been under a good amount of stress as her daughter in law had a stroke 2 weeks ago during childbirth and she has been taking care of granddaughter. Msk and anxiety possible, however given age, risk factors, elevated trop and EKG, cardiac etiology certainly must be further investigated. Will consult cardiology and obtain 2nd  trop. Also will give ASA and nitro.   Patient  reevaluated.  No improvement with nitro.  Second troponin 33 with delta change of 7.  Patient evaluated by cardiology, Dr. Cristal Deerhristopher, who does not feel patient needs admission for ongoing cardiac work-up.  Believes chest pain is atypical in nature.  Will arrange for close outpatient cardiology follow-up. Discussed this with patient as well as return precautions. She understands and agrees with plan. All questions answered.   Patient discussed with Dr. Dalene SeltzerSchlossman who agrees with treatment plan.   Final Clinical Impressions(s) / ED Diagnoses   Final diagnoses:  Atypical chest pain    ED Discharge Orders    None       Claudette Wermuth, Chase PicketJaime Pilcher, PA-C 01/02/19 1143    Alvira MondaySchlossman, Erin, MD 01/03/19 [phone]

## 2019-01-02 NOTE — ED Triage Notes (Signed)
Patient complains of CP that started Thursday with radiation to back. Reports that the pain resolved with no NTG. Recurrent pain yesterday and ongoing discomfort. Alert and oriented.

## 2019-01-21 ENCOUNTER — Other Ambulatory Visit: Payer: Self-pay | Admitting: Cardiovascular Disease

## 2019-01-21 MED ORDER — CLOPIDOGREL BISULFATE 75 MG PO TABS: 75.0000 mg | ORAL_TABLET | Freq: Every day | ORAL | 3 refills | Status: DC

## 2019-01-21 NOTE — Telephone Encounter (Signed)
Pt calling stating that Dr. Angelena Form told her to call when she ran out of her Brilinta, because he wanted her to start taking Plavix. Pt states that she is out of Brilinta and needed a new Rx sent to her pharmacy for Plavix. Please address

## 2019-01-21 NOTE — Telephone Encounter (Signed)
We can start her on Plavix 75 mg daily. Thanks, chris

## 2019-01-21 NOTE — Telephone Encounter (Signed)
I spoke with pt.  She has a few days left of Brilinta so will finish her current supply and then make change to Clopidogrel 75 mg daily,. Will send new prescription to [facility] on [address].

## 2019-05-24 ENCOUNTER — Ambulatory Visit: Payer: Medicare Other

## 2019-07-21 ENCOUNTER — Other Ambulatory Visit: Payer: Self-pay | Admitting: Cardiovascular Disease

## 2019-10-15 ENCOUNTER — Other Ambulatory Visit: Payer: Self-pay | Admitting: Family Medicine

## 2019-10-15 DIAGNOSIS — E2839 Other primary ovarian failure: Secondary | ICD-10-CM

## 2019-10-20 ENCOUNTER — Other Ambulatory Visit: Payer: Self-pay | Admitting: Cardiovascular Disease

## 2019-10-27 NOTE — Progress Notes (Signed)
Cardiology Office Note    Date:  11/02/2019   ID:  Jenna, Thompson 17-Mar-1946, MRN [id number]  PCP:  Deatra James, MD  Cardiologist: Verne Carrow, MD EPS: None  Chief Complaint  Patient presents with  . Follow-up    History of Present Illness:  Jenna Thompson is a 74 y.o. female with history of CAD status post acute inferior STEMI 05/01/2017 treated with balloon angioplasty and thrombectomy to the mid distal RCA.  No stent was placed.  Repeat cath 05/02/2017 at which time the PDA was treated with aspiration  thrombectomy.  Echo 04/04/2018 LVEF 55% with inferior wall hypokinesis also has history of hypertension, prior DVT, and anemia.  Patient last saw Dr. Clifton James 11/02/2018 and was doing well.  Brilinta changed to Plavix. Patient went to ED 01/02/2019 with chest pain felt to be musculoskeletal and stress related.  Patient comes in for yearly f/u. Denies chest pain, dyspnea, dyspnea on exertion, dizziness or presyncope. No regular exercise because of knee problems. May need a replacement. Occasional fluttering if she lays on her left side-very short lived.    Past Medical History:  Diagnosis Date  . Anemia, iron deficiency   . Arthritis    L>R knee  . Atrophic gastritis without mention of hemorrhage   . CAD (coronary artery disease)    a. s/p Inferior STEMI in 04/2017 with angioplasty and thrombectomy to mid-RCA, repeat cath later that admission with aspiration thrombectomy of rPDA and residual thrombus along distal vessel too small for intervention  . Colon polyps   . Diaphragmatic hernia without mention of obstruction or gangrene   . Diverticulosis of colon (without mention of hemorrhage)   . Hiatal hernia   . Hypertension   . Unspecified hemorrhoids without mention of complication     Past Surgical History:  Procedure Laterality Date  . ABDOMINAL HYSTERECTOMY  1978  . CHOLECYSTECTOMY  1979  . CORONARY ANGIOGRAPHY N/A 05/02/2017   Procedure: CORONARY ANGIOGRAPHY  (CATH LAB);  Surgeon: Yvonne Kendall, MD;  [facility];  Service: Cardiovascular;  Laterality: N/A;  . CORONARY BALLOON ANGIOPLASTY N/A 05/02/2017   Procedure: CORONARY BALLOON ANGIOPLASTY;  Surgeon: Yvonne Kendall, MD;  [facility];  Service: Cardiovascular;  Laterality: N/A;  . CORONARY THROMBECTOMY N/A 05/02/2017   Procedure: Coronary Thrombectomy;  Surgeon: Yvonne Kendall, MD;  [facility];  Service: Cardiovascular;  Laterality: N/A;  . CORONARY/GRAFT ACUTE MI REVASCULARIZATION N/A 05/01/2017   Procedure: Coronary/Graft Acute MI Revascularization;  Surgeon: Kathleene Hazel, MD;  [facility];  Service: Cardiovascular;  Laterality: N/A;  . KNEE SURGERY  2001   left   . LEFT HEART CATH AND CORONARY ANGIOGRAPHY N/A 05/01/2017   Procedure: LEFT HEART CATH AND CORONARY ANGIOGRAPHY;  Surgeon: Kathleene Hazel, MD;  [facility];  Service: Cardiovascular;  Laterality: N/A;  . TONSILLECTOMY AND ADENOIDECTOMY  1970    Current Medications: Current Meds  Medication Sig  . aspirin 81 MG chewable tablet Chew 1 tablet (81 mg total) by mouth daily.  Marland Kitchen atorvastatin (LIPITOR) 80 MG tablet Take 1 tablet (80 mg total) by mouth daily. Please keep upcoming appt in July for future refills. Thank you  . B Complex Vitamins (VITAMIN B COMPLEX) TABS Take 1 tablet by mouth daily.  . clopidogrel (PLAVIX) 75 MG tablet Take 1 tablet (75 mg total) by mouth daily.  . metoprolol tartrate (LOPRESSOR) 25 MG tablet Take 1 tablet (25 mg  total) by mouth 2 (two) times daily. Please keep upcoming appt in July for future refills. Thank you  . nitroGLYCERIN (NITROSTAT) 0.4 MG SL tablet Place 1 tablet (0.4 mg total) under the tongue every 5 (five) minutes as needed.  . timolol (TIMOPTIC) 0.5 % ophthalmic solution Place 1 drop into both eyes 2 (two) times daily.   . [DISCONTINUED] folic acid (FOLVITE) 1 MG tablet Take 1 tablet (1 mg  total) by mouth daily.     Allergies:   Nitrofurantoin monohyd macro, Other, Sulfa antibiotics, and Sulfamethoxazole   Social History   Socioeconomic History  . Marital status: Legally Separated    Spouse name: Not on file  . Number of children: 4  . Years of education: Not on file  . Highest education level: Not on file  Occupational History  . Occupation: Retired  Tobacco Use  . Smoking status: Former Games developer  . Smokeless tobacco: Never Used  Vaping Use  . Vaping Use: Never used  Substance and Sexual Activity  . Alcohol use: No  . Drug use: No  . Sexual activity: Not on file  Other Topics Concern  . Not on file  Social History Narrative   2 caffeine drinks daily   Social Determinants of Health   Financial Resource Strain:   . Difficulty of Paying Living Expenses:   Food Insecurity:   . Worried About Programme researcher, broadcasting/film/video in the Last Year:   . Barista in the Last Year:   Transportation Needs:   . Freight forwarder (Medical):   Marland Kitchen Lack of Transportation (Non-Medical):   Physical Activity:   . Days of Exercise per Week:   . Minutes of Exercise per Session:   Stress:   . Feeling of Stress :   Social Connections:   . Frequency of Communication with Friends and Family:   . Frequency of Social Gatherings with Friends and Family:   . Attends Religious Services:   . Active Member of Clubs or Organizations:   . Attends Banker Meetings:   Marland Kitchen Marital Status:      Family History:  The patient's   family history includes Kidney disease in her mother.   ROS:   Please see the history of present illness.    ROS All other systems reviewed and are negative.   PHYSICAL EXAM:   VS:  BP 138/88   Pulse 66   Ht 5\' 10"  (1.778 m)   Wt 187 lb (84.8 kg)   SpO2 98%   BMI 26.83 kg/m   Physical Exam  GEN: Well nourished, well developed, in no acute distress  Neck: no JVD, carotid bruits, or masses Cardiac:RRR; no murmurs, rubs, or gallops  Respiratory:   clear to auscultation bilaterally, normal work of breathing GI: soft, nontender, nondistended, + BS Ext: without cyanosis, clubbing, or edema, Good distal pulses bilaterally Neuro:  Alert and Oriented x 3 Psych: euthymic mood, full affect  Wt Readings from Last 3 Encounters:  11/02/19 187 lb (84.8 kg)  11/17/18 185 lb 9.6 oz (84.2 kg)  11/02/18 186 lb 12.8 oz (84.7 kg)      Studies/Labs Reviewed:   EKG:  EKG is  ordered today.  The ekg ordered today demonstrates NSR with previous inf MI inf/lat ST changes. No acute change.  Recent Labs: 11/17/2018: ALT 14 01/02/2019: BUN 15; Creatinine, Ser 1.09; Hemoglobin 10.4; Platelets 334; Potassium 4.5; Sodium 138   Lipid Panel    Component Value Date/Time  CHOL 107 06/30/2017 0840   TRIG 66 06/30/2017 0840   HDL 40 06/30/2017 0840   CHOLHDL 2.7 06/30/2017 0840   CHOLHDL 4.6 05/01/2017 0637   VLDL 20 05/01/2017 0637   LDLCALC 54 06/30/2017 0840    Additional studies/ records that were reviewed today include:  Echo January 2019: Left ventricle: Inferobasal hypokinesis The cavity size was   normal. Wall thickness was increased in a pattern of moderate   LVH. Systolic function was normal. The estimated ejection   fraction was 55%. Wall motion was normal; there were no regional   wall motion abnormalities. Doppler parameters are consistent with   abnormal left ventricular relaxation (grade 1 diastolic   dysfunction). - Mitral valve: There was mild regurgitation. - Left atrium: The atrium was mildly dilated. - Atrial septum: No defect or patent foramen ovale was identified. - Pulmonary arteries: PA peak pressure: 33 mm Hg (S).       ASSESSMENT:    1. Coronary artery disease involving native coronary artery of native heart without angina pectoris   2. Essential hypertension   3. Hyperlipidemia, unspecified hyperlipidemia type      PLAN:  In order of problems listed above:  CAD status post inferior STEMI 04/2017 secondary to  thrombotic occlusion of the mid RCA treated with balloon angioplasty and thrombectomy, no stent placed.  Mild disease in the LAD and circumflex.  Brilinta changed to Plavix last year. No bleeding problems. Recent bmet and cbc stable.  Essential hypertension controlled on metoprolol  Hyperlipidemia LDL 85 10/12/19-add zetia for goal of LDL < 70-recheck FLP and LFT's in 3 months.    Medication Adjustments/Labs and Tests Ordered: Current medicines are reviewed at length with the patient today.  Concerns regarding medicines are outlined above.  Medication changes, Labs and Tests ordered today are listed in the Patient Instructions below. Patient Instructions  Medication Instructions:  Your physician has recommended you make the following change in your medication:   START taking Zetia 10mg  daily  *If you need a refill on your cardiac medications before your next appointment, please call your pharmacy*   Lab Work: LFT, Lipid in 3 months Your physician recommends that you return for a FASTING lipid profile:   If you have labs (blood work) drawn today and your tests are completely normal, you will receive your results only by: MyChart Message (if you have MyChart) OR . A paper copy in the mail If you have any lab test that is abnormal or we need to change your treatment, we will call you to review the results.   Testing/Procedures: None   Follow-Up: At [facility], you and your health needs are our priority.  As part of our continuing mission to provide you with exceptional heart care, we have created designated Provider Care Teams.  These Care Teams include your primary Cardiologist (physician) and Advanced Practice Providers (APPs -  Physician Assistants and Nurse Practitioners) who all work together to provide you with the care you need, when you need it.  We recommend signing up for the patient portal called "MyChart".  Sign up information is provided on this After Visit Summary.   MyChart is used to connect with patients for Virtual Visits (Telemedicine).  Patients are able to view lab/test results, encounter notes, upcoming appointments, etc.  Non-urgent messages can be sent to your provider as well.   To learn more about what you can do with MyChart, go to [facility].    Your next appointment:  12 month(s)  The format for your next appointment:   In Person  Provider:   You may see Verne Carrow, MD or one of the following Advanced Practice Providers on your designated Care Team:    Ronie Spies, PA-C  Jacolyn Reedy, PA-C    Other Instructions Your provider recommends that you maintain 150 minutes per week of moderate aerobic activity.     Elson Clan, PA-C  11/02/2019 8:58 AM    [facility] [address] [place], [place], [place]  [address] Phone: [phone]; Fax: [phone]

## 2019-11-02 ENCOUNTER — Other Ambulatory Visit: Payer: Self-pay

## 2019-11-02 ENCOUNTER — Encounter: Payer: Self-pay | Admitting: Physician Assistant

## 2019-11-02 ENCOUNTER — Ambulatory Visit: Payer: Medicare Other | Admitting: Physician Assistant

## 2019-11-02 VITALS — BP 138/88 | HR 66 | Ht 70.0 in | Wt 187.0 lb

## 2019-11-02 DIAGNOSIS — E785 Hyperlipidemia, unspecified: Secondary | ICD-10-CM | POA: Diagnosis not present

## 2019-11-02 DIAGNOSIS — I2510 Atherosclerotic heart disease of native coronary artery without angina pectoris: Secondary | ICD-10-CM

## 2019-11-02 DIAGNOSIS — I10 Essential (primary) hypertension: Secondary | ICD-10-CM | POA: Diagnosis not present

## 2019-11-02 MED ORDER — EZETIMIBE 10 MG PO TABS: 10.0000 mg | ORAL_TABLET | Freq: Every day | ORAL | 3 refills | Status: DC

## 2019-11-02 NOTE — Patient Instructions (Signed)
Medication Instructions:  Your physician has recommended you make the following change in your medication:   START taking Zetia 10mg  daily  *If you need a refill on your cardiac medications before your next appointment, please call your pharmacy*   Lab Work: LFT, Lipid in 3 months Your physician recommends that you return for a FASTING lipid profile:   If you have labs (blood work) drawn today and your tests are completely normal, you will receive your results only by: MyChart Message (if you have MyChart) OR . A paper copy in the mail If you have any lab test that is abnormal or we need to change your treatment, we will call you to review the results.   Testing/Procedures: None   Follow-Up: At [facility], you and your health needs are our priority.  As part of our continuing mission to provide you with exceptional heart care, we have created designated Provider Care Teams.  These Care Teams include your primary Cardiologist (physician) and Advanced Practice Providers (APPs -  Physician Assistants and Nurse Practitioners) who all work together to provide you with the care you need, when you need it.  We recommend signing up for the patient portal called "MyChart".  Sign up information is provided on this After Visit Summary.  MyChart is used to connect with patients for Virtual Visits (Telemedicine).  Patients are able to view lab/test results, encounter notes, upcoming appointments, etc.  Non-urgent messages can be sent to your provider as well.   To learn more about what you can do with MyChart, go to [facility].    Your next appointment:   12 month(s)  The format for your next appointment:   In Person  Provider:   You may see [email or web address], MD or one of the following Advanced Practice Providers on your designated Care Team:    Verne Carrow, PA-C  Ronie Spies, PA-C    Other Instructions Your provider recommends that you maintain 150 minutes per  week of moderate aerobic activity.

## 2020-01-14 ENCOUNTER — Ambulatory Visit: Payer: Self-pay

## 2020-01-14 ENCOUNTER — Encounter: Payer: Self-pay | Admitting: Orthopaedic Surgery

## 2020-01-14 ENCOUNTER — Other Ambulatory Visit: Payer: Self-pay | Admitting: Cardiovascular Disease

## 2020-01-14 ENCOUNTER — Ambulatory Visit (INDEPENDENT_AMBULATORY_CARE_PROVIDER_SITE_OTHER): Payer: Medicare Other | Admitting: Orthopaedic Surgery

## 2020-01-14 DIAGNOSIS — M25561 Pain in right knee: Secondary | ICD-10-CM

## 2020-01-14 DIAGNOSIS — M1611 Unilateral primary osteoarthritis, right hip: Secondary | ICD-10-CM | POA: Diagnosis not present

## 2020-01-14 DIAGNOSIS — M1711 Unilateral primary osteoarthritis, right knee: Secondary | ICD-10-CM | POA: Diagnosis not present

## 2020-01-14 DIAGNOSIS — M1712 Unilateral primary osteoarthritis, left knee: Secondary | ICD-10-CM | POA: Diagnosis not present

## 2020-01-14 DIAGNOSIS — G8929 Other chronic pain: Secondary | ICD-10-CM | POA: Diagnosis not present

## 2020-01-14 DIAGNOSIS — M25562 Pain in left knee: Secondary | ICD-10-CM

## 2020-01-14 DIAGNOSIS — M25551 Pain in right hip: Secondary | ICD-10-CM | POA: Diagnosis not present

## 2020-01-14 MED ORDER — BUPIVACAINE HCL 0.5 % IJ SOLN
2.0000 mL | INTRAMUSCULAR | Status: AC | PRN
  Administered 2020-01-14: 2 mL via INTRA_ARTICULAR

## 2020-01-14 MED ORDER — METHYLPREDNISOLONE ACETATE 40 MG/ML IJ SUSP
40.0000 mg | INTRAMUSCULAR | Status: AC | PRN
  Administered 2020-01-14: 40 mg via INTRA_ARTICULAR

## 2020-01-14 MED ORDER — LIDOCAINE HCL 1 % IJ SOLN
2.0000 mL | INTRAMUSCULAR | Status: AC | PRN
  Administered 2020-01-14: 2 mL

## 2020-01-14 NOTE — Progress Notes (Signed)
Office Visit Note   Patient: Jenna Thompson           Date of Birth: December 23, 1945           MRN: [id number] Visit Date: 01/14/2020              Requested by: Deatra James, MD [phone] [address] [place],  [place] [address] PCP: Deatra James, MD   Assessment & Plan: Visit Diagnoses:  1. Primary osteoarthritis of right knee   2. Primary osteoarthritis of right hip   3. Primary osteoarthritis of left knee     Plan: Impression is end-stage bilateral knee DJD worse on the left with valgus deformity.  She has a slight flexion contracture of the left knee as well.  Right hip demonstrates mild osteoarthritis.  Based on discussion of treatment she would like to try cortisone injections today.  We had a brief discussion on left knee replacement as a more definitive solution.  Follow-Up Instructions: Return if symptoms worsen or fail to improve.   Orders:  Orders Placed This Encounter  Procedures  . XR KNEE 3 VIEW LEFT  . XR KNEE 3 VIEW RIGHT  . XR HIP UNILAT W OR W/O PELVIS 2-3 VIEWS RIGHT  . US Guided Needle Placement - No Linked Charges   No orders of the defined types were placed in this encounter.     Procedures: Large Joint Inj: bilateral knee on 01/14/2020 11:12 AM Indications: pain Details: 22 G needle  Arthrogram: No  Medications (Right): 2 mL lidocaine 1 %; 2 mL bupivacaine 0.5 %; 40 mg methylPREDNISolone acetate 40 MG/ML Medications (Left): 2 mL lidocaine 1 %; 2 mL bupivacaine 0.5 %; 40 mg methylPREDNISolone acetate 40 MG/ML Outcome: tolerated well, no immediate complications Patient was prepped and draped in the usual sterile fashion.       Clinical Data: No additional findings.   Subjective: Chief Complaint  Patient presents with  . Right Knee - Pain  . Left Knee - Pain  . Right Hip - Pain    Ms. Jenna Thompson is a 74 year old female who I saw over 3 years ago for left knee pain.  She has severe DJD of both knees worse on the left knee with a valgus  deformity.  She is also been having right hip and groin pain as well.  Denies any numbness and tingling.  Denies any injuries.  Currently on Plavix for history of CAD and cardiac stent placement.   Review of Systems  Constitutional: Negative.   HENT: Negative.   Eyes: Negative.   Respiratory: Negative.   Cardiovascular: Negative.   Endocrine: Negative.   Musculoskeletal: Negative.   Neurological: Negative.   Hematological: Negative.   Psychiatric/Behavioral: Negative.   All other systems reviewed and are negative.    Objective: Vital Signs: There were no vitals taken for this visit.  Physical Exam Vitals and nursing note reviewed.  Constitutional:      Appearance: She is well-developed.  HENT:     Head: Normocephalic and atraumatic.  Pulmonary:     Effort: Pulmonary effort is normal.  Abdominal:     Palpations: Abdomen is soft.  Musculoskeletal:     Cervical back: Neck supple.  Skin:    General: Skin is warm.     Capillary Refill: Capillary refill takes less than 2 seconds.  Neurological:     Mental Status: She is alert and oriented to person, place, and time.  Psychiatric:  Behavior: Behavior normal.        Thought Content: Thought content normal.        Judgment: Judgment normal.     Ortho Exam Right hip shows mild pain with internal and external rotation.  Negative Stinchfield sign.  No sciatic tension signs.  Left knee shows a fixed valgus deformity.  No joint effusion.  Pain with range of motion.  2+ crepitus.  Right knee shows trace joint effusion.  Pain and crepitus with range of motion.  Collaterals and cruciates are stable. Specialty Comments:  No specialty comments available.  Imaging: XR HIP UNILAT W OR W/O PELVIS 2-3 VIEWS RIGHT  Result Date: 01/14/2020 Mild right hip osteoarthritis.  No significant degenerative changes.  XR KNEE 3 VIEW LEFT  Result Date: 01/14/2020 Severe tricompartmental DJD with valgus deformity  XR KNEE 3 VIEW  RIGHT  Result Date: 01/14/2020 Advanced tricompartmental DJD.    PMFS History: Patient Active Problem List   Diagnosis Date Noted  . Primary osteoarthritis of right knee 01/14/2020  . Primary osteoarthritis of right hip 01/14/2020  . Primary osteoarthritis of left knee 01/14/2020  . Hyperlipidemia 05/05/2017  . S/P PTCA (percutaneous transluminal coronary angioplasty)   . Iron deficiency anemia   . Acute myocardial infarction (HCC)   . Primary osteoarthritis of both knees 12/20/2016  . Personal history of colonic polyps 12/26/2010   Past Medical History:  Diagnosis Date  . Anemia, iron deficiency   . Arthritis    L>R knee  . Atrophic gastritis without mention of hemorrhage   . CAD (coronary artery disease)    a. s/p Inferior STEMI in 04/2017 with angioplasty and thrombectomy to mid-RCA, repeat cath later that admission with aspiration thrombectomy of rPDA and residual thrombus along distal vessel too small for intervention  . Colon polyps   . Diaphragmatic hernia without mention of obstruction or gangrene   . Diverticulosis of colon (without mention of hemorrhage)   . Hiatal hernia   . Hypertension   . Unspecified hemorrhoids without mention of complication     Family History  Problem Relation Age of Onset  . Kidney disease Mother   . Colon cancer Neg Hx     Past Surgical History:  Procedure Laterality Date  . ABDOMINAL HYSTERECTOMY  1978  . CHOLECYSTECTOMY  1979  . CORONARY ANGIOGRAPHY N/A 05/02/2017   Procedure: CORONARY ANGIOGRAPHY (CATH LAB);  Surgeon: Yvonne Kendall, MD;  [facility];  Service: Cardiovascular;  Laterality: N/A;  . CORONARY BALLOON ANGIOPLASTY N/A 05/02/2017   Procedure: CORONARY BALLOON ANGIOPLASTY;  Surgeon: Yvonne Kendall, MD;  [facility]: [facility];  Service: Cardiovascular;  Laterality: N/A;  . CORONARY THROMBECTOMY N/A 05/02/2017   Procedure: Coronary Thrombectomy;  Surgeon: Yvonne Kendall, MD;  [facility];  Service: Cardiovascular;  Laterality: N/A;  . CORONARY/GRAFT ACUTE MI REVASCULARIZATION N/A 05/01/2017   Procedure: Coronary/Graft Acute MI Revascularization;  Surgeon: Kathleene Hazel, MD;  [facility];  Service: Cardiovascular;  Laterality: N/A;  . KNEE SURGERY  2001   left   . LEFT HEART CATH AND CORONARY ANGIOGRAPHY N/A 05/01/2017   Procedure: LEFT HEART CATH AND CORONARY ANGIOGRAPHY;  Surgeon: Kathleene Hazel, MD;  [facility];  Service: Cardiovascular;  Laterality: N/A;  . TONSILLECTOMY AND ADENOIDECTOMY  1970   Social History   Occupational History  . Occupation: Retired  Tobacco Use  . Smoking status: Former Games developer  . Smokeless tobacco: Never Used  Vaping  Use  . Vaping Use: Never used  Substance and Sexual Activity  . Alcohol use: No  . Drug use: No  . Sexual activity: Not on file

## 2020-01-14 NOTE — Progress Notes (Signed)
Subjective: Patient is here for ultrasound-guided intra-articular right hip injection.   Groin pain with walking.  Objective:  Pain in posterior and anterior hip with passive IR.   Procedure: Ultrasound-guided right hip injection: After sterile prep with Betadine, injected 8 cc 1% lidocaine without epinephrine and 40 mg methylprednisolone using a 22-gauge spinal needle, passing the needle through the iliofemoral ligament into the femoral head/neck junction.  Injectate seen filling joint capsule.  Good immediate relief.

## 2020-01-24 ENCOUNTER — Other Ambulatory Visit: Payer: Self-pay | Admitting: Cardiovascular Disease

## 2020-01-28 ENCOUNTER — Other Ambulatory Visit: Payer: Self-pay | Admitting: Cardiovascular Disease

## 2020-02-02 ENCOUNTER — Other Ambulatory Visit: Payer: Self-pay

## 2020-02-02 ENCOUNTER — Other Ambulatory Visit: Payer: Medicare Other | Admitting: *Deleted

## 2020-02-02 DIAGNOSIS — I2510 Atherosclerotic heart disease of native coronary artery without angina pectoris: Secondary | ICD-10-CM

## 2020-02-02 DIAGNOSIS — E785 Hyperlipidemia, unspecified: Secondary | ICD-10-CM

## 2020-02-02 DIAGNOSIS — I10 Essential (primary) hypertension: Secondary | ICD-10-CM

## 2020-02-02 LAB — LIPID PANEL
Chol/HDL Ratio: 3.3 ratio (ref 0.0–4.4)
Cholesterol, Total: 129 mg/dL (ref 100–199)
HDL: 39 mg/dL — ABNORMAL LOW (ref >39)
LDL Chol Calc (NIH): 75 mg/dL (ref 0–99)
Triglycerides: 73 mg/dL (ref 0–149)
VLDL Cholesterol Cal: 15 mg/dL (ref 5–40)

## 2020-02-02 LAB — HEPATIC FUNCTION PANEL
ALT: 17 IU/L (ref 0–32)
AST: 18 IU/L (ref 0–40)
Albumin: 4.6 g/dL (ref 3.7–4.7)
Alkaline Phosphatase: 126 IU/L — ABNORMAL HIGH (ref 44–121)
Bilirubin Total: 0.6 mg/dL (ref 0.0–1.2)
Bilirubin, Direct: 0.17 mg/dL (ref 0.00–0.40)
Total Protein: 6.9 g/dL (ref 6.0–8.5)

## 2020-02-03 ENCOUNTER — Telehealth: Payer: Self-pay | Admitting: Cardiovascular Disease

## 2020-02-03 NOTE — Telephone Encounter (Signed)
Pt called in returning call about her lab results from 02/02/2020.  Best number [phone]

## 2020-02-03 NOTE — Telephone Encounter (Signed)
Returned call to pt.  Advised her cholesterol was looking good, her LDL was almost to goal.  Pt states she has been "eating boxes of Cheerios".  Advised to continue current medication regimen.

## 2020-05-08 DIAGNOSIS — H401132 Primary open-angle glaucoma, bilateral, moderate stage: Secondary | ICD-10-CM | POA: Diagnosis not present

## 2020-05-08 DIAGNOSIS — H2513 Age-related nuclear cataract, bilateral: Secondary | ICD-10-CM | POA: Diagnosis not present

## 2020-08-07 DIAGNOSIS — H401132 Primary open-angle glaucoma, bilateral, moderate stage: Secondary | ICD-10-CM | POA: Diagnosis not present

## 2020-10-13 NOTE — Progress Notes (Signed)
Cardiology Office Note    Date:  10/24/2020   ID:  Jenna, Thompson 1945-11-11, MRN [id number]   PCP:  Deatra James, MD   [facility]  Cardiologist:  Verne Carrow, MD   Advanced Practice Provider:  No care team member to display Electrophysiologist:  None   [phone]   Chief Complaint  Patient presents with   Follow-up     History of Present Illness:  Jenna Thompson is a 75 y.o. female with history of CAD status post acute inferior STEMI 05/01/2017 treated with balloon angioplasty and thrombectomy to the mid distal RCA.  No stent was placed.  Repeat cath 05/02/2017 at which time the PDA was treated with aspiration  thrombectomy.  Echo 04/04/2018 LVEF 55% with inferior wall hypokinesis also has history of hypertension, prior DVT, and anemia.   Patient last saw Dr. Clifton James 11/02/2018 and was doing well.  Brilinta changed to Plavix. Patient went to ED 01/02/2019 with chest pain felt to be musculoskeletal and stress related.  I last saw the patient 10/2019 and added Zetia for LDL 85. F/u LDL 75.  Patient comes in for yearly f/u. Sometimes short of breath going up stairs but has been going on for many years. Denies chest pain. No regular exercise because she needs a knee replacement. Not schedule and currently getting injections. No bleeding problems. Drives her grandchildren around.      Past Medical History:  Diagnosis Date   Anemia, iron deficiency    Arthritis    L>R knee   Atrophic gastritis without mention of hemorrhage    CAD (coronary artery disease)    a. s/p Inferior STEMI in 04/2017 with angioplasty and thrombectomy to mid-RCA, repeat cath later that admission with aspiration thrombectomy of rPDA and residual thrombus along distal vessel too small for intervention   Colon polyps    Diaphragmatic hernia without mention of obstruction or gangrene    Diverticulosis of colon (without mention of hemorrhage)    Hiatal hernia    Hypertension     Unspecified hemorrhoids without mention of complication     Past Surgical History:  Procedure Laterality Date   ABDOMINAL HYSTERECTOMY  1978   CHOLECYSTECTOMY  1979   CORONARY ANGIOGRAPHY N/A 05/02/2017   Procedure: CORONARY ANGIOGRAPHY (CATH LAB);  Surgeon: Yvonne Kendall, MD;  [facility];  Service: Cardiovascular;  Laterality: N/A;   CORONARY BALLOON ANGIOPLASTY N/A 05/02/2017   Procedure: CORONARY BALLOON ANGIOPLASTY;  Surgeon: Yvonne Kendall, MD;  [facility];  Service: Cardiovascular;  Laterality: N/A;   CORONARY THROMBECTOMY N/A 05/02/2017   Procedure: Coronary Thrombectomy;  Surgeon: Yvonne Kendall, MD;  [facility];  Service: Cardiovascular;  Laterality: N/A;   CORONARY/GRAFT ACUTE MI REVASCULARIZATION N/A 05/01/2017   Procedure: Coronary/Graft Acute MI Revascularization;  Surgeon: Kathleene Hazel, MD;  [facility];  Service: Cardiovascular;  Laterality: N/A;   KNEE SURGERY  2001   left    LEFT HEART CATH AND CORONARY ANGIOGRAPHY N/A 05/01/2017   Procedure: LEFT HEART CATH AND CORONARY ANGIOGRAPHY;  Surgeon: Kathleene Hazel, MD;  [facility];  Service: Cardiovascular;  Laterality: N/A;   TONSILLECTOMY AND ADENOIDECTOMY  1970    Current Medications: Current Meds  Medication Sig   aspirin 81 MG chewable tablet Chew 1 tablet (81 mg total) by mouth daily.   atorvastatin (LIPITOR) 80 MG tablet TAKE 1 TABLET BY MOUTH EVERY DAY. PLEASE KEEP JULY  APPT FOR FUTURE REFILLS   B Complex Vitamins (VITAMIN B COMPLEX) TABS Take 1 tablet by mouth daily.   clopidogrel (PLAVIX) 75 MG tablet TAKE 1 TABLET BY MOUTH EVERY DAY   metoprolol tartrate (LOPRESSOR) 25 MG tablet TAKE 1 TABLET BY MOUTH TWICE A DAY PLEASE KEEP UPCOMING JULY APPT FOR FURTHER REFILLS   nitroGLYCERIN (NITROSTAT) 0.4 MG SL tablet Place 1 tablet (0.4 mg total) under the tongue every 5 (five) minutes as needed.   timolol  (TIMOPTIC) 0.5 % ophthalmic solution Place 1 drop into both eyes 2 (two) times daily.      Allergies:   Nitrofurantoin monohyd macro, Other, Sulfa antibiotics, and Sulfamethoxazole   Social History   Socioeconomic History   Marital status: Legally Separated    Spouse name: Not on file   Number of children: 4   Years of education: Not on file   Highest education level: Not on file  Occupational History   Occupation: Retired  Tobacco Use   Smoking status: Former    Pack years: 0.00   Smokeless tobacco: Never  Vaping Use   Vaping Use: Never used  Substance and Sexual Activity   Alcohol use: No   Drug use: No   Sexual activity: Not on file  Other Topics Concern   Not on file  Social History Narrative   2 caffeine drinks daily   Social Determinants of Health   Financial Resource Strain: Not on file  Food Insecurity: Not on file  Transportation Needs: Not on file  Physical Activity: Not on file  Stress: Not on file  Social Connections: Not on file     Family History:  The patient's  family history includes Kidney disease in her mother.   ROS:   Please see the history of present illness.    ROS All other systems reviewed and are negative.   PHYSICAL EXAM:   VS:  BP 138/82   Pulse 71   Ht 5\' 10"  (1.778 m)   Wt 178 lb 9.6 oz (81 kg)   SpO2 99%   BMI 25.63 kg/m   Physical Exam  GEN: Well nourished, well developed, in no acute distress  Neck: no JVD, carotid bruits, or masses Cardiac:RRR; no murmurs, rubs, or gallops  Respiratory:  clear to auscultation bilaterally, normal work of breathing GI: soft, nontender, nondistended, + BS Ext: without cyanosis, clubbing, or edema, Good distal pulses bilaterally Neuro:  Alert and Oriented x 3 Psych: euthymic mood, full affect  Wt Readings from Last 3 Encounters:  10/24/20 178 lb 9.6 oz (81 kg)  11/02/19 187 lb (84.8 kg)  11/17/18 185 lb 9.6 oz (84.2 kg)      Studies/Labs Reviewed:   EKG:  EKG is ordered today.   The ekg ordered today demonstrates normal sinus rhythm with inferior MI, inferior lateral ST changes no acute change, inferolateral ST changes, no change from prior tracings.  Recent Labs: 02/02/2020: ALT 17   Lipid Panel    Component Value Date/Time   CHOL 129 02/02/2020 0920   TRIG 73 02/02/2020 0920   HDL 39 (L) 02/02/2020 0920   CHOLHDL 3.3 02/02/2020 0920   CHOLHDL 4.6 05/01/2017 0637   VLDL 20 05/01/2017 0637   LDLCALC 75 02/02/2020 0920    Additional studies/ records that were reviewed today include:  Echo January 2019: Left ventricle: Inferobasal hypokinesis The cavity size was   normal. Wall thickness was increased in a pattern of moderate   LVH. Systolic function was normal. The estimated  ejection   fraction was 55%. Wall motion was normal; there were no regional   wall motion abnormalities. Doppler parameters are consistent with   abnormal left ventricular relaxation (grade 1 diastolic   dysfunction). - Mitral valve: There was mild regurgitation. - Left atrium: The atrium was mildly dilated. - Atrial septum: No defect or patent foramen ovale was identified. - Pulmonary arteries: PA peak pressure: 33 mm Hg (S).           Risk Assessment/Calculations:         ASSESSMENT:    1. Coronary artery disease involving native coronary artery of native heart without angina pectoris   2. Essential hypertension   3. Mixed hyperlipidemia      PLAN:  In order of problems listed above:   CAD status post inferior STEMI 04/2017 secondary to thrombotic occlusion of the mid RCA treated with balloon angioplasty and thrombectomy, no stent placed.  Mild disease in the LAD and circumflex.  Brilinta changed to Plavix last year. No bleeding problems.  No angina.  Recommend 150 minutes of exercise weekly.  She is limited with left knee trouble.   Essential hypertension controlled on metoprolol   Hyperlipidemia LDL 75 02/02/20 on Lipitor.  To have the blood work done by PCP in  September.     Shared Decision Making/Informed Consent        Medication Adjustments/Labs and Tests Ordered: Current medicines are reviewed at length with the patient today.  Concerns regarding medicines are outlined above.  Medication changes, Labs and Tests ordered today are listed in the Patient Instructions below. Patient Instructions  Medication Instructions:  Your physician recommends that you continue on your current medications as directed. Please refer to the Current Medication list given to you today.  *If you need a refill on your cardiac medications before your next appointment, please call your pharmacy*   Lab Work: none If you have labs (blood work) drawn today and your tests are completely normal, you will receive your results only by: MyChart Message (if you have MyChart) OR A paper copy in the mail If you have any lab test that is abnormal or we need to change your treatment, we will call you to review the results.   Follow-Up: At [facility], you and your health needs are our priority.  As part of our continuing mission to provide you with exceptional heart care, we have created designated Provider Care Teams.  These Care Teams include your primary Cardiologist (physician) and Advanced Practice Providers (APPs -  Physician Assistants and Nurse Practitioners) who all work together to provide you with the care you need, when you need it.  We recommend signing up for the patient portal called "MyChart".  Sign up information is provided on this After Visit Summary.  MyChart is used to connect with patients for Virtual Visits (Telemedicine).  Patients are able to view lab/test results, encounter notes, upcoming appointments, etc.  Non-urgent messages can be sent to your provider as well.   To learn more about what you can do with MyChart, go to [email or web address].    Your next appointment:   1 year(s)  The format for your next appointment:   In  Person  Provider:   You may see Verne Carrow, MD or one of the following Advanced Practice Providers on your designated Care Team:   Ronie Spies, PA-C Jacolyn Reedy, PA-C   Other Instructions Your provider recommends that you maintain 150 minutes per week of moderate aerobic activity.  Elson Clan, PA-C  10/24/2020 11:32 AM    [facility] [address] [place], [place], [place]  [address] Phone: [phone]; Fax: [phone]

## 2020-10-24 ENCOUNTER — Encounter: Payer: Self-pay | Admitting: Physician Assistant

## 2020-10-24 ENCOUNTER — Other Ambulatory Visit: Payer: Self-pay

## 2020-10-24 ENCOUNTER — Ambulatory Visit: Payer: Medicare Other | Admitting: Physician Assistant

## 2020-10-24 VITALS — BP 138/82 | HR 71 | Ht 70.0 in | Wt 178.6 lb

## 2020-10-24 DIAGNOSIS — E782 Mixed hyperlipidemia: Secondary | ICD-10-CM | POA: Diagnosis not present

## 2020-10-24 DIAGNOSIS — I2510 Atherosclerotic heart disease of native coronary artery without angina pectoris: Secondary | ICD-10-CM | POA: Diagnosis not present

## 2020-10-24 DIAGNOSIS — I10 Essential (primary) hypertension: Secondary | ICD-10-CM | POA: Diagnosis not present

## 2020-10-24 NOTE — Patient Instructions (Signed)
Medication Instructions:  Your physician recommends that you continue on your current medications as directed. Please refer to the Current Medication list given to you today.  *If you need a refill on your cardiac medications before your next appointment, please call your pharmacy*   Lab Work: none If you have labs (blood work) drawn today and your tests are completely normal, you will receive your results only by: MyChart Message (if you have MyChart) OR A paper copy in the mail If you have any lab test that is abnormal or we need to change your treatment, we will call you to review the results.   Follow-Up: At [facility], you and your health needs are our priority.  As part of our continuing mission to provide you with exceptional heart care, we have created designated Provider Care Teams.  These Care Teams include your primary Cardiologist (physician) and Advanced Practice Providers (APPs -  Physician Assistants and Nurse Practitioners) who all work together to provide you with the care you need, when you need it.  We recommend signing up for the patient portal called "MyChart".  Sign up information is provided on this After Visit Summary.  MyChart is used to connect with patients for Virtual Visits (Telemedicine).  Patients are able to view lab/test results, encounter notes, upcoming appointments, etc.  Non-urgent messages can be sent to your provider as well.   To learn more about what you can do with MyChart, go to [email or web address].    Your next appointment:   1 year(s)  The format for your next appointment:   In Person  Provider:   You may see Verne Carrow, MD or one of the following Advanced Practice Providers on your designated Care Team:   Ronie Spies, PA-C Jacolyn Reedy, PA-C   Other Instructions Your provider recommends that you maintain 150 minutes per week of moderate aerobic activity.

## 2020-11-27 ENCOUNTER — Other Ambulatory Visit: Payer: Self-pay | Admitting: *Deleted

## 2020-11-27 MED ORDER — NITROGLYCERIN 0.4 MG SL SUBL: 0.4000 mg | SUBLINGUAL_TABLET | SUBLINGUAL | 2 refills | Status: DC | PRN

## 2020-11-29 ENCOUNTER — Ambulatory Visit: Payer: Medicare Other | Attending: Internal Medicine

## 2020-11-29 ENCOUNTER — Other Ambulatory Visit (HOSPITAL_BASED_OUTPATIENT_CLINIC_OR_DEPARTMENT_OTHER): Payer: Self-pay

## 2020-11-29 DIAGNOSIS — Z23 Encounter for immunization: Secondary | ICD-10-CM

## 2020-11-29 MED ORDER — PFIZER-BIONT COVID-19 VAC-TRIS 30 MCG/0.3ML IM SUSP
INTRAMUSCULAR | 0 refills | Status: DC
  Filled 2020-11-29: qty 0.3, 1d supply, fill #0

## 2020-11-29 NOTE — Progress Notes (Signed)
   Covid-19 Vaccination Clinic  Name:  Jenna Thompson    MRN: [id number] DOB: Jun 25, 1945  11/29/2020  [name] was observed post Covid-19 immunization for 15 minutes without incident. She was provided with Vaccine Information Sheet and instruction to access the V-Safe system.   [name] was instructed to call 911 with any severe reactions post vaccine: Difficulty breathing  Swelling of face and throat  A fast heartbeat  A bad rash all over body  Dizziness and weakness   Immunizations Administered     Name Date Dose VIS Date Route   PFIZER Comrnaty(Gray TOP) Covid-19 Vaccine 11/29/2020  3:26 PM 0.3 mL 03/23/2020 Intramuscular   Manufacturer: [employer], [employer]   Lot: [id number]   NDC: [phone]

## 2021-01-05 DIAGNOSIS — Z23 Encounter for immunization: Secondary | ICD-10-CM | POA: Diagnosis not present

## 2021-01-05 DIAGNOSIS — Z1231 Encounter for screening mammogram for malignant neoplasm of breast: Secondary | ICD-10-CM | POA: Diagnosis not present

## 2021-01-05 DIAGNOSIS — Z1389 Encounter for screening for other disorder: Secondary | ICD-10-CM | POA: Diagnosis not present

## 2021-01-05 DIAGNOSIS — I2510 Atherosclerotic heart disease of native coronary artery without angina pectoris: Secondary | ICD-10-CM | POA: Diagnosis not present

## 2021-01-05 DIAGNOSIS — D649 Anemia, unspecified: Secondary | ICD-10-CM | POA: Diagnosis not present

## 2021-01-05 DIAGNOSIS — Z0000 Encounter for general adult medical examination without abnormal findings: Secondary | ICD-10-CM | POA: Diagnosis not present

## 2021-01-05 DIAGNOSIS — I10 Essential (primary) hypertension: Secondary | ICD-10-CM | POA: Diagnosis not present

## 2021-01-08 ENCOUNTER — Other Ambulatory Visit: Payer: Self-pay | Admitting: Family Medicine

## 2021-01-08 DIAGNOSIS — E2839 Other primary ovarian failure: Secondary | ICD-10-CM

## 2021-01-08 DIAGNOSIS — E7800 Pure hypercholesterolemia, unspecified: Secondary | ICD-10-CM | POA: Diagnosis not present

## 2021-01-08 DIAGNOSIS — Z1231 Encounter for screening mammogram for malignant neoplasm of breast: Secondary | ICD-10-CM

## 2021-01-26 ENCOUNTER — Other Ambulatory Visit: Payer: Self-pay | Admitting: Cardiovascular Disease

## 2021-02-09 ENCOUNTER — Other Ambulatory Visit: Payer: Self-pay

## 2021-02-09 ENCOUNTER — Ambulatory Visit
Admission: RE | Admit: 2021-02-09 | Discharge: 2021-02-09 | Disposition: A | Discharge status: Home / Self Care | Payer: Medicare Other | Source: Ambulatory Visit | Attending: Family Medicine | Admitting: Family Medicine

## 2021-02-09 DIAGNOSIS — E2839 Other primary ovarian failure: Secondary | ICD-10-CM

## 2021-02-09 DIAGNOSIS — Z780 Asymptomatic menopausal state: Secondary | ICD-10-CM | POA: Diagnosis not present

## 2021-03-14 ENCOUNTER — Ambulatory Visit: Payer: Medicare Other

## 2021-04-11 ENCOUNTER — Ambulatory Visit
Admission: RE | Admit: 2021-04-11 | Discharge: 2021-04-11 | Disposition: A | Discharge status: Home / Self Care | Payer: Medicare Other | Source: Ambulatory Visit | Attending: Family Medicine | Admitting: Family Medicine

## 2021-04-11 DIAGNOSIS — Z1231 Encounter for screening mammogram for malignant neoplasm of breast: Secondary | ICD-10-CM | POA: Diagnosis not present

## 2021-04-13 ENCOUNTER — Other Ambulatory Visit: Payer: Self-pay | Admitting: Family Medicine

## 2021-04-13 DIAGNOSIS — R928 Other abnormal and inconclusive findings on diagnostic imaging of breast: Secondary | ICD-10-CM

## 2021-04-19 ENCOUNTER — Ambulatory Visit
Admission: RE | Admit: 2021-04-19 | Discharge: 2021-04-19 | Disposition: A | Discharge status: Home / Self Care | Payer: Medicare Other | Source: Ambulatory Visit | Attending: Family Medicine | Admitting: Family Medicine

## 2021-04-19 ENCOUNTER — Other Ambulatory Visit: Payer: Self-pay | Admitting: Family Medicine

## 2021-04-19 DIAGNOSIS — N6489 Other specified disorders of breast: Secondary | ICD-10-CM

## 2021-04-19 DIAGNOSIS — R928 Other abnormal and inconclusive findings on diagnostic imaging of breast: Secondary | ICD-10-CM

## 2021-04-19 DIAGNOSIS — R922 Inconclusive mammogram: Secondary | ICD-10-CM | POA: Diagnosis not present

## 2021-05-17 DIAGNOSIS — H401132 Primary open-angle glaucoma, bilateral, moderate stage: Secondary | ICD-10-CM | POA: Diagnosis not present

## 2021-05-17 DIAGNOSIS — H2513 Age-related nuclear cataract, bilateral: Secondary | ICD-10-CM | POA: Diagnosis not present

## 2021-10-01 ENCOUNTER — Other Ambulatory Visit: Payer: Self-pay | Admitting: Cardiovascular Disease

## 2021-11-13 ENCOUNTER — Ambulatory Visit
Admission: RE | Admit: 2021-11-13 | Discharge: 2021-11-13 | Disposition: A | Discharge status: Home / Self Care | Payer: Medicare Other | Source: Ambulatory Visit | Attending: Family Medicine | Admitting: Family Medicine

## 2021-11-13 ENCOUNTER — Other Ambulatory Visit: Payer: Self-pay | Admitting: Family Medicine

## 2021-11-13 DIAGNOSIS — N6489 Other specified disorders of breast: Secondary | ICD-10-CM | POA: Diagnosis not present

## 2021-11-19 ENCOUNTER — Other Ambulatory Visit: Payer: Self-pay | Admitting: Cardiovascular Disease

## 2021-11-20 ENCOUNTER — Ambulatory Visit (INDEPENDENT_AMBULATORY_CARE_PROVIDER_SITE_OTHER): Payer: Medicare Other

## 2021-11-20 ENCOUNTER — Ambulatory Visit: Payer: Self-pay

## 2021-11-20 ENCOUNTER — Ambulatory Visit: Payer: Medicare Other | Admitting: Orthopaedic Surgery

## 2021-11-20 DIAGNOSIS — M1711 Unilateral primary osteoarthritis, right knee: Secondary | ICD-10-CM

## 2021-11-20 DIAGNOSIS — M1712 Unilateral primary osteoarthritis, left knee: Secondary | ICD-10-CM

## 2021-11-20 NOTE — Progress Notes (Signed)
Office Visit Note   Patient: Jenna Thompson           Date of Birth: Apr 29, 1945           MRN: [id number] Visit Date: 11/20/2021              Requested by: Deatra James, MD [phone] [address] [place],  [place] [address] PCP: Deatra James, MD   Assessment & Plan: Visit Diagnoses:  1. Primary osteoarthritis of right knee   2. Primary osteoarthritis of left knee     Plan: Impression is advanced bilateral knee DJD.  Significant valgus deformity on the left side.  Based on her options and the fact that conservative management is no longer effective she has elected to move forward with a left total knee replacement.  Risk benefits prognosis reviewed.  We will get preoperative cardiac clearance from Dr. Clifton James.  She will need to stop the Plavix and we can advance for surgery.  Questions encouraged and answered.  Follow-Up Instructions: No follow-ups on file.   Orders:  Orders Placed This Encounter  Procedures   XR KNEE 3 VIEW RIGHT   XR KNEE 3 VIEW LEFT   No orders of the defined types were placed in this encounter.     Procedures: No procedures performed   Clinical Data: No additional findings.   Subjective: Chief Complaint  Patient presents with   Right Knee - Pain   Left Knee - Pain    HPI Ms. Jenna Thompson returns today for follow-up of bilateral knee pain.  The pain is getting worse and is constant.  Interferes with quality of life and ADLs.  Lives with daughter and granddaughters.  Takes Tylenol for pain.  Takes Plavix daily.  Has had prior cortisone injections without significant relief. Review of Systems   Objective: Vital Signs: There were no vitals taken for this visit.  Physical Exam  Ortho Exam Examination of left knee shows valgus alignment.  She has about 4 mm valgus laxity.  There is significant pain and crepitus throughout range of motion.  Lateral joint line tenderness.  Examination right knee shows a very mild valgus alignment.  Pain and  crepitus throughout range of motion.  Medial and lateral joint line tenderness. Specialty Comments:  No specialty comments available.  Imaging: XR KNEE 3 VIEW RIGHT  Result Date: 11/20/2021 Advanced tricompartmental degenerative joint disease.  Bone-on-bone joint space narrowing.  Mild valgus deformity  XR KNEE 3 VIEW LEFT  Result Date: 11/20/2021 Advanced tricompartmental degenerative joint disease.  Bone-on-bone joint space narrowing.  Valgus deformity.    PMFS History: Patient Active Problem List   Diagnosis Date Noted   Primary osteoarthritis of right knee 01/14/2020   Primary osteoarthritis of right hip 01/14/2020   Primary osteoarthritis of left knee 01/14/2020   Hyperlipidemia 05/05/2017   S/P PTCA (percutaneous transluminal coronary angioplasty)    Iron deficiency anemia    Acute myocardial infarction [facility])    Primary osteoarthritis of both knees 12/20/2016   Personal history of colonic polyps 12/26/2010   Past Medical History:  Diagnosis Date   Anemia, iron deficiency    Arthritis    L>R knee   Atrophic gastritis without mention of hemorrhage    CAD (coronary artery disease)    a. s/p Inferior STEMI in 04/2017 with angioplasty and thrombectomy to mid-RCA, repeat cath later that admission with aspiration thrombectomy of rPDA and residual thrombus along distal vessel too small for intervention   Colon polyps  Diaphragmatic hernia without mention of obstruction or gangrene    Diverticulosis of colon (without mention of hemorrhage)    Hiatal hernia    Hypertension    Unspecified hemorrhoids without mention of complication     Family History  Problem Relation Age of Onset   Kidney disease Mother    Colon cancer Neg Hx    Breast cancer Neg Hx     Past Surgical History:  Procedure Laterality Date   ABDOMINAL HYSTERECTOMY  1978   CHOLECYSTECTOMY  1979   CORONARY ANGIOGRAPHY N/A 05/02/2017   Procedure: CORONARY ANGIOGRAPHY (CATH LAB);  Surgeon: Yvonne Kendall,  MD;  [facility];  Service: Cardiovascular;  Laterality: N/A;   CORONARY BALLOON ANGIOPLASTY N/A 05/02/2017   Procedure: CORONARY BALLOON ANGIOPLASTY;  Surgeon: Yvonne Kendall, MD;  Location: [facility];  Service: Cardiovascular;  Laterality: N/A;   CORONARY THROMBECTOMY N/A 05/02/2017   Procedure: Coronary Thrombectomy;  Surgeon: Yvonne Kendall, MD;  Location: [facility];  Service: Cardiovascular;  Laterality: N/A;   CORONARY/GRAFT ACUTE MI REVASCULARIZATION N/A 05/01/2017   Procedure: Coronary/Graft Acute MI Revascularization;  Surgeon: Kathleene Hazel, MD;  [facility]: [facility];  Service: Cardiovascular;  Laterality: N/A;   KNEE SURGERY  2001   left    LEFT HEART CATH AND CORONARY ANGIOGRAPHY N/A 05/01/2017   Procedure: LEFT HEART CATH AND CORONARY ANGIOGRAPHY;  Surgeon: Kathleene Hazel, MD;  Location: [facility];  Service: Cardiovascular;  Laterality: N/A;   TONSILLECTOMY AND ADENOIDECTOMY  1970   Social History   Occupational History   Occupation: Retired  Tobacco Use   Smoking status: Former   Smokeless tobacco: Never  Building services engineer Use: Never used  Substance and Sexual Activity   Alcohol use: No   Drug use: No   Sexual activity: Not on file

## 2021-11-26 ENCOUNTER — Telehealth: Payer: Self-pay

## 2021-11-26 NOTE — Telephone Encounter (Signed)
Patient has a visit with Herma Carson, PA-C, on 11/28/2021. Pre-op evaluation can be addressed at that time. I will route clearance form to [facility] and add "pre-op eval" to appointment notes so she is aware.  Dr. Clifton James, can you please go ahead and comment on how long Aspirin and Plavix can be held for knee replacement so Jenna Thompson as this information at her visit? She has a history of acute inferior STEMI in 04/2017 which was initially treated with balloon angioplasty and thrombectomy of the mid to distal RCA. She was then taken back to the cath the following day and PDA was treated with aspiration thrombectomy. She never had any stents placed.  Please route response back to [facility] PREOP.  Thank you! Jorene Kaylor

## 2021-11-26 NOTE — Telephone Encounter (Signed)
   Pre-operative Risk Assessment    Patient Name: Jenna Thompson  DOB: February 27, 1946 MRN: [id number]      Request for Surgical Clearance    Procedure:   Left total knee arthroplasty  Date of Surgery:  Clearance TBD                                 Surgeon:  Dr. Roda Shutters Surgeon's Group or Practice Name:  [facility] Phone number:  [phone] Fax number:  [phone]   Type of Clearance Requested:   - Medical  - Pharmacy:  Hold Aspirin and Clopidogrel (Plavix) None   Type of Anesthesia:  Spinal   Additional requests/questions:   None  Signed, Yan Pankratz   11/26/2021, 4:26 PM

## 2021-11-28 ENCOUNTER — Encounter: Payer: Self-pay | Admitting: Physician Assistant

## 2021-11-28 ENCOUNTER — Ambulatory Visit: Payer: Medicare Other | Admitting: Physician Assistant

## 2021-11-28 VITALS — BP 130/80 | HR 78 | Ht 70.0 in | Wt 177.0 lb

## 2021-11-28 DIAGNOSIS — E782 Mixed hyperlipidemia: Secondary | ICD-10-CM | POA: Diagnosis not present

## 2021-11-28 DIAGNOSIS — I2510 Atherosclerotic heart disease of native coronary artery without angina pectoris: Secondary | ICD-10-CM

## 2021-11-28 DIAGNOSIS — I10 Essential (primary) hypertension: Secondary | ICD-10-CM

## 2021-11-28 DIAGNOSIS — Z01818 Encounter for other preprocedural examination: Secondary | ICD-10-CM

## 2021-11-28 DIAGNOSIS — Z79899 Other long term (current) drug therapy: Secondary | ICD-10-CM

## 2021-11-28 NOTE — Progress Notes (Addendum)
Cardiology Office Note    Date:  11/28/2021   ID:  Jenna Thompson, Jenna Thompson 06-05-1945, MRN [id number]   PCP:  Donald Prose, [place]  Cardiologist:  Lauree Chandler, MD   Advanced Practice Provider:  No care team member to display Electrophysiologist:  None   [phone]   Chief Complaint  Patient presents with   Pre-op Exam    History of Present Illness:  Jenna Thompson is a 76 y.o. female with history of CAD status post acute inferior STEMI 05/01/2017 treated with balloon angioplasty and thrombectomy to the mid distal RCA.  No stent was placed.  Repeat cath 05/02/2017 at which time the PDA was treated with aspiration  thrombectomy.  Echo 04/04/2018 LVEF 55% with inferior wall hypokinesis also has history of hypertension, prior DVT, and anemia.   Patient last saw Dr. Angelena Form 11/02/2018 and was doing well.  Brilinta changed to Plavix. Patient went to ED 01/02/2019 with chest pain felt to be musculoskeletal and stress related.  I last saw the patient 10/2020 and  doing well.  Patient on my schedule for surgical clearance for Left total knee arthoplasty in Sept by Dr. Erlinda Hong needs to hold ASA & Plavix. Patient has some needle like pain when she lays down depending on what she eats. She thinks it's indigestion. She can't exercise much with knee pain. 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 Denies chest pressure, tightness. Occasionally breaths harder when she goes up stairs and carries something but this has been ongoing for years. No symptoms like prior MI.  Past Medical History:  Diagnosis Date   Anemia, iron deficiency    Arthritis    L>R knee   Atrophic gastritis without mention of hemorrhage    CAD (coronary artery disease)    a. s/p Inferior STEMI in 04/2017 with angioplasty and thrombectomy to mid-RCA, repeat cath later that admission with aspiration thrombectomy of rPDA and residual thrombus along distal vessel too small for intervention   Colon  polyps    Diaphragmatic hernia without mention of obstruction or gangrene    Diverticulosis of colon (without mention of hemorrhage)    Hiatal hernia    Hypertension    Unspecified hemorrhoids without mention of complication     Past Surgical History:  Procedure Laterality Date   ABDOMINAL HYSTERECTOMY  1978   CHOLECYSTECTOMY  1979   CORONARY ANGIOGRAPHY N/A 05/02/2017   Procedure: CORONARY ANGIOGRAPHY (CATH LAB);  Surgeon: Nelva Bush, MD;  Location: [place] CV [facility];  Service: Cardiovascular;  Laterality: N/A;   CORONARY BALLOON ANGIOPLASTY N/A 05/02/2017   Procedure: CORONARY BALLOON ANGIOPLASTY;  Surgeon: Nelva Bush, MD;  Location: [place] CV LAB;  Service: Cardiovascular;  Laterality: N/A;   CORONARY THROMBECTOMY N/A 05/02/2017   Procedure: Coronary Thrombectomy;  Surgeon: Nelva Bush, MD;  Location: [place] CV LAB;  Service: Cardiovascular;  Laterality: N/A;   CORONARY/GRAFT ACUTE MI REVASCULARIZATION N/A 05/01/2017   Procedure: Coronary/Graft Acute MI Revascularization;  Surgeon: Burnell Blanks, MD;  Location: [place] CV LAB;  Service: Cardiovascular;  Laterality: N/A;   KNEE SURGERY  2001   left    LEFT HEART CATH AND CORONARY ANGIOGRAPHY N/A 05/01/2017   Procedure: LEFT HEART CATH AND CORONARY ANGIOGRAPHY;  Surgeon: Burnell Blanks, MD;  Location: [place] CV LAB;  Service: Cardiovascular;  Laterality: N/A;   TONSILLECTOMY AND ADENOIDECTOMY  1970    Current Medications: Current Meds  Medication Sig   aspirin  81 MG chewable tablet Chew 1 tablet (81 mg total) by mouth daily.   atorvastatin (LIPITOR) 80 MG tablet TAKE 1 TABLET BY MOUTH  DAILY   B Complex Vitamins (VITAMIN B COMPLEX) TABS Take 1 tablet by mouth daily.   clopidogrel (PLAVIX) 75 MG tablet TAKE 1 TABLET BY MOUTH  DAILY   COVID-19 mRNA Vac-TriS, Pfizer, (PFIZER-BIONT COVID-19 VAC-TRIS) SUSP injection Inject into the muscle.   metoprolol tartrate (LOPRESSOR) 25 MG tablet  TAKE 1 TABLET BY MOUTH  TWICE DAILY   nitroGLYCERIN (NITROSTAT) 0.4 MG SL tablet DISSOLVE 1 TABLET UNDER THE  TONGUE EVERY 5 MINUTES AS NEEDED FOR CHEST PAIN. MAX OF 3 TABLETS IN 15 MINUTES. CALL 911 IF PAIN  PERSISTS.   timolol (TIMOPTIC) 0.5 % ophthalmic solution Place 1 drop into both eyes 2 (two) times daily.      Allergies:   Nitrofurantoin monohyd macro, Other, Sulfa antibiotics, and Sulfamethoxazole   Social History   Socioeconomic History   Marital status: Legally Separated    Spouse name: Not on file   Number of children: 4   Years of education: Not on file   Highest education level: Not on file  Occupational History   Occupation: Retired  Tobacco Use   Smoking status: Former   Smokeless tobacco: Never  Building services engineer Use: Never used  Substance and Sexual Activity   Alcohol use: No   Drug use: No   Sexual activity: Not on file  Other Topics Concern   Not on file  Social History Narrative   2 caffeine drinks daily   Social Determinants of Health   Financial Resource Strain: Not on file  Food Insecurity: Not on file  Transportation Needs: Not on file  Physical Activity: Not on file  Stress: Not on file  Social Connections: Not on file     Family History:  The patient's  family history includes Kidney disease in her mother.   ROS:   Please see the history of present illness.    ROS All other systems reviewed and are negative.   PHYSICAL EXAM:   VS:  BP 130/80 (BP Location: Left Arm, Patient Position: Sitting, Cuff Size: Normal)   Pulse 78   Ht 5\' 10"  (1.778 m)   Wt 177 lb (80.3 kg)   BMI 25.40 kg/m   Physical Exam  GEN: Well nourished, well developed, in no acute distress  Neck: no JVD, carotid bruits, or masses Cardiac:RRR; no murmurs, rubs, or gallops  Respiratory:  clear to auscultation bilaterally, normal work of breathing GI: soft, nontender, nondistended, + BS Ext: without cyanosis, clubbing, or edema, Good distal pulses  bilaterally Neuro:  Alert and Oriented x 3, Psych: euthymic mood, full affect  Wt Readings from Last 3 Encounters:  11/28/21 177 lb (80.3 kg)  10/24/20 178 lb 9.6 oz (81 kg)  11/02/19 187 lb (84.8 kg)      Studies/Labs Reviewed:   EKG:  EKG is  ordered today.  The ekg ordered today demonstrates NSR with LVH and inf MI nonspecific ST changes, no change from prior tracings.  Recent Labs: No results found for requested labs within last 365 days.   Lipid Panel    Component Value Date/Time   CHOL 129 02/02/2020 0920   TRIG 73 02/02/2020 0920   HDL 39 (L) 02/02/2020 0920   CHOLHDL 3.3 02/02/2020 0920   CHOLHDL 4.6 05/01/2017 0637   VLDL 20 05/01/2017 0637   LDLCALC 75 02/02/2020 0920    Additional  studies/ records that were reviewed today include:    Echo January 2019: Left ventricle: Inferobasal hypokinesis The cavity size was   normal. Wall thickness was increased in a pattern of moderate   LVH. Systolic function was normal. The estimated ejection   fraction was 55%. Wall motion was normal; there were no regional   wall motion abnormalities. Doppler parameters are consistent with   abnormal left ventricular relaxation (grade 1 diastolic   dysfunction). - Mitral valve: There was mild regurgitation. - Left atrium: The atrium was mildly dilated. - Atrial septum: No defect or patent foramen ovale was identified. - Pulmonary arteries: PA peak pressure: 33 mm Hg (S).          Risk Assessment/Calculations:         ASSESSMENT:    1. Preoperative clearance   2. Coronary artery disease involving native coronary artery of native heart without angina pectoris   3. Essential hypertension   4. Mixed hyperlipidemia   5. Medication management      PLAN:  In order of problems listed above:  Preop clearance for knee arthroplasty by Dr.Xu in Sept. Patient with CAD as described below and no anginal symptoms. METs >5. No further testing indicated prior to surgery.   Dr.  Angelena Form said patient can hold Plavix and ASA for up to one week prior to surgery.  According to the Revised Cardiac Risk Index (RCRI), her Perioperative Risk of Major Cardiac Event is (%): 0.9  Her Functional Capacity in METs is: 5.07 according to the Duke Activity Status Index (DASI).   CAD status post inferior STEMI 04/2017 secondary to thrombotic occlusion of the mid RCA treated with balloon angioplasty and thrombectomy, no stent placed.  Mild disease in the LAD and circumflex.  Brilinta changed to Plavix No bleeding problems.  Check labs today   Essential hypertension controlled on metoprolol   Hyperlipidemia LDL 75 02/02/20 after zetia added.  Shared Decision Making/Informed Consent        Medication Adjustments/Labs and Tests Ordered: Current medicines are reviewed at length with the patient today.  Concerns regarding medicines are outlined above.  Medication changes, Labs and Tests ordered today are listed in the Patient Instructions below. Patient Instructions  Medication Instructions:  Your physician recommends that you continue on your current medications as directed. Please refer to the Current Medication list given to you today.  *If you need a refill on your cardiac medications before your next appointment, please call your pharmacy*   Lab Work: Cmp, Cbc, Lipids - today   If you have labs (blood work) drawn today and your tests are completely normal, you will receive your results only by: [facility] (if you have MyChart) OR A paper copy in the mail If you have any lab test that is abnormal or we need to change your treatment, we will call you to review the results.   Testing/Procedures: None ordered    Follow-Up: At [facility], you and your health needs are our priority.  As part of our continuing mission to provide you with exceptional heart care, we have created designated Provider Care Teams.  These Care Teams include your primary Cardiologist  (physician) and Advanced Practice Providers (APPs -  Physician Assistants and Nurse Practitioners) who all work together to provide you with the care you need, when you need it.  We recommend signing up for the patient portal called "MyChart".  Sign up information is provided on this After Visit Summary.  MyChart is used to  connect with patients for Virtual Visits (Telemedicine).  Patients are able to view lab/test results, encounter notes, upcoming appointments, etc.  Non-urgent messages can be sent to your provider as well.   To learn more about what you can do with MyChart, go to [email or web address].    Your next appointment:   12 month(s)  The format for your next appointment:   In Person  Provider:   Verne Carrow, MD     Other Instructions   Important Information About Sugar         Signed, Jacolyn Reedy, PA-C  11/28/2021 12:16 PM    [facility] [address] [place], [place], [place]  [address] Phone: [phone]; Fax: [phone]

## 2021-11-28 NOTE — Patient Instructions (Signed)
Medication Instructions:  Your physician recommends that you continue on your current medications as directed. Please refer to the Current Medication list given to you today.  *If you need a refill on your cardiac medications before your next appointment, please call your pharmacy*   Lab Work: Cmp, Cbc, Lipids - today   If you have labs (blood work) drawn today and your tests are completely normal, you will receive your results only by: MyChart Message (if you have MyChart) OR A paper copy in the mail If you have any lab test that is abnormal or we need to change your treatment, we will call you to review the results.   Testing/Procedures: None ordered    Follow-Up: At [facility], you and your health needs are our priority.  As part of our continuing mission to provide you with exceptional heart care, we have created designated Provider Care Teams.  These Care Teams include your primary Cardiologist (physician) and Advanced Practice Providers (APPs -  Physician Assistants and Nurse Practitioners) who all work together to provide you with the care you need, when you need it.  We recommend signing up for the patient portal called "MyChart".  Sign up information is provided on this After Visit Summary.  MyChart is used to connect with patients for Virtual Visits (Telemedicine).  Patients are able to view lab/test results, encounter notes, upcoming appointments, etc.  Non-urgent messages can be sent to your provider as well.   To learn more about what you can do with MyChart, go to [email or web address].    Your next appointment:   12 month(s)  The format for your next appointment:   In Person  Provider:   Verne Carrow, MD     Other Instructions   Important Information About Sugar

## 2021-11-29 LAB — LIPID PANEL
Chol/HDL Ratio: 3.2 ratio (ref 0.0–4.4)
Cholesterol, Total: 130 mg/dL (ref 100–199)
HDL: 41 mg/dL (ref >39)
LDL Chol Calc (NIH): 71 mg/dL (ref 0–99)
Triglycerides: 95 mg/dL (ref 0–149)
VLDL Cholesterol Cal: 18 mg/dL (ref 5–40)

## 2021-11-29 LAB — COMPREHENSIVE METABOLIC PANEL
ALT: 14 IU/L (ref 0–32)
AST: 22 IU/L (ref 0–40)
Albumin/Globulin Ratio: 1.9 (ref 1.2–2.2)
Albumin: 4.6 g/dL (ref 3.8–4.8)
Alkaline Phosphatase: 124 IU/L — ABNORMAL HIGH (ref 44–121)
BUN/Creatinine Ratio: 11 — ABNORMAL LOW (ref 12–28)
BUN: 12 mg/dL (ref 8–27)
Bilirubin Total: 0.6 mg/dL (ref 0.0–1.2)
CO2: 23 mmol/L (ref 20–29)
Calcium: 9.7 mg/dL (ref 8.7–10.3)
Chloride: 104 mmol/L (ref 96–106)
Creatinine, Ser: 1.11 mg/dL — ABNORMAL HIGH (ref 0.57–1.00)
Globulin, Total: 2.4 g/dL (ref 1.5–4.5)
Glucose: 98 mg/dL (ref 70–99)
Potassium: 4.4 mmol/L (ref 3.5–5.2)
Sodium: 140 mmol/L (ref 134–144)
Total Protein: 7 g/dL (ref 6.0–8.5)
eGFR: 52 mL/min/{1.73_m2} — ABNORMAL LOW (ref >59)

## 2021-11-29 LAB — CBC
Hematocrit: 31.2 % — ABNORMAL LOW (ref 34.0–46.6)
Hemoglobin: 9.3 g/dL — ABNORMAL LOW (ref 11.1–15.9)
MCH: 21.6 pg — ABNORMAL LOW (ref 26.6–33.0)
MCHC: 29.8 g/dL — ABNORMAL LOW (ref 31.5–35.7)
MCV: 72 fL — ABNORMAL LOW (ref 79–97)
NRBC: 8 % — ABNORMAL HIGH (ref 0–0)
Platelets: 283 10*3/uL (ref 150–450)
RBC: 4.31 x10E6/uL (ref 3.77–5.28)
RDW: 22.1 % — ABNORMAL HIGH (ref 11.7–15.4)
WBC: 5.1 10*3/uL (ref 3.4–10.8)

## 2021-12-03 NOTE — Telephone Encounter (Signed)
Covering preop today. Will route Dr. Gibson Ramp reply to Jacolyn Reedy so that she can update/addend her recent OV to include this recommendation and route to surgeon. Otherwise, separate preop APP input not needed so will remove from preop box.

## 2021-12-20 DIAGNOSIS — R7989 Other specified abnormal findings of blood chemistry: Secondary | ICD-10-CM | POA: Diagnosis not present

## 2021-12-20 DIAGNOSIS — Z01818 Encounter for other preprocedural examination: Secondary | ICD-10-CM | POA: Diagnosis not present

## 2021-12-20 DIAGNOSIS — D649 Anemia, unspecified: Secondary | ICD-10-CM | POA: Diagnosis not present

## 2021-12-20 DIAGNOSIS — I10 Essential (primary) hypertension: Secondary | ICD-10-CM | POA: Diagnosis not present

## 2021-12-20 DIAGNOSIS — E7800 Pure hypercholesterolemia, unspecified: Secondary | ICD-10-CM | POA: Diagnosis not present

## 2021-12-20 DIAGNOSIS — I2510 Atherosclerotic heart disease of native coronary artery without angina pectoris: Secondary | ICD-10-CM | POA: Diagnosis not present

## 2021-12-31 ENCOUNTER — Other Ambulatory Visit: Payer: Self-pay | Admitting: Physician Assistant

## 2021-12-31 MED ORDER — OXYCODONE-ACETAMINOPHEN 5-325 MG PO TABS: 1.0000 | ORAL_TABLET | Freq: Four times a day (QID) | ORAL | 0 refills | Status: DC | PRN

## 2021-12-31 MED ORDER — ONDANSETRON HCL 4 MG PO TABS: 4.0000 mg | ORAL_TABLET | Freq: Three times a day (TID) | ORAL | 0 refills | Status: DC | PRN

## 2021-12-31 MED ORDER — METHOCARBAMOL 750 MG PO TABS: 750.0000 mg | ORAL_TABLET | Freq: Two times a day (BID) | ORAL | 2 refills | Status: DC | PRN

## 2021-12-31 MED ORDER — DOCUSATE SODIUM 100 MG PO CAPS: 100.0000 mg | ORAL_CAPSULE | Freq: Every day | ORAL | 2 refills | Status: AC | PRN

## 2022-01-01 ENCOUNTER — Other Ambulatory Visit: Payer: Self-pay

## 2022-01-02 NOTE — Pre-Procedure Instructions (Signed)
Surgical Instructions    Your procedure is scheduled on Tuesday 01/08/22.   Report to [facility] Main Entrance "A" at 05:30 A.M., then check in with the Admitting office.  Call this number if you have problems the morning of surgery:  [phone]   If you have any questions prior to your surgery date call [phone]: Open Monday-Friday 8am-4pm    Remember:  Do not eat after midnight the night before your surgery  You may drink clear liquids until 04:30 A.M. the morning of your surgery.   Clear liquids allowed are: Water, Non-Citrus Juices (without pulp), Carbonated Beverages, Clear Tea, Black Coffee ONLY (NO MILK, CREAM OR POWDERED CREAMER of any kind), and Gatorade  Patient Instructions  The night before surgery:  No food after midnight. ONLY clear liquids after midnight  The day of surgery (if you do NOT have diabetes):  Drink ONE (1) Pre-Surgery Clear Ensure by 04:30 A.M. the morning of surgery. Drink in one sitting. Do not sip.  This drink was given to you during your hospital  pre-op appointment visit.  Nothing else to drink after completing the  Pre-Surgery Clear Ensure.         If you have questions, please contact your surgeon's office.     Take these medicines the morning of surgery with A SIP OF WATER:   atorvastatin (LIPITOR)  metoprolol tartrate (LOPRESSOR)   Take these medicines if needed:   docusate sodium (COLACE)   methocarbamol (ROBAXIN-750)  nitroGLYCERIN (NITROSTAT)  ondansetron (ZOFRAN)  timolol (TIMOPTIC)  Please follow your surgeon's instructions regarding Aspirin and Plavix. If you have not received instructions then contact your surgeon's office for instructions.  As of today, STOP taking any Aspirin (unless otherwise instructed by your surgeon) Aleve, Naproxen, Ibuprofen, Motrin, Advil, Goody's, BC's, all herbal medications, fish oil, and all vitamins.           Do not wear jewelry or makeup. Do not wear lotions, powders, perfumes/cologne  or deodorant. Do not shave 48 hours prior to surgery.  Men may shave face and neck. Do not bring valuables to the hospital. Do not wear nail polish, gel polish, artificial nails, or any other type of covering on natural nails (fingers and toes) If you have artificial nails or gel coating that need to be removed by a nail salon, please have this removed prior to surgery. Artificial nails or gel coating may interfere with anesthesia's ability to adequately monitor your vital signs.  [facility] is not responsible for any belongings or valuables.    Do NOT Smoke (Tobacco/Vaping)  24 hours prior to your procedure  If you use a CPAP at night, you may bring your mask for your overnight stay.   Contacts, glasses, hearing aids, dentures or partials may not be worn into surgery, please bring cases for these belongings   For patients admitted to the hospital, discharge time will be determined by your treatment team.   Patients discharged the day of surgery will not be allowed to drive home, and someone needs to stay with them for 24 hours.   SURGICAL WAITING ROOM VISITATION Patients having surgery or a procedure may have no more than 2 support people in the waiting area - these visitors may rotate.   Children under the age of 7 must have an adult with them who is not the patient. If the patient needs to stay at the hospital during part of their recovery, the visitor guidelines for inpatient rooms apply. Pre-op nurse will coordinate  an appropriate time for 1 support person to accompany patient in pre-op.  This support person may not rotate.   Please refer to the [facility] website for the visitor guidelines for Inpatients (after your surgery is over and you are in a regular room).    Special instructions:    Oral Hygiene is also important to reduce your risk of infection.  Remember - BRUSH YOUR TEETH THE MORNING OF SURGERY WITH YOUR REGULAR TOOTHPASTE   [place]- Preparing For  Surgery  Before surgery, you can play an important role. Because skin is not sterile, your skin needs to be as free of germs as possible. You can reduce the number of germs on your skin by washing with CHG (chlorahexidine gluconate) Soap before surgery.  CHG is an antiseptic cleaner which kills germs and bonds with the skin to continue killing germs even after washing.     Please do not use if you have an allergy to CHG or antibacterial soaps. If your skin becomes reddened/irritated stop using the CHG.  Do not shave (including legs and underarms) for at least 48 hours prior to first CHG shower. It is OK to shave your face.  Please follow these instructions carefully.     Shower the NIGHT BEFORE SURGERY and the MORNING OF SURGERY with CHG Soap.   If you chose to wash your hair, wash your hair first as usual with your normal shampoo. After you shampoo, rinse your hair and body thoroughly to remove the shampoo.  Then [employer] and genitals (private parts) with your normal soap and rinse thoroughly to remove soap.  After that Use CHG Soap as you would any other liquid soap. You can apply CHG directly to the skin and wash gently with a scrungie or a clean washcloth.   Apply the CHG Soap to your body ONLY FROM THE NECK DOWN.  Do not use on open wounds or open sores. Avoid contact with your eyes, ears, mouth and genitals (private parts). Wash Face and genitals (private parts)  with your normal soap.   Wash thoroughly, paying special attention to the area where your surgery will be performed.  Thoroughly rinse your body with warm water from the neck down.  DO NOT shower/wash with your normal soap after using and rinsing off the CHG Soap.  Pat yourself dry with a CLEAN TOWEL.  Wear CLEAN PAJAMAS to bed the night before surgery  Place CLEAN SHEETS on your bed the night before your surgery  DO NOT SLEEP WITH PETS.   Day of Surgery:  Take a shower with CHG soap. Wear Clean/Comfortable  clothing the morning of surgery Do not apply any deodorants/lotions.   Remember to brush your teeth WITH YOUR REGULAR TOOTHPASTE.    If you received a COVID test during your pre-op visit, it is requested that you wear a mask when out in public, stay away from anyone that may not be feeling well, and notify your surgeon if you develop symptoms. If you have been in contact with anyone that has tested positive in the last 10 days, please notify your surgeon.    Please read over the following fact sheets that you were given.

## 2022-01-03 ENCOUNTER — Encounter (HOSPITAL_COMMUNITY)
Admission: RE | Admit: 2022-01-03 | Discharge: 2022-01-03 | Disposition: A | Discharge status: Home / Self Care | Payer: Medicare Other | Source: Ambulatory Visit | Attending: Orthopaedic Surgery | Admitting: Orthopaedic Surgery

## 2022-01-03 ENCOUNTER — Other Ambulatory Visit: Payer: Self-pay

## 2022-01-03 ENCOUNTER — Encounter (HOSPITAL_COMMUNITY): Payer: Self-pay

## 2022-01-03 VITALS — BP 159/84 | HR 79 | Temp 97.8°F | Resp 17 | Ht 70.0 in | Wt 177.4 lb

## 2022-01-03 DIAGNOSIS — Z01812 Encounter for preprocedural laboratory examination: Secondary | ICD-10-CM | POA: Insufficient documentation

## 2022-01-03 DIAGNOSIS — I252 Old myocardial infarction: Secondary | ICD-10-CM | POA: Insufficient documentation

## 2022-01-03 DIAGNOSIS — M1712 Unilateral primary osteoarthritis, left knee: Secondary | ICD-10-CM | POA: Insufficient documentation

## 2022-01-03 DIAGNOSIS — Z86718 Personal history of other venous thrombosis and embolism: Secondary | ICD-10-CM | POA: Insufficient documentation

## 2022-01-03 DIAGNOSIS — D649 Anemia, unspecified: Secondary | ICD-10-CM | POA: Insufficient documentation

## 2022-01-03 DIAGNOSIS — Z01818 Encounter for other preprocedural examination: Secondary | ICD-10-CM

## 2022-01-03 DIAGNOSIS — I10 Essential (primary) hypertension: Secondary | ICD-10-CM | POA: Insufficient documentation

## 2022-01-03 DIAGNOSIS — I2510 Atherosclerotic heart disease of native coronary artery without angina pectoris: Secondary | ICD-10-CM | POA: Insufficient documentation

## 2022-01-03 LAB — CBC
HCT: 35 % — ABNORMAL LOW (ref 36.0–46.0)
Hemoglobin: 10 g/dL — ABNORMAL LOW (ref 12.0–15.0)
MCH: 21.6 pg — ABNORMAL LOW (ref 26.0–34.0)
MCHC: 28.6 g/dL — ABNORMAL LOW (ref 30.0–36.0)
MCV: 75.4 fL — ABNORMAL LOW (ref 80.0–100.0)
Platelets: 314 10*3/uL (ref 150–400)
RBC: 4.64 MIL/uL (ref 3.87–5.11)
RDW: 24 % — ABNORMAL HIGH (ref 11.5–15.5)
WBC: 6.1 10*3/uL (ref 4.0–10.5)
nRBC: 8.5 % — ABNORMAL HIGH (ref 0.0–0.2)

## 2022-01-03 LAB — BASIC METABOLIC PANEL
Anion gap: 6 (ref 5–15)
BUN: 16 mg/dL (ref 8–23)
CO2: 26 mmol/L (ref 22–32)
Calcium: 9.9 mg/dL (ref 8.9–10.3)
Chloride: 106 mmol/L (ref 98–111)
Creatinine, Ser: 1.07 mg/dL — ABNORMAL HIGH (ref 0.44–1.00)
GFR, Estimated: 54 mL/min — ABNORMAL LOW (ref >60)
Glucose, Bld: 111 mg/dL — ABNORMAL HIGH (ref 70–99)
Potassium: 4.4 mmol/L (ref 3.5–5.1)
Sodium: 138 mmol/L (ref 135–145)

## 2022-01-03 LAB — SURGICAL PCR SCREEN
MRSA, PCR: NEGATIVE
Staphylococcus aureus: NEGATIVE

## 2022-01-03 NOTE — Progress Notes (Signed)
Ordered a type and screen

## 2022-01-03 NOTE — Progress Notes (Signed)
PCP - Dr Donald Prose Cardiologist - Dr. Lauree Chandler  PPM/ICD - Denies  Chest x-ray - N/A EKG - 11/28/21 Stress Test - Denies ECHO - 05/05/17 Cardiac Cath - 05/01/17  Sleep Study - Denies  Diabetes: Denies  Blood Thinner Instructions: LD of Plavix: 12/30/21 Aspirin Instructions: LD: 01/02/22  ERAS Protcol - Tes, PRE-SURGERY Ensure  COVID TEST- N/A   Anesthesia review: Yes, cardiac hx; clearance on 11/28/21 in Epic  Patient denies shortness of breath, fever, cough and chest pain at PAT appointment   All instructions explained to the patient, with a verbal understanding of the material. Patient agrees to go over the instructions while at home for a better understanding. Patient also instructed to self quarantine after being tested for COVID-19. The opportunity to ask questions was provided.

## 2022-01-04 NOTE — Progress Notes (Signed)
Anesthesia Chart Review:  Follows with cardiology for hx of CAD status post acute inferior STEMI 05/01/2017 treated with balloon angioplasty and thrombectomy to the mid distal RCA.  No stent was placed.  Repeat cath 05/02/2017 at which time the PDA was treated with aspiration  thrombectomy.  Echo 04/04/2018 LVEF 55% with inferior wall hypokinesis also has history of hypertension, prior DVT. Seen by Estella Husk, PA-C 11/28/21 for preop clearance. Per note, "Preop clearance for knee arthroplasty by Dr.Xu in Sept. Patient with CAD as described below and no anginal symptoms. METs >5. No further testing indicated prior to surgery.   Dr. Angelena Form said patient can hold Plavix and ASA for up to one week prior to surgery. According to the Revised Cardiac Risk Index (RCRI), her Perioperative Risk of Major Cardiac Event is (%): 0.9. Her Functional Capacity in METs is: 5.07 according to the Duke Activity Status Index (DASI)."  Patient reports last dose Plavix 12/30/2021.  Preop labs reviewed, mild anemia with hemoglobin 10.0, otherwise unremarkable.  EKG 11/28/2021: NSR.  Rate 78.  Minimal voltage criteria for LVH, may be normal variant.  Lateral infarct, age undetermined.  Inferior infarct, age undetermined.  TTE 05/05/2017: - Left ventricle: Inferobasal hypokinesis The cavity size was    normal. Wall thickness was increased in a pattern of moderate    LVH. Systolic function was normal. The estimated ejection    fraction was 55%. Wall motion was normal; there were no regional    wall motion abnormalities. Doppler parameters are consistent with    abnormal left ventricular relaxation (grade 1 diastolic    dysfunction).  - Mitral valve: There was mild regurgitation.  - Left atrium: The atrium was mildly dilated.  - Atrial septum: No defect or patent foramen ovale was identified.  - Pulmonary arteries: PA peak pressure: 33 mm Hg (S).  - Impressions: Normal GLS -17.   Impressions:   - Normal GLS  -17.  Coronary thrombectomy 05/02/2017: Conclusions: 1. Persistent thrombotic occlusion of proximal rPDA. 2. Successful balloon angioplasty and aspiration thrombectomy with reestablishment of flow in the rPDA.  Residual nonocclusive thrombus in the distal vessel remains, which is too distal for intervention.   Recommendations: 1. Continue tirofiban infusion for an additional 18 hours, as hemoglobin tolerates, given baseline anemia. 2. Dual antiplatelet therapy with aspirin and ticagrelor for at least 12 months. 3. Aggressive secondary prevention.  Cath and PCI 05/01/2017: Dist RCA lesion is 100% stenosed. Balloon angioplasty was performed using a BALLOON SAPPHIRE 2.5X15. Post intervention, there is a 10% residual stenosis. Ost RPDA lesion is 100% stenosed. Mid Cx lesion is 20% stenosed. Prox LAD lesion is 30% stenosed. The left ventricular systolic function is normal. LV end diastolic pressure is normal. The left ventricular ejection fraction is 50-55% by visual estimate. There is no mitral valve regurgitation.   1. Acute inferior STEMI secondary to occluded mid to distal RCA 2. Successful PTCA/balloon angioplasty/aspiration thrombectomy of the mid/distal RCA 3. Mild non-obstructive disease in the Circumflex and LAD 4. Preserved LV systolic function with mild segmental wall motion abnormality   Recommendations: Will admit to ICU. I have elected not to place a stent in the distal RCA today as the vessel looked much improved after thrombectomy. The PDA remains occluded and could not be opened. Plans for Aggrastat infusion for next 24 hours then re-look cath tomorrow. Will continue ASA/Brlilinta/metoprolol and high dose statin. Will plan echo.      Wynonia Musty [facility]/Anesthesiology Phone [phone] 01/04/2022 4:01  PM

## 2022-01-04 NOTE — Anesthesia Preprocedure Evaluation (Addendum)
Anesthesia Evaluation  Patient identified by MRN, date of birth, ID band Patient awake    Reviewed: Allergy & Precautions, NPO status , Patient's Chart, lab work & pertinent test results, reviewed documented beta blocker date and time   Airway Mallampati: II  TM Distance: >3 FB Neck ROM: Full    Dental no notable dental hx. (+) Edentulous Upper, Edentulous Lower   Pulmonary former smoker,    Pulmonary exam normal breath sounds clear to auscultation       Cardiovascular hypertension, Pt. on medications and Pt. on home beta blockers + CAD, + Past MI and + Cardiac Stents  Normal cardiovascular exam Rhythm:Regular Rate:Normal  IW STEMI 05/01/2017 PCI with thrombectomy and angioplasty  Cardiac Cath 05/01/2017  Dist RCA lesion is 100% stenosed.  Balloon angioplasty was performed using a BALLOON SAPPHIRE 2.5X15.  Post intervention, there is a 10% residual stenosis.  Ost RPDA lesion is 100% stenosed.  Mid Cx lesion is 20% stenosed.  Prox LAD lesion is 30% stenosed.  The left ventricular systolic function is normal.  LV end diastolic pressure is normal.  The left ventricular ejection fraction is 50-55% by visual estimate.  There is no mitral valve regurgitation.   1. Acute inferior STEMI secondary to occluded mid to distal RCA 2. Successful PTCA/balloon angioplasty/aspiration thrombectomy of the mid/distal RCA 3. Mild non-obstructive disease in the Circumflex and LAD 4. Preserved LV systolic function with mild segmental wall motion abnormality  Repeat Cath 05/02/2017 Conclusions: 1. Persistent thrombotic occlusion of proximal rPDA. 2. Successful balloon angioplasty and aspiration thrombectomy with reestablishment of flow in the rPDA.  Residual nonocclusive thrombus in the distal vessel remains, which is too distal for intervention.  Recommendations: 1. Continue tirofiban infusion for an additional 18 hours, as hemoglobin tolerates,  given baseline anemia. 2. Dual antiplatelet therapy with aspirin and ticagrelor for at least 12 months. 3. Aggressive secondary prevention.  EKG 11/28/21 NSR, inferior and lateral infarcts, LVH  Echo 05/05/2017 Left ventricle: Inferobasal hypokinesis The cavity size was  normal. Wall thickness was increased in a pattern of moderate  LVH. Systolic function was normal. The estimated ejection  fraction was 55%. Wall motion was normal; there were no regional  wall motion abnormalities. Doppler parameters are consistent with  abnormal left ventricular relaxation (grade 1 diastolic  dysfunction).  - Mitral valve: There was mild regurgitation.  - Left atrium: The atrium was mildly dilated.  - Atrial septum: No defect or patent foramen ovale was identified.  - Pulmonary arteries: PA peak pressure: 33 mm Hg (S).  - Impressions: Normal GLS -17.    Neuro/Psych negative psych ROS   GI/Hepatic Neg liver ROS, hiatal hernia,   Endo/Other  Hyperlipidemia  Renal/GU Renal InsufficiencyRenal disease  negative genitourinary   Musculoskeletal  (+) Arthritis , Osteoarthritis,  OA left knee   Abdominal   Peds  Hematology  (+) Blood dyscrasia, anemia , Plavix therapy- last dose 9/17   Anesthesia Other Findings   Reproductive/Obstetrics                         Anesthesia Physical Anesthesia Plan  ASA: 3  Anesthesia Plan: Spinal   Post-op Pain Management: Tylenol PO (pre-op)*, Regional block*, Minimal or no pain anticipated and Dilaudid IV   Induction: Intravenous  PONV Risk Score and Plan: Treatment may vary due to age or medical condition and Propofol infusion  Airway Management Planned: Natural Airway  Additional Equipment: None  Intra-op Plan:   Post-operative Plan:  Extubation in OR  Informed Consent: I have reviewed the patients History and Physical, chart, labs and discussed the procedure including the risks, benefits and alternatives for  the proposed anesthesia with the patient or authorized representative who has indicated his/her understanding and acceptance.     Dental advisory given  Plan Discussed with: CRNA and Anesthesiologist  Anesthesia Plan Comments: (PAT note by Karoline Caldwell, PA-C: Follows with cardiology for hx of CAD status post acute inferior STEMI 05/01/2017 treated with balloon angioplasty and thrombectomy to the mid distal RCA. No stent was placed. Repeat cath 05/02/2017 at which time the PDA was treated with aspirationthrombectomy. Echo 04/04/2018 LVEF 55% with inferior wall hypokinesis also has history of hypertension, prior DVT. Seen by Estella Husk, PA-C 11/28/21 for preop clearance. Per note, "Preop clearance for knee arthroplasty by Dr.Xu in Sept. Patient with CAD as described below and no anginal symptoms. METs >5. No further testing indicated prior to surgery. Dr. Angelena Form said patient can hold Plavix and ASA for up to one week prior to surgery. According to the Revised Cardiac Risk Index (RCRI),herPerioperative Risk of Major Cardiac Event is (%): 0.9. HerFunctional Capacity in METs is: 5.07according to the Duke Activity Status Index (DASI)."  Patient reports last dose Plavix 12/30/2021.  Preop labs reviewed, mild anemia with hemoglobin 10.0, otherwise unremarkable.  EKG 11/28/2021: NSR.  Rate 78.  Minimal voltage criteria for LVH, may be normal variant.  Lateral infarct, age undetermined.  Inferior infarct, age undetermined.  TTE 05/05/2017: - Left ventricle: Inferobasal hypokinesis The cavity size was  normal. Wall thickness was increased in a pattern of moderate  LVH. Systolic function was normal. The estimated ejection  fraction was 55%. Wall motion was normal; there were no regional  wall motion abnormalities. Doppler parameters are consistent with  abnormal left ventricular relaxation (grade 1 diastolic  dysfunction).  - Mitral valve: There was mild regurgitation.  - Left  atrium: The atrium was mildly dilated.  - Atrial septum: No defect or patent foramen ovale was identified.  - Pulmonary arteries: PA peak pressure: 33 mm Hg (S).  - Impressions: Normal GLS -17.   Impressions:   - Normal GLS -17.  Coronary thrombectomy 05/02/2017: Conclusions: 1.         Persistent thrombotic occlusion of proximal rPDA. 2.         Successful balloon angioplasty and aspiration thrombectomy with reestablishment of flow in the rPDA.  Residual nonocclusive thrombus in the distal vessel remains, which is too distal for intervention.  Recommendations: 1.         Continue tirofiban infusion for an additional 18 hours, as hemoglobin tolerates, given baseline anemia. 2.         Dual antiplatelet therapy with aspirin and ticagrelor for at least 12 months. 3.         Aggressive secondary prevention.  Cath and PCI 05/01/2017: ? Dist RCA lesion is 100% stenosed. ? Balloon angioplasty was performed using a BALLOON SAPPHIRE 2.5X15. ? Post intervention, there is a 10% residual stenosis. ? Ost RPDA lesion is 100% stenosed. ? Mid Cx lesion is 20% stenosed. ? Prox LAD lesion is 30% stenosed. ? The left ventricular systolic function is normal. ? LV end diastolic pressure is normal. ? The left ventricular ejection fraction is 50-55% by visual estimate. ? There is no mitral valve regurgitation.  1. Acute inferior STEMI secondary to occluded mid to distal RCA 2. Successful PTCA/balloon angioplasty/aspiration thrombectomy of the mid/distal RCA 3. Mild non-obstructive disease in the Circumflex  and LAD 4. Preserved LV systolic function with mild segmental wall motion abnormality  Recommendations: Will admit to ICU. I have elected not to place a stent in the distal RCA today as the vessel looked much improved after thrombectomy. The PDA remains occluded and could not be opened. Plans for Aggrastat infusion for next 24 hours then re-look cath tomorrow. Will continue  ASA/Brlilinta/metoprolol and high dose statin. Will plan echo.   )      Anesthesia Quick Evaluation

## 2022-01-07 ENCOUNTER — Encounter (HOSPITAL_COMMUNITY): Payer: Self-pay | Admitting: Orthopaedic Surgery

## 2022-01-07 ENCOUNTER — Telehealth: Payer: Self-pay | Admitting: *Deleted

## 2022-01-07 ENCOUNTER — Other Ambulatory Visit: Payer: Self-pay | Admitting: Physician Assistant

## 2022-01-07 LAB — PATHOLOGIST SMEAR REVIEW

## 2022-01-07 NOTE — Telephone Encounter (Signed)
Ortho bundle pre-op call completed. 

## 2022-01-07 NOTE — Care Plan (Signed)
[facility] RNCM call to patient to discuss her upcoming Left total knee arthroplasty with Dr. Erlinda Hong on 01/08/22. She is an Ortho bundle patient through [facility] and is agreeable to case management. She lives with her daughter. Family will assist after discharge at home. She received a home CPM and RW prior to surgery through [facility]. She has a raised toilet seat as well. No further DME needed. Anticipate HHPT will be needed after short hospital stay. Referral made to [facility] after choice provided. Reviewed all post op care instructions. Will continue to follow for needs.

## 2022-01-08 ENCOUNTER — Observation Stay (HOSPITAL_COMMUNITY): Payer: Medicare Other

## 2022-01-08 ENCOUNTER — Ambulatory Visit (HOSPITAL_BASED_OUTPATIENT_CLINIC_OR_DEPARTMENT_OTHER): Payer: Medicare Other | Admitting: Anesthesiology

## 2022-01-08 ENCOUNTER — Other Ambulatory Visit: Payer: Self-pay

## 2022-01-08 ENCOUNTER — Inpatient Hospital Stay (HOSPITAL_COMMUNITY)
Admission: RE | Admit: 2022-01-08 | Discharge: 2022-01-10 | DRG: 470 | Disposition: A | Discharge status: Home Health | Payer: Medicare Other | Attending: Orthopaedic Surgery | Admitting: Orthopaedic Surgery

## 2022-01-08 ENCOUNTER — Encounter (HOSPITAL_COMMUNITY)
Admission: RE | Disposition: A | Discharge status: Home Health | Payer: Self-pay | Source: Home / Self Care | Attending: Orthopaedic Surgery

## 2022-01-08 ENCOUNTER — Ambulatory Visit (HOSPITAL_COMMUNITY): Payer: Medicare Other | Admitting: Physician Assistant

## 2022-01-08 ENCOUNTER — Encounter (HOSPITAL_COMMUNITY): Payer: Self-pay | Admitting: Orthopaedic Surgery

## 2022-01-08 DIAGNOSIS — M1712 Unilateral primary osteoarthritis, left knee: Principal | ICD-10-CM | POA: Diagnosis present

## 2022-01-08 DIAGNOSIS — Z471 Aftercare following joint replacement surgery: Secondary | ICD-10-CM | POA: Diagnosis not present

## 2022-01-08 DIAGNOSIS — D62 Acute posthemorrhagic anemia: Secondary | ICD-10-CM | POA: Diagnosis not present

## 2022-01-08 DIAGNOSIS — Z882 Allergy status to sulfonamides status: Secondary | ICD-10-CM | POA: Diagnosis not present

## 2022-01-08 DIAGNOSIS — Z79899 Other long term (current) drug therapy: Secondary | ICD-10-CM

## 2022-01-08 DIAGNOSIS — I2510 Atherosclerotic heart disease of native coronary artery without angina pectoris: Secondary | ICD-10-CM

## 2022-01-08 DIAGNOSIS — M2100 Valgus deformity, not elsewhere classified, unspecified site: Secondary | ICD-10-CM | POA: Diagnosis not present

## 2022-01-08 DIAGNOSIS — Z841 Family history of disorders of kidney and ureter: Secondary | ICD-10-CM | POA: Diagnosis not present

## 2022-01-08 DIAGNOSIS — Z888 Allergy status to other drugs, medicaments and biological substances status: Secondary | ICD-10-CM

## 2022-01-08 DIAGNOSIS — E785 Hyperlipidemia, unspecified: Secondary | ICD-10-CM | POA: Diagnosis not present

## 2022-01-08 DIAGNOSIS — Z7902 Long term (current) use of antithrombotics/antiplatelets: Secondary | ICD-10-CM | POA: Diagnosis not present

## 2022-01-08 DIAGNOSIS — M2342 Loose body in knee, left knee: Secondary | ICD-10-CM | POA: Diagnosis not present

## 2022-01-08 DIAGNOSIS — M21062 Valgus deformity, not elsewhere classified, left knee: Secondary | ICD-10-CM

## 2022-01-08 DIAGNOSIS — Z87891 Personal history of nicotine dependence: Secondary | ICD-10-CM

## 2022-01-08 DIAGNOSIS — M24562 Contracture, left knee: Secondary | ICD-10-CM

## 2022-01-08 DIAGNOSIS — I10 Essential (primary) hypertension: Secondary | ICD-10-CM

## 2022-01-08 DIAGNOSIS — I252 Old myocardial infarction: Secondary | ICD-10-CM

## 2022-01-08 DIAGNOSIS — Z96652 Presence of left artificial knee joint: Secondary | ICD-10-CM | POA: Diagnosis not present

## 2022-01-08 DIAGNOSIS — G8918 Other acute postprocedural pain: Secondary | ICD-10-CM | POA: Diagnosis not present

## 2022-01-08 DIAGNOSIS — Z7982 Long term (current) use of aspirin: Secondary | ICD-10-CM | POA: Diagnosis not present

## 2022-01-08 DIAGNOSIS — Z9889 Other specified postprocedural states: Secondary | ICD-10-CM | POA: Diagnosis not present

## 2022-01-08 HISTORY — PX: TOTAL KNEE ARTHROPLASTY: SHX125

## 2022-01-08 LAB — ABO/RH: ABO/RH(D): A POS

## 2022-01-08 SURGERY — ARTHROPLASTY, KNEE, TOTAL
Anesthesia: Spinal | Site: Knee | Laterality: Left

## 2022-01-08 MED ORDER — VANCOMYCIN HCL 1000 MG IV SOLR
INTRAVENOUS | Status: AC
  Filled 2022-01-08: qty 20

## 2022-01-08 MED ORDER — MENTHOL 3 MG MT LOZG: 1.0000 | LOZENGE | OROMUCOSAL | Status: DC | PRN

## 2022-01-08 MED ORDER — TRANEXAMIC ACID-NACL 1000-0.7 MG/100ML-% IV SOLN
1000.0000 mg | Freq: Once | INTRAVENOUS | Status: AC
  Administered 2022-01-08: 1000 mg via INTRAVENOUS
  Filled 2022-01-08: qty 100

## 2022-01-08 MED ORDER — CEFAZOLIN SODIUM-DEXTROSE 2-4 GM/100ML-% IV SOLN
2.0000 g | INTRAVENOUS | Status: AC
  Administered 2022-01-08: 2 g via INTRAVENOUS
  Filled 2022-01-08: qty 100

## 2022-01-08 MED ORDER — TRANEXAMIC ACID 1000 MG/10ML IV SOLN
2000.0000 mg | INTRAVENOUS | Status: DC
  Filled 2022-01-08: qty 20

## 2022-01-08 MED ORDER — DEXAMETHASONE SODIUM PHOSPHATE 10 MG/ML IJ SOLN
10.0000 mg | Freq: Once | INTRAMUSCULAR | Status: AC
  Administered 2022-01-09: 10 mg via INTRAVENOUS
  Filled 2022-01-08: qty 1

## 2022-01-08 MED ORDER — SODIUM CHLORIDE 0.9 % IV SOLN: INTRAVENOUS | Status: DC

## 2022-01-08 MED ORDER — BUPIVACAINE-MELOXICAM ER 400-12 MG/14ML IJ SOLN
INTRAMUSCULAR | Status: AC
  Filled 2022-01-08: qty 1

## 2022-01-08 MED ORDER — FENTANYL CITRATE (PF) 250 MCG/5ML IJ SOLN
INTRAMUSCULAR | Status: AC
  Filled 2022-01-08: qty 5

## 2022-01-08 MED ORDER — OXYCODONE HCL 5 MG PO TABS: 5.0000 mg | ORAL_TABLET | Freq: Once | ORAL | Status: DC | PRN

## 2022-01-08 MED ORDER — DEXAMETHASONE SODIUM PHOSPHATE 10 MG/ML IJ SOLN
INTRAMUSCULAR | Status: DC | PRN
  Administered 2022-01-08: 10 mg via INTRAVENOUS

## 2022-01-08 MED ORDER — METHOCARBAMOL 500 MG PO TABS: 500.0000 mg | ORAL_TABLET | Freq: Four times a day (QID) | ORAL | Status: DC | PRN

## 2022-01-08 MED ORDER — TRANEXAMIC ACID 1000 MG/10ML IV SOLN
INTRAVENOUS | Status: DC | PRN
  Administered 2022-01-08: 2000 mg via TOPICAL

## 2022-01-08 MED ORDER — PROPOFOL 500 MG/50ML IV EMUL
INTRAVENOUS | Status: DC | PRN
  Administered 2022-01-08: 100 ug/kg/min via INTRAVENOUS

## 2022-01-08 MED ORDER — OXYCODONE HCL ER 10 MG PO T12A
10.0000 mg | EXTENDED_RELEASE_TABLET | Freq: Two times a day (BID) | ORAL | Status: DC
  Administered 2022-01-08 – 2022-01-10 (×5): 10 mg via ORAL
  Filled 2022-01-08 (×5): qty 1

## 2022-01-08 MED ORDER — SODIUM CHLORIDE 0.9 % IR SOLN
Status: DC | PRN
  Administered 2022-01-08: 3000 mL

## 2022-01-08 MED ORDER — PROPOFOL 10 MG/ML IV BOLUS
INTRAVENOUS | Status: AC
  Filled 2022-01-08: qty 20

## 2022-01-08 MED ORDER — PHENOL 1.4 % MT LIQD: 1.0000 | OROMUCOSAL | Status: DC | PRN

## 2022-01-08 MED ORDER — METHOCARBAMOL 1000 MG/10ML IJ SOLN: 500.0000 mg | Freq: Four times a day (QID) | INTRAVENOUS | Status: DC | PRN

## 2022-01-08 MED ORDER — ONDANSETRON HCL 4 MG/2ML IJ SOLN
INTRAMUSCULAR | Status: DC | PRN
  Administered 2022-01-08: 4 mg via INTRAVENOUS

## 2022-01-08 MED ORDER — ACETAMINOPHEN 325 MG PO TABS
325.0000 mg | ORAL_TABLET | Freq: Four times a day (QID) | ORAL | Status: DC | PRN
  Administered 2022-01-09: 325 mg via ORAL
  Administered 2022-01-10: 650 mg via ORAL
  Filled 2022-01-08 (×3): qty 2

## 2022-01-08 MED ORDER — FERROUS SULFATE 325 (65 FE) MG PO TABS
325.0000 mg | ORAL_TABLET | Freq: Three times a day (TID) | ORAL | Status: DC
  Administered 2022-01-08 – 2022-01-10 (×7): 325 mg via ORAL
  Filled 2022-01-08 (×7): qty 1

## 2022-01-08 MED ORDER — ACETAMINOPHEN 500 MG PO TABS: 1000.0000 mg | ORAL_TABLET | Freq: Once | ORAL | Status: DC

## 2022-01-08 MED ORDER — ONDANSETRON HCL 4 MG PO TABS: 4.0000 mg | ORAL_TABLET | Freq: Four times a day (QID) | ORAL | Status: DC | PRN

## 2022-01-08 MED ORDER — METOPROLOL TARTRATE 25 MG PO TABS
25.0000 mg | ORAL_TABLET | Freq: Two times a day (BID) | ORAL | Status: DC
  Administered 2022-01-08 – 2022-01-10 (×4): 25 mg via ORAL
  Filled 2022-01-08 (×4): qty 1

## 2022-01-08 MED ORDER — ACETAMINOPHEN 10 MG/ML IV SOLN
INTRAVENOUS | Status: DC | PRN
  Administered 2022-01-08: 1000 mg via INTRAVENOUS

## 2022-01-08 MED ORDER — ORAL CARE MOUTH RINSE: 15.0000 mL | Freq: Once | OROMUCOSAL | Status: AC

## 2022-01-08 MED ORDER — BUPIVACAINE-EPINEPHRINE (PF) 0.5% -1:200000 IJ SOLN
INTRAMUSCULAR | Status: DC | PRN
  Administered 2022-01-08: 30 mL via PERINEURAL

## 2022-01-08 MED ORDER — METOCLOPRAMIDE HCL 5 MG PO TABS: 5.0000 mg | ORAL_TABLET | Freq: Three times a day (TID) | ORAL | Status: DC | PRN

## 2022-01-08 MED ORDER — LACTATED RINGERS IV SOLN: INTRAVENOUS | Status: DC

## 2022-01-08 MED ORDER — CHLORHEXIDINE GLUCONATE 0.12 % MT SOLN
15.0000 mL | Freq: Once | OROMUCOSAL | Status: AC
  Administered 2022-01-08: 15 mL via OROMUCOSAL
  Filled 2022-01-08: qty 15

## 2022-01-08 MED ORDER — VANCOMYCIN HCL 1000 MG IV SOLR
INTRAVENOUS | Status: DC | PRN
  Administered 2022-01-08: 1000 mg

## 2022-01-08 MED ORDER — CLOPIDOGREL BISULFATE 75 MG PO TABS
75.0000 mg | ORAL_TABLET | Freq: Every day | ORAL | Status: DC
  Administered 2022-01-09 – 2022-01-10 (×2): 75 mg via ORAL
  Filled 2022-01-08 (×2): qty 1

## 2022-01-08 MED ORDER — PHENYLEPHRINE HCL-NACL 20-0.9 MG/250ML-% IV SOLN
INTRAVENOUS | Status: DC | PRN
  Administered 2022-01-08: 40 ug/min via INTRAVENOUS

## 2022-01-08 MED ORDER — ONDANSETRON HCL 4 MG/2ML IJ SOLN: 4.0000 mg | Freq: Four times a day (QID) | INTRAMUSCULAR | Status: DC | PRN

## 2022-01-08 MED ORDER — DEXAMETHASONE SODIUM PHOSPHATE 10 MG/ML IJ SOLN
INTRAMUSCULAR | Status: AC
  Filled 2022-01-08: qty 1

## 2022-01-08 MED ORDER — OXYCODONE HCL 5 MG PO TABS: 10.0000 mg | ORAL_TABLET | ORAL | Status: DC | PRN

## 2022-01-08 MED ORDER — OXYCODONE HCL 5 MG PO TABS
5.0000 mg | ORAL_TABLET | ORAL | Status: DC | PRN
  Administered 2022-01-10: 10 mg via ORAL
  Filled 2022-01-08: qty 1
  Filled 2022-01-08: qty 2

## 2022-01-08 MED ORDER — BUPIVACAINE-MELOXICAM ER 400-12 MG/14ML IJ SOLN
INTRAMUSCULAR | Status: DC | PRN
  Administered 2022-01-08: 400 mg

## 2022-01-08 MED ORDER — ONDANSETRON HCL 4 MG/2ML IJ SOLN
INTRAMUSCULAR | Status: AC
  Filled 2022-01-08: qty 2

## 2022-01-08 MED ORDER — 0.9 % SODIUM CHLORIDE (POUR BTL) OPTIME
TOPICAL | Status: DC | PRN
  Administered 2022-01-08: 1000 mL

## 2022-01-08 MED ORDER — OXYCODONE HCL 5 MG/5ML PO SOLN: 5.0000 mg | Freq: Once | ORAL | Status: DC | PRN

## 2022-01-08 MED ORDER — IRRISEPT - 450ML BOTTLE WITH 0.05% CHG IN STERILE WATER, USP 99.95% OPTIME
TOPICAL | Status: DC | PRN
  Administered 2022-01-08: 450 mL via TOPICAL

## 2022-01-08 MED ORDER — FENTANYL CITRATE (PF) 250 MCG/5ML IJ SOLN
INTRAMUSCULAR | Status: DC | PRN
  Administered 2022-01-08 (×2): 50 ug via INTRAVENOUS

## 2022-01-08 MED ORDER — BUPIVACAINE IN DEXTROSE 0.75-8.25 % IT SOLN
INTRATHECAL | Status: DC | PRN
  Administered 2022-01-08: 2 mL via INTRATHECAL

## 2022-01-08 MED ORDER — DOCUSATE SODIUM 100 MG PO CAPS
100.0000 mg | ORAL_CAPSULE | Freq: Two times a day (BID) | ORAL | Status: DC
  Administered 2022-01-09 – 2022-01-10 (×3): 100 mg via ORAL
  Filled 2022-01-08 (×4): qty 1

## 2022-01-08 MED ORDER — HYDROMORPHONE HCL 1 MG/ML IJ SOLN: 0.2500 mg | INTRAMUSCULAR | Status: DC | PRN

## 2022-01-08 MED ORDER — CEFAZOLIN SODIUM-DEXTROSE 2-4 GM/100ML-% IV SOLN
2.0000 g | Freq: Four times a day (QID) | INTRAVENOUS | Status: AC
  Administered 2022-01-08 (×2): 2 g via INTRAVENOUS
  Filled 2022-01-08 (×2): qty 100

## 2022-01-08 MED ORDER — ASPIRIN 81 MG PO CHEW
81.0000 mg | CHEWABLE_TABLET | Freq: Every day | ORAL | Status: DC
  Administered 2022-01-09 – 2022-01-10 (×2): 81 mg via ORAL
  Filled 2022-01-08 (×2): qty 1

## 2022-01-08 MED ORDER — METOCLOPRAMIDE HCL 5 MG/ML IJ SOLN: 5.0000 mg | Freq: Three times a day (TID) | INTRAMUSCULAR | Status: DC | PRN

## 2022-01-08 MED ORDER — POVIDONE-IODINE 10 % EX SWAB
2.0000 | Freq: Once | CUTANEOUS | Status: AC
  Administered 2022-01-08: 2 via TOPICAL

## 2022-01-08 MED ORDER — ACETAMINOPHEN 500 MG PO TABS
1000.0000 mg | ORAL_TABLET | Freq: Four times a day (QID) | ORAL | Status: AC
  Administered 2022-01-08 (×2): 1000 mg via ORAL
  Filled 2022-01-08 (×2): qty 2

## 2022-01-08 MED ORDER — TRANEXAMIC ACID 1000 MG/10ML IV SOLN
2000.0000 mg | Freq: Once | INTRAVENOUS | Status: DC
  Filled 2022-01-08: qty 20

## 2022-01-08 MED ORDER — HYDROMORPHONE HCL 1 MG/ML IJ SOLN: 0.5000 mg | INTRAMUSCULAR | Status: DC | PRN

## 2022-01-08 MED ORDER — ACETAMINOPHEN 10 MG/ML IV SOLN
INTRAVENOUS | Status: AC
  Filled 2022-01-08: qty 100

## 2022-01-08 MED ORDER — ONDANSETRON HCL 4 MG/2ML IJ SOLN: 4.0000 mg | Freq: Once | INTRAMUSCULAR | Status: DC | PRN

## 2022-01-08 MED ORDER — TRANEXAMIC ACID-NACL 1000-0.7 MG/100ML-% IV SOLN
1000.0000 mg | INTRAVENOUS | Status: AC
  Administered 2022-01-08: 1000 mg via INTRAVENOUS
  Filled 2022-01-08: qty 100

## 2022-01-08 SURGICAL SUPPLY — 88 items
ADH SKN CLS APL DERMABOND .7 (GAUZE/BANDAGES/DRESSINGS) ×1
ALCOHOL 70% 16 OZ (MISCELLANEOUS) ×2 IMPLANT
BAG COUNTER SPONGE SURGICOUNT (BAG) IMPLANT
BAG DECANTER FOR FLEXI CONT (MISCELLANEOUS) ×2 IMPLANT
BAG SPNG CNTER NS LX DISP (BAG)
BANDAGE ESMARK 6X9 LF (GAUZE/BANDAGES/DRESSINGS) IMPLANT
BLADE SAG 18X100X1.27 (BLADE) ×2 IMPLANT
BLADE SAW SGTL 13.0X1.19X90.0M (BLADE) IMPLANT
BNDG CMPR 9X6 STRL LF SNTH (GAUZE/BANDAGES/DRESSINGS)
BNDG ESMARK 6X9 LF (GAUZE/BANDAGES/DRESSINGS)
BOWL SMART MIX CTS (DISPOSABLE) ×2 IMPLANT
BSPLAT TIB 5D F CMNT KN LT (Knees) ×1 IMPLANT
CEMENT BONE REFOBACIN R1X40 US (Cement) IMPLANT
CLSR STERI-STRIP ANTIMIC 1/2X4 (GAUZE/BANDAGES/DRESSINGS) ×4 IMPLANT
COOLER ICEMAN CLASSIC (MISCELLANEOUS) ×2 IMPLANT
COVER SURGICAL LIGHT HANDLE (MISCELLANEOUS) ×2 IMPLANT
CUFF TOURN SGL QUICK 34 (TOURNIQUET CUFF) ×1
CUFF TOURN SGL QUICK 42 (TOURNIQUET CUFF) IMPLANT
CUFF TRNQT CYL 34X4.125X (TOURNIQUET CUFF) ×2 IMPLANT
DERMABOND ADVANCED .7 DNX12 (GAUZE/BANDAGES/DRESSINGS) ×2 IMPLANT
DRAPE EXTREMITY T 121X128X90 (DISPOSABLE) ×2 IMPLANT
DRAPE HALF SHEET 40X57 (DRAPES) ×2 IMPLANT
DRAPE INCISE IOBAN 66X45 STRL (DRAPES) ×2 IMPLANT
DRAPE ORTHO SPLIT 77X108 STRL (DRAPES) ×2
DRAPE POUCH INSTRU U-SHP 10X18 (DRAPES) ×2 IMPLANT
DRAPE SURG ORHT 6 SPLT 77X108 (DRAPES) ×4 IMPLANT
DRAPE U-SHAPE 47X51 STRL (DRAPES) ×4 IMPLANT
DRSG AQUACEL AG ADV 3.5X10 (GAUZE/BANDAGES/DRESSINGS) ×2 IMPLANT
DURAPREP 26ML APPLICATOR (WOUND CARE) ×6 IMPLANT
ELECT CAUTERY BLADE 6.4 (BLADE) ×2 IMPLANT
ELECT REM PT RETURN 9FT ADLT (ELECTROSURGICAL) ×1
ELECTRODE REM PT RTRN 9FT ADLT (ELECTROSURGICAL) ×2 IMPLANT
FEMUR  CMT CCR STD SZ8 L KNEE (Knees) ×1 IMPLANT
FEMUR CMT CCR STD SZ8 L KNEE (Knees) ×1 IMPLANT
FEMUR CMTD CCR STD SZ8 L KNEE (Knees) IMPLANT
GLOVE BIOGEL PI IND STRL 7.0 (GLOVE) ×4 IMPLANT
GLOVE BIOGEL PI IND STRL 7.5 (GLOVE) ×10 IMPLANT
GLOVE ECLIPSE 7.0 STRL STRAW (GLOVE) ×6 IMPLANT
GLOVE ECLIPSE 7.5 STRL STRAW (GLOVE) IMPLANT
GLOVE SKINSENSE STRL SZ7.5 (GLOVE) ×6 IMPLANT
GLOVE SURG SYN 7.5  E (GLOVE) ×2
GLOVE SURG SYN 7.5 E (GLOVE) ×2 IMPLANT
GLOVE SURG SYN 7.5 PF PI (GLOVE) ×4 IMPLANT
GLOVE SURG UNDER LTX SZ7.5 (GLOVE) ×4 IMPLANT
GLOVE SURG UNDER POLY LF SZ7 (GLOVE) ×4 IMPLANT
GOWN STRL REIN XL XLG (GOWN DISPOSABLE) ×2 IMPLANT
GOWN STRL REUS W/ TWL LRG LVL3 (GOWN DISPOSABLE) ×2 IMPLANT
GOWN STRL REUS W/TWL LRG LVL3 (GOWN DISPOSABLE) ×1
HANDPIECE INTERPULSE COAX TIP (DISPOSABLE) ×1
HDLS TROCR DRIL PIN KNEE 75 (PIN) ×1
HOOD PEEL AWAY FLYTE STAYCOOL (MISCELLANEOUS) ×4 IMPLANT
JET LAVAGE IRRISEPT WOUND (IRRIGATION / IRRIGATOR) ×1
KIT BASIN OR (CUSTOM PROCEDURE TRAY) ×2 IMPLANT
KIT TURNOVER KIT B (KITS) ×2 IMPLANT
LAVAGE JET IRRISEPT WOUND (IRRIGATION / IRRIGATOR) ×2 IMPLANT
LINER TIB KNEE PS EF/6-9 12 LT (Liner) IMPLANT
MANIFOLD NEPTUNE II (INSTRUMENTS) ×2 IMPLANT
MARKER SKIN DUAL TIP RULER LAB (MISCELLANEOUS) ×4 IMPLANT
NDL SPNL 18GX3.5 QUINCKE PK (NEEDLE) ×2 IMPLANT
NEEDLE SPNL 18GX3.5 QUINCKE PK (NEEDLE) ×1 IMPLANT
NS IRRIG 1000ML POUR BTL (IV SOLUTION) ×2 IMPLANT
PACK TOTAL JOINT (CUSTOM PROCEDURE TRAY) ×2 IMPLANT
PAD ARMBOARD 7.5X6 YLW CONV (MISCELLANEOUS) ×4 IMPLANT
PAD COLD SHLDR WRAP-ON (PAD) ×2 IMPLANT
PIN DRILL HDLS TROCAR 75 4PK (PIN) IMPLANT
SAW OSC TIP CART 19.5X105X1.3 (SAW) ×2 IMPLANT
SCREW HEADED 33MM KNEE (MISCELLANEOUS) IMPLANT
SET HNDPC FAN SPRY TIP SCT (DISPOSABLE) ×2 IMPLANT
STAPLER VISISTAT 35W (STAPLE) IMPLANT
STEM POLY PAT PLY 35M KNEE (Knees) IMPLANT
STEM TIB ST PERS 14+30 (Stem) IMPLANT
STEM TIBIA 5 DEG SZ F L KNEE (Knees) IMPLANT
SUCTION FRAZIER HANDLE 10FR (MISCELLANEOUS) ×1
SUCTION TUBE FRAZIER 10FR DISP (MISCELLANEOUS) ×2 IMPLANT
SUT ETHILON 2 0 FS 18 (SUTURE) IMPLANT
SUT MNCRL AB 3-0 PS2 27 (SUTURE) IMPLANT
SUT VIC AB 0 CT1 27 (SUTURE) ×2
SUT VIC AB 0 CT1 27XBRD ANBCTR (SUTURE) ×4 IMPLANT
SUT VIC AB 1 CTX 27 (SUTURE) ×6 IMPLANT
SUT VIC AB 2-0 CT1 27 (SUTURE) ×5
SUT VIC AB 2-0 CT1 TAPERPNT 27 (SUTURE) ×8 IMPLANT
SYR 50ML LL SCALE MARK (SYRINGE) ×4 IMPLANT
TIBIA STEM 5 DEG SZ F L KNEE (Knees) ×1 IMPLANT
TOWEL GREEN STERILE (TOWEL DISPOSABLE) ×2 IMPLANT
TOWEL GREEN STERILE FF (TOWEL DISPOSABLE) ×2 IMPLANT
TRAY CATH 16FR W/PLASTIC CATH (SET/KITS/TRAYS/PACK) IMPLANT
UNDERPAD 30X36 HEAVY ABSORB (UNDERPADS AND DIAPERS) ×2 IMPLANT
YANKAUER SUCT BULB TIP NO VENT (SUCTIONS) ×4 IMPLANT

## 2022-01-08 NOTE — H&P (Signed)
PREOPERATIVE H&P  Chief Complaint: left knee degenerative joint disease  HPI: Jenna Thompson is a 76 y.o. female who presents for surgical treatment of left knee degenerative joint disease.  She denies any changes in medical history.  Past Medical History:  Diagnosis Date   Anemia, iron deficiency    Arthritis    L>R knee   Atrophic gastritis without mention of hemorrhage    CAD (coronary artery disease)    a. s/p Inferior STEMI in 04/2017 with angioplasty and thrombectomy to mid-RCA, repeat cath later that admission with aspiration thrombectomy of rPDA and residual thrombus along distal vessel too small for intervention   Colon polyps    Diaphragmatic hernia without mention of obstruction or gangrene    Diverticulosis of colon (without mention of hemorrhage)    Hiatal hernia    Hypertension    Unspecified hemorrhoids without mention of complication    Past Surgical History:  Procedure Laterality Date   ABDOMINAL HYSTERECTOMY  1978   CHOLECYSTECTOMY  1979   CORONARY ANGIOGRAPHY N/A 05/02/2017   Procedure: CORONARY ANGIOGRAPHY (CATH LAB);  Surgeon: Nelva Bush, MD;  Location: [facility];  Service: Cardiovascular;  Laterality: N/A;   CORONARY BALLOON ANGIOPLASTY N/A 05/02/2017   Procedure: CORONARY BALLOON ANGIOPLASTY;  Surgeon: Nelva Bush, MD;  Location: [facility];  Service: Cardiovascular;  Laterality: N/A;   CORONARY THROMBECTOMY N/A 05/02/2017   Procedure: Coronary Thrombectomy;  Surgeon: Nelva Bush, MD;  Location: [facility];  Service: Cardiovascular;  Laterality: N/A;   CORONARY/GRAFT ACUTE MI REVASCULARIZATION N/A 05/01/2017   Procedure: Coronary/Graft Acute MI Revascularization;  Surgeon: Burnell Blanks, MD;  Location: [facility];  Service: Cardiovascular;  Laterality: N/A;   KNEE SURGERY  2001   left    LEFT HEART CATH AND CORONARY ANGIOGRAPHY N/A 05/01/2017   Procedure: LEFT HEART CATH AND CORONARY ANGIOGRAPHY;   Surgeon: Burnell Blanks, MD;  Location: [facility];  Service: Cardiovascular;  Laterality: N/A;   TONSILLECTOMY AND ADENOIDECTOMY  1970   Social History   Socioeconomic History   Marital status: Widowed    Spouse name: Not on file   Number of children: 4   Years of education: Not on file   Highest education level: Not on file  Occupational History   Occupation: Retired  Tobacco Use   Smoking status: Former   Smokeless tobacco: Never  Scientific laboratory technician Use: Never used  Substance and Sexual Activity   Alcohol use: No   Drug use: No   Sexual activity: Not on file  Other Topics Concern   Not on file  Social History Narrative   2 caffeine drinks daily   Social Determinants of Health   Financial Resource Strain: Not on file  Food Insecurity: Not on file  Transportation Needs: Not on file  Physical Activity: Not on file  Stress: Not on file  Social Connections: Not on file   Family History  Problem Relation Age of Onset   Kidney disease Mother    Colon cancer Neg Hx    Breast cancer Neg Hx    Allergies  Allergen Reactions   Macrobid [Nitrofurantoin Monohyd Macro] Nausea And Vomiting and Other (See Comments)    Stomach pain   Sulfa Antibiotics Nausea And Vomiting and Other (See Comments)    Lethargic, tired   Prior to Admission medications   Medication Sig Start Date End Date Taking? Authorizing Provider  acetaminophen (TYLENOL) 650 MG  CR tablet Take 650 mg by mouth every 8 (eight) hours as needed for pain.   Yes [provider]  aspirin 81 MG chewable tablet Chew 1 tablet (81 mg total) by mouth daily. 05/06/17  Yes Laverda Page B, NP  atorvastatin (LIPITOR) 80 MG tablet TAKE 1 TABLET BY MOUTH  DAILY 01/26/21  Yes Kathleene Hazel, MD  clopidogrel (PLAVIX) 75 MG tablet TAKE 1 TABLET BY MOUTH  DAILY 01/26/21  Yes Kathleene Hazel, MD  cyanocobalamin (VITAMIN B12) 1000 MCG tablet Take 1,000 mcg by mouth daily.   Yes [provider]  metoprolol tartrate (LOPRESSOR) 25 MG tablet TAKE 1 TABLET BY MOUTH  TWICE DAILY 01/26/21  Yes Kathleene Hazel, MD  Multiple Vitamins-Minerals (MULTIVITAMIN WITH MINERALS) tablet Take 1 tablet by mouth daily.   Yes [provider]  nitroGLYCERIN (NITROSTAT) 0.4 MG SL tablet DISSOLVE 1 TABLET UNDER THE  TONGUE EVERY 5 MINUTES AS NEEDED FOR CHEST PAIN. MAX OF 3 TABLETS IN 15 MINUTES. CALL 911 IF PAIN  PERSISTS. 11/20/21  Yes Kathleene Hazel, MD  timolol (TIMOPTIC) 0.5 % ophthalmic solution Place 1 drop into both eyes daily. 11/14/10  Yes [provider]  Calcium Carbonate Antacid 1177 MG CHEW Chew 1,177 mg by mouth as needed (as needed for heartburn).    [provider]  COVID-19 mRNA Vac-TriS, Pfizer, (PFIZER-BIONT COVID-19 VAC-TRIS) SUSP injection Inject into the muscle. 11/29/20   Judyann Munson, MD  docusate sodium (COLACE) 100 MG capsule Take 1 capsule (100 mg total) by mouth daily as needed. 12/31/21 12/31/22  Cristie Hem, PA-C  methocarbamol (ROBAXIN-750) 750 MG tablet Take 1 tablet (750 mg total) by mouth 2 (two) times daily as needed for muscle spasms. 12/31/21   Cristie Hem, PA-C  ondansetron (ZOFRAN) 4 MG tablet Take 1 tablet (4 mg total) by mouth every 8 (eight) hours as needed for nausea or vomiting. 12/31/21   Cristie Hem, PA-C  oxyCODONE-acetaminophen (PERCOCET) 5-325 MG tablet Take 1-2 tablets by mouth every 6 (six) hours as needed. To be taken after surgery 12/31/21   Cristie Hem, PA-C     Positive ROS: All other systems have been reviewed and were otherwise negative with the exception of those mentioned in the HPI and as above.  Physical Exam: General: Alert, no acute distress Cardiovascular: No pedal edema Respiratory: No cyanosis, no use of accessory musculature GI: abdomen soft Skin: No lesions in the area of chief complaint Neurologic: Sensation intact distally Psychiatric: Patient is competent for  consent with normal mood and affect Lymphatic: no lymphedema  MUSCULOSKELETAL: exam stable  Assessment: left knee degenerative joint disease  Plan: Plan for Procedure(s): LEFT TOTAL KNEE ARTHROPLASTY  The risks benefits and alternatives were discussed with the patient including but not limited to the risks of nonoperative treatment, versus surgical intervention including infection, bleeding, nerve injury,  blood clots, cardiopulmonary complications, morbidity, mortality, among others, and they were willing to proceed.   Glee Arvin, MD 01/08/2022 6:00 AM

## 2022-01-08 NOTE — Plan of Care (Signed)
?  Problem: Education: ?Goal: Knowledge of the prescribed therapeutic regimen will improve ?Outcome: Progressing ?  ?Problem: Activity: ?Goal: Ability to avoid complications of mobility impairment will improve ?Outcome: Progressing ?  ?Problem: Pain Management: ?Goal: Pain level will decrease with appropriate interventions ?Outcome: Progressing ?  ?Problem: Skin Integrity: ?Goal: Will show signs of wound healing ?Outcome: Progressing ?  ?

## 2022-01-08 NOTE — Anesthesia Procedure Notes (Signed)
Procedure Name: MAC Date/Time: 01/08/2022 7:29 AM  Performed by: Kyung Rudd, CRNAPre-anesthesia Checklist: Patient identified, Emergency Drugs available, Suction available and Patient being monitored Patient Re-evaluated:Patient Re-evaluated prior to induction Oxygen Delivery Method: Simple face mask Preoxygenation: Pre-oxygenation with 100% oxygen Induction Type: IV induction Placement Confirmation: positive ETCO2 Dental Injury: Teeth and Oropharynx as per pre-operative assessment

## 2022-01-08 NOTE — Plan of Care (Signed)
  Problem: Education: Goal: Knowledge of General Education information will improve Description: Including pain rating scale, medication(s)/side effects and non-pharmacologic comfort measures Outcome: Progressing   Problem: Health Behavior/Discharge Planning: Goal: Ability to manage health-related needs will improve Outcome: Progressing   Problem: Clinical Measurements: Goal: Will remain free from infection Outcome: Progressing   Problem: Activity: Goal: Risk for activity intolerance will decrease Outcome: Progressing   Problem: Nutrition: Goal: Adequate nutrition will be maintained Outcome: Progressing   Problem: Elimination: Goal: Will not experience complications related to bowel motility Outcome: Progressing   Problem: Pain Managment: Goal: General experience of comfort will improve Outcome: Progressing   

## 2022-01-08 NOTE — Anesthesia Procedure Notes (Signed)
Anesthesia Regional Block: Adductor canal block   Pre-Anesthetic Checklist: , timeout performed,  Correct Patient, Correct Site, Correct Laterality,  Correct Procedure, Correct Position, site marked,  Risks and benefits discussed,  Surgical consent,  Pre-op evaluation,  At surgeon's request and post-op pain management  Laterality: Left  Prep: chloraprep       Needles:  Injection technique: Single-shot  Needle Type: Echogenic Stimulator Needle     Needle Length: 10cm  Needle Gauge: 21   Needle insertion depth: 7 cm   Additional Needles:   Procedures:,,,, ultrasound used (permanent image in chart),,    Narrative:  Start time: 01/08/2022 7:05 AM End time: 01/08/2022 7:10 AM Injection made incrementally with aspirations every 5 mL.  Performed by: Personally  Anesthesiologist: Josephine Igo, MD  Additional Notes: Timeout performed. Patient sedated. Relevant anatomy ID'd using [place]. Incremental 2-93ml injection of LA with frequent aspiration. Patient tolerated procedure well.     Left Adductor Canal Block

## 2022-01-08 NOTE — Transfer of Care (Signed)
Immediate Anesthesia Transfer of Care Note  Patient: Jenna Thompson  Procedure(s) Performed: LEFT TOTAL KNEE ARTHROPLASTY (Left: Knee)  Patient Location: PACU  Anesthesia Type:Spinal and MAC combined with regional for post-op pain  Level of Consciousness: awake, alert , and oriented  Airway & Oxygen Therapy: Patient Spontanous Breathing  Post-op Assessment: Report given to RN and Post -op Vital signs reviewed and stable  Post vital signs: Reviewed and stable  Last Vitals:  Vitals Value Taken Time  BP    Temp    Pulse    Resp    SpO2      Last Pain:  Vitals:   01/08/22 0601  TempSrc:   PainSc: 5          Complications: No notable events documented.

## 2022-01-08 NOTE — Discharge Instructions (Signed)

## 2022-01-08 NOTE — Anesthesia Procedure Notes (Signed)
Spinal  Patient location during procedure: OR Start time: 01/08/2022 7:18 AM End time: 01/08/2022 7:22 AM Reason for block: surgical anesthesia Staffing Performed: anesthesiologist  Anesthesiologist: Josephine Igo, MD Performed by: Josephine Igo, MD Authorized by: Josephine Igo, MD   Preanesthetic Checklist Completed: patient identified, IV checked, site marked, risks and benefits discussed, surgical consent, monitors and equipment checked, pre-op evaluation and timeout performed Spinal Block Patient position: sitting Prep: DuraPrep and site prepped and draped Patient monitoring: heart rate, cardiac monitor, continuous pulse ox and blood pressure Approach: midline Location: L3-4 Injection technique: single-shot Needle Needle type: Pencan  Needle gauge: 24 G Needle length: 9 cm Assessment Sensory level: T6 Events: CSF return Additional Notes Patient tolerated procedure well. Adequate sensory level.

## 2022-01-08 NOTE — Evaluation (Signed)
Physical Therapy Evaluation Patient Details Name: Jenna Thompson MRN: [id number] DOB: January 28, 1946 Today's Date: 01/08/2022  History of Present Illness  Pt is a 76 y/o female s/p L TKA. PMH includes CAD and HTN.  Clinical Impression  Pt is s/p surgery above with deficits below. Requiring min to min guard for safety to transfer to chair this session. Educated about knee precautions. Reports daughter will be able to assist at d/c. Will continue to follow acutely.        Recommendations for follow up therapy are one component of a multi-disciplinary discharge planning process, led by the attending physician.  Recommendations may be updated based on patient status, additional functional criteria and insurance authorization.  Follow Up Recommendations Follow physician's recommendations for discharge plan and follow up therapies      Assistance Recommended at Discharge Intermittent Supervision/Assistance  Patient can return home with the following  Help with stairs or ramp for entrance;Assist for transportation;Assistance with cooking/housework    Equipment Recommendations BSC/3in1 (has been ordered, but not delivered)  Recommendations for Other Services       Functional Status Assessment Patient has had a recent decline in their functional status and demonstrates the ability to make significant improvements in function in a reasonable and predictable amount of time.     Precautions / Restrictions Precautions Precautions: Knee Precaution Booklet Issued: No Precaution Comments: Verbally reviewed knee precautions. Restrictions Weight Bearing Restrictions: Yes LLE Weight Bearing: Weight bearing as tolerated      Mobility  Bed Mobility Overal bed mobility: Needs Assistance Bed Mobility: Supine to Sit     Supine to sit: Min assist     General bed mobility comments: Assist for LLE assist.    Transfers Overall transfer level: Needs assistance Equipment used: Rolling walker (2  wheels) Transfers: Sit to/from Stand, Bed to chair/wheelchair/BSC Sit to Stand: Min assist Stand pivot transfers: Min guard         General transfer comment: Min A for lift assist and steadying to stand. Cues for upright posture and anterior translation as pt with posterior lean initially. Min guard for safety to take steps to recliner.    Ambulation/Gait                  Stairs            Wheelchair Mobility    Modified Rankin (Stroke Patients Only)       Balance Overall balance assessment: Needs assistance Sitting-balance support: No upper extremity supported, Feet supported Sitting balance-Leahy Scale: Good     Standing balance support: Bilateral upper extremity supported Standing balance-Leahy Scale: Poor Standing balance comment: Reliant on UE support                             Pertinent Vitals/Pain Pain Assessment Pain Assessment: Faces Faces Pain Scale: Hurts even more Pain Location: L knee Pain Descriptors / Indicators: Grimacing, Guarding Pain Intervention(s): Limited activity within patient's tolerance, Monitored during session, Repositioned, Premedicated before session    Home Living Family/patient expects to be discharged to:: Private residence Living Arrangements: Children Available Help at Discharge: Family Type of Home: House Home Access: Level entry     Alternate Level Stairs-Number of Steps: flight Home Layout: Two level Home Equipment: Conservation officer, nature (2 wheels)      Prior Function Prior Level of Function : Independent/Modified Independent  Hand Dominance        Extremity/Trunk Assessment   Upper Extremity Assessment Upper Extremity Assessment: Defer to OT evaluation    Lower Extremity Assessment Lower Extremity Assessment: LLE deficits/detail LLE Deficits / Details: Deficits consistent with post op pain and weakness.    Cervical / Trunk Assessment Cervical / Trunk  Assessment: Normal  Communication   Communication: No difficulties  Cognition Arousal/Alertness: Awake/alert Behavior During Therapy: WFL for tasks assessed/performed Overall Cognitive Status: Within Functional Limits for tasks assessed                                          General Comments      Exercises     Assessment/Plan    PT Assessment Patient needs continued PT services  PT Problem List Decreased strength;Decreased range of motion;Decreased activity tolerance;Decreased balance;Decreased mobility;Decreased knowledge of use of DME;Decreased knowledge of precautions;Pain       PT Treatment Interventions DME instruction;Gait training;Stair training;Functional mobility training;Therapeutic activities;Therapeutic exercise;Balance training;Patient/family education    PT Goals (Current goals can be found in the Care Plan section)  Acute Rehab PT Goals Patient Stated Goal: to go home PT Goal Formulation: With patient Time For Goal Achievement: 01/22/22 Potential to Achieve Goals: Good    Frequency 7X/week     Co-evaluation               AM-PAC PT "6 Clicks" Mobility  Outcome Measure Help needed turning from your back to your side while in a flat bed without using bedrails?: A Little Help needed moving from lying on your back to sitting on the side of a flat bed without using bedrails?: A Little Help needed moving to and from a bed to a chair (including a wheelchair)?: A Little Help needed standing up from a chair using your arms (e.g., wheelchair or bedside chair)?: A Little Help needed to walk in hospital room?: A Little Help needed climbing 3-5 steps with a railing? : A Lot 6 Click Score: 17    End of Session Equipment Utilized During Treatment: Gait belt Activity Tolerance: Patient tolerated treatment well Patient left: in chair;with call bell/phone within reach;with family/visitor present Nurse Communication: Mobility status PT Visit  Diagnosis: Unsteadiness on feet (R26.81);Muscle weakness (generalized) (M62.81)    Time: 1410-1431 PT Time Calculation (min) (ACUTE ONLY): 21 min   Charges:   PT Evaluation $PT Eval Low Complexity: 1 Low          Reuel Derby, PT, DPT  Acute Rehabilitation Services  Office: [phone]   Rudean Hitt 01/08/2022, 4:38 PM

## 2022-01-08 NOTE — Op Note (Signed)
Total Knee Arthroplasty Procedure Note  Preoperative diagnosis: Left knee osteoarthritis  Postoperative diagnosis:same  Operative findings: Severe tricompartmental arthritis Valgus deformity, significant wear on extension surface of LFC 3 large loose bodies Mild laxity of MCL but competent 10 degree flexion contracture  Operative procedure: Left total knee arthroplasty. CPT [phone]  Surgeon: N. Glee Arvin, MD  Assist: Oneal Grout, PA-C; necessary for the timely completion of procedure and due to complexity of procedure.  Anesthesia: Spinal, regional, local  Tourniquet time: see anesthesia record  Implants used: Zimmer persona Femur: constrained PS 8 standard Tibia: F with 30 mm cemented stem Patella: 35 mm Polyethylene: 12 mm, constrained   Indication: Jenna Thompson is a 76 y.o. year old female with a history of knee pain. Having failed conservative management, the patient elected to proceed with a total knee arthroplasty.  We have reviewed the risk and benefits of the surgery and they elected to proceed after voicing understanding.  Procedure:  After informed consent was obtained and understanding of the risk were voiced including but not limited to bleeding, infection, damage to surrounding structures including nerves and vessels, blood clots, leg length inequality and the failure to achieve desired results, the operative extremity was marked with verbal confirmation of the patient in the holding area.   The patient was then brought to the operating room and transported to the operating room table in the supine position.  A tourniquet was applied to the operative extremity around the upper thigh. The operative limb was then prepped and draped in the usual sterile fashion and preoperative antibiotics were administered.  A time out was performed prior to the start of surgery confirming the correct extremity, preoperative antibiotic administration, as well as team  members, implants and instruments available for the case. Correct surgical site was also confirmed with preoperative radiographs. The limb was then elevated for exsanguination and the tourniquet was inflated. A midline incision was made and a standard medial parapatellar approach was performed.  The infrapatellar fat pad was removed.  Suprapatellar synovium was removed to reveal the anterior distal femoral cortex.  A medial peel was performed to release the capsule of the medial tibial plateau.  Two large loose bodies were encountered and removed.  The patella was then everted and was prepared and sized to a 35 mm.  A cover was placed on the patella for protection from retractors.  The knee was then brought into full flexion and we then turned our attention to the femur.  The cruciates were sacrificed.  Start site was drilled in the femur and the intramedullary distal femoral cutting guide was placed, set at 5 degrees valgus, taking 12 mm of distal resection due to flexion contracture. The distal cut was made. Osteophytes were then removed.  Next, the proximal tibial cutting guide was placed with appropriate slope, varus/valgus alignment and depth of resection. The proximal tibial cut was made taking 2 mm off the lateral side. Gap blocks were then used to assess the extension gap and alignment, and appropriate soft tissue releases were performed.  Full extension was achieved. Attention was turned back to the femur, which was sized using the sizing guide to a size 8 standard. Appropriate rotation of the femoral component was determined using epicondylar axis, Whiteside's line, and assessing the flexion gap under ligament tension. The appropriate size 4-in-1 cutting block was placed and checked with an angel wing and cuts were made. Posterior femoral osteophytes and uncapped bone were then removed with the curved  osteotome.  The menisci were removed along with the cruciates.  Trial components were placed, and  stability was checked in full extension, mid-flexion, and deep flexion. Proper tibial rotation was determined and marked.  The patella tracked well with a mini lateral release.  The femoral lugs were then drilled and the PS box was prepared.   Trial components were then removed and tibial preparation performed.  The tibia was sized for a size F component.  A large loose body was retrieved from behind the popliteus.   The bony surfaces were irrigated with a pulse lavage and then dried. Bone cement was vacuum mixed on the back table, and the final components sized above were cemented into place.  Antibiotic irrigation was placed in the knee joint and soft tissues while the cement cured.  After cement had finished curing, excess cement was removed. The stability of the construct was re-evaluated throughout a range of motion and found to be acceptable. The trial liner was removed, the knee was copiously irrigated, and the knee was re-evaluated for any excess bone debris. The real polyethylene liner, 12 mm thick, was inserted and checked to ensure the locking mechanism had engaged appropriately. The tourniquet was deflated and hemostasis was achieved. The wound was irrigated with normal saline.  One gram of vancomycin powder was placed in the surgical bed.  Topical 0.25% bupivacaine and meloxicam was placed in the joint for postoperative pain.  Capsular closure was performed with a #1 vicryl, subcutaneous fat closed with a 0 vicryl suture, then subcutaneous tissue closed with interrupted 2.0 vicryl suture. The skin was then closed with a 2.0 nylon and dermabond. A sterile dressing was applied.  The patient was awakened in the operating room and taken to recovery in stable condition. All sponge, needle, and instrument counts were correct at the end of the case.  Tessa Lerner was necessary for opening, closing, retracting, limb positioning and overall facilitation and completion of the surgery.  Position: supine   Complications: none.  Time Out: performed   Drains/Packing: none  Estimated blood loss: minimal  Returned to Recovery Room: in good condition.   Antibiotics: yes   Mechanical VTE (DVT) Prophylaxis: sequential compression devices, TED thigh-high  Chemical VTE (DVT) Prophylaxis: aspirin and plavix  Fluid Replacement  Crystalloid: see anesthesia record Blood: none  FFP: none   Specimens Removed: 1 to pathology   Sponge and Instrument Count Correct? yes   PACU: portable radiograph - knee AP and Lateral   Plan/RTC: Return in 2 weeks for wound check.   Weight Bearing/Load Lower Extremity: full   Implant Name Type Inv. Item Serial No. Manufacturer Lot No. LRB No. Used Action  CEMENT BONE REFOBACIN [id number] [place] - [id number] Cement CEMENT BONE REFOBACIN [id number] [place]  ZIMMER RECON(ORTH,TRAU,BIO,SG) [id number] Left 2 Implanted  STEM TIB ST PERS 14+30 - [id number] Stem STEM TIB ST PERS 14+30  ZIMMER RECON(ORTH,TRAU,BIO,SG) [id number] Left 1 Implanted  STEM POLY PAT PLY 74M KNEE - [id number] Knees STEM POLY PAT PLY 74M KNEE  ZIMMER RECON(ORTH,TRAU,BIO,SG) [id number] Left 1 Implanted  TIBIA STEM 5 DEG SZ F L KNEE - [id number] Knees TIBIA STEM 5 DEG SZ F L KNEE  ZIMMER RECON(ORTH,TRAU,BIO,SG) [id number] Left 1 Implanted  FEMUR  CMT CCR STD SZ8 L KNEE - [id number] Knees FEMUR  CMT CCR STD SZ8 L KNEE  ZIMMER RECON(ORTH,TRAU,BIO,SG) [id number] Left 1 Implanted  LINER TIB KNEE PS EF/6-9 12 LT - [id number] Liner LINER TIB KNEE PS EF/6-9 12 LT  ZIMMER RECON(ORTH,TRAU,BIO,SG) [id number] Left  1 Implanted    N. Eduard Roux, MD [facility] 9:07 AM

## 2022-01-08 NOTE — Anesthesia Postprocedure Evaluation (Signed)
Anesthesia Post Note  Patient: Jenna Thompson  Procedure(s) Performed: LEFT TOTAL KNEE ARTHROPLASTY (Left: Knee)     Patient location during evaluation: PACU Anesthesia Type: Spinal Level of consciousness: oriented and awake and alert Pain management: pain level controlled Vital Signs Assessment: post-procedure vital signs reviewed and stable Respiratory status: spontaneous breathing, respiratory function stable and nonlabored ventilation Cardiovascular status: blood pressure returned to baseline and stable Postop Assessment: no headache, no backache, no apparent nausea or vomiting, spinal receding and patient able to bend at knees Anesthetic complications: no   No notable events documented.  Last Vitals:  Vitals:   01/08/22 1120 01/08/22 1150  BP: 130/68   Pulse: 61 62  Resp: 13 19  Temp:    SpO2: 98% 97%    Last Pain:  Vitals:   01/08/22 0950  TempSrc:   PainSc: 0-No pain                 Ronia Hazelett A.

## 2022-01-09 ENCOUNTER — Encounter (HOSPITAL_COMMUNITY): Payer: Self-pay | Admitting: Orthopaedic Surgery

## 2022-01-09 ENCOUNTER — Other Ambulatory Visit: Payer: Self-pay | Admitting: *Deleted

## 2022-01-09 DIAGNOSIS — M1712 Unilateral primary osteoarthritis, left knee: Secondary | ICD-10-CM | POA: Diagnosis present

## 2022-01-09 DIAGNOSIS — Z888 Allergy status to other drugs, medicaments and biological substances status: Secondary | ICD-10-CM | POA: Diagnosis not present

## 2022-01-09 DIAGNOSIS — I252 Old myocardial infarction: Secondary | ICD-10-CM | POA: Diagnosis not present

## 2022-01-09 DIAGNOSIS — Z7982 Long term (current) use of aspirin: Secondary | ICD-10-CM | POA: Diagnosis not present

## 2022-01-09 DIAGNOSIS — M2100 Valgus deformity, not elsewhere classified, unspecified site: Secondary | ICD-10-CM | POA: Diagnosis present

## 2022-01-09 DIAGNOSIS — I2510 Atherosclerotic heart disease of native coronary artery without angina pectoris: Secondary | ICD-10-CM | POA: Diagnosis present

## 2022-01-09 DIAGNOSIS — Z79899 Other long term (current) drug therapy: Secondary | ICD-10-CM | POA: Diagnosis not present

## 2022-01-09 DIAGNOSIS — I10 Essential (primary) hypertension: Secondary | ICD-10-CM | POA: Diagnosis present

## 2022-01-09 DIAGNOSIS — Z96652 Presence of left artificial knee joint: Secondary | ICD-10-CM

## 2022-01-09 DIAGNOSIS — D62 Acute posthemorrhagic anemia: Secondary | ICD-10-CM | POA: Diagnosis not present

## 2022-01-09 DIAGNOSIS — Z7902 Long term (current) use of antithrombotics/antiplatelets: Secondary | ICD-10-CM | POA: Diagnosis not present

## 2022-01-09 DIAGNOSIS — Z882 Allergy status to sulfonamides status: Secondary | ICD-10-CM | POA: Diagnosis not present

## 2022-01-09 DIAGNOSIS — Z841 Family history of disorders of kidney and ureter: Secondary | ICD-10-CM | POA: Diagnosis not present

## 2022-01-09 LAB — CBC
HCT: 26 % — ABNORMAL LOW (ref 36.0–46.0)
Hemoglobin: 7.8 g/dL — ABNORMAL LOW (ref 12.0–15.0)
MCH: 21.9 pg — ABNORMAL LOW (ref 26.0–34.0)
MCHC: 30 g/dL (ref 30.0–36.0)
MCV: 73 fL — ABNORMAL LOW (ref 80.0–100.0)
Platelets: 236 10*3/uL (ref 150–400)
RBC: 3.56 MIL/uL — ABNORMAL LOW (ref 3.87–5.11)
RDW: 23.2 % — ABNORMAL HIGH (ref 11.5–15.5)
WBC: 8.4 10*3/uL (ref 4.0–10.5)
nRBC: 3.9 % — ABNORMAL HIGH (ref 0.0–0.2)

## 2022-01-09 LAB — PREPARE RBC (CROSSMATCH)

## 2022-01-09 LAB — HEMOGLOBIN AND HEMATOCRIT, BLOOD
HCT: 31 % — ABNORMAL LOW (ref 36.0–46.0)
Hemoglobin: 9.7 g/dL — ABNORMAL LOW (ref 12.0–15.0)

## 2022-01-09 MED ORDER — SODIUM CHLORIDE 0.9% IV SOLUTION: Freq: Once | INTRAVENOUS | Status: AC

## 2022-01-09 MED ORDER — FUROSEMIDE 10 MG/ML IJ SOLN
20.0000 mg | Freq: Once | INTRAMUSCULAR | Status: AC
  Administered 2022-01-09: 20 mg via INTRAVENOUS
  Filled 2022-01-09: qty 2

## 2022-01-09 NOTE — Progress Notes (Signed)
Physical Therapy Treatment Patient Details Name: Jenna Thompson MRN: [id number] DOB: 1945-08-23 Today's Date: 01/09/2022   History of Present Illness Pt is a 76 y/o female s/p L TKA. PMH includes CAD and HTN.    PT Comments    Patient progressing towards physical therapy goals. Patient ambulated 120' with RW and min guard for safety. Initiated stair training with patient able to negotiate 4 stairs with L hand rail and min guard. Encouraged continued mobility with nursing staff. IV team present to place new IV for blood transfusion. D/c plan remains appropriate.     Recommendations for follow up therapy are one component of a multi-disciplinary discharge planning process, led by the attending physician.  Recommendations may be updated based on patient status, additional functional criteria and insurance authorization.  Follow Up Recommendations  Follow physician's recommendations for discharge plan and follow up therapies     Assistance Recommended at Discharge Intermittent Supervision/Assistance  Patient can return home with the following Help with stairs or ramp for entrance;Assist for transportation;Assistance with cooking/housework   Equipment Recommendations  BSC/3in1    Recommendations for Other Services       Precautions / Restrictions Precautions Precautions: Knee Precaution Booklet Issued: No Restrictions Weight Bearing Restrictions: Yes LLE Weight Bearing: Weight bearing as tolerated     Mobility  Bed Mobility Overal bed mobility: Needs Assistance Bed Mobility: Supine to Sit     Supine to sit: Supervision     General bed mobility comments: able to bring B LEs off bed without assistance    Transfers Overall transfer level: Needs assistance Equipment used: Rolling Mayara Paulson (2 wheels) Transfers: Sit to/from Stand Sit to Stand: Supervision           General transfer comment: supervision for safety. Cues for hand placement     Ambulation/Gait Ambulation/Gait assistance: Min guard Gait Distance (Feet): 120 Feet Assistive device: Rolling Blanche Gallien (2 wheels) Gait Pattern/deviations: Step-to pattern, Decreased stride length, Decreased stance time - left Gait velocity: decreased     General Gait Details: min guard for safety. Cues for sequencing and use of RW   Stairs Stairs: Yes Stairs assistance: Min guard Stair Management: One rail Left, Step to pattern, Forwards Number of Stairs: 4 General stair comments: cues for sequencing safe stair negotiation. Min guard for safety.   Wheelchair Mobility    Modified Rankin (Stroke Patients Only)       Balance Overall balance assessment: Needs assistance Sitting-balance support: No upper extremity supported, Feet supported Sitting balance-Leahy Scale: Good     Standing balance support: No upper extremity supported Standing balance-Leahy Scale: Fair Standing balance comment: able to statically stand without UE support                            Cognition Arousal/Alertness: Awake/alert Behavior During Therapy: WFL for tasks assessed/performed Overall Cognitive Status: Within Functional Limits for tasks assessed                                          Exercises      General Comments        Pertinent Vitals/Pain Pain Assessment Pain Assessment: Faces Faces Pain Scale: Hurts little more Pain Location: L knee Pain Descriptors / Indicators: Aching Pain Intervention(s): Monitored during session    Home Living Family/patient expects to be discharged to:: Private residence Living Arrangements: Children  Available Help at Discharge: Family;Available PRN/intermittently (daughter works but is able to help PRN) Type of Home: House Home Access: Level entry     Alternate Level Stairs-Number of Steps: flight Home Layout: Two level Home Equipment: Animator (2 wheels)      Prior Function             PT Goals (current goals can now be found in the care plan section) Acute Rehab PT Goals Patient Stated Goal: to go home PT Goal Formulation: With patient Time For Goal Achievement: 01/22/22 Potential to Achieve Goals: Good Progress towards PT goals: Progressing toward goals    Frequency    7X/week      PT Plan Current plan remains appropriate    Co-evaluation              AM-PAC PT "6 Clicks" Mobility   Outcome Measure  Help needed turning from your back to your side while in a flat bed without using bedrails?: A Little Help needed moving from lying on your back to sitting on the side of a flat bed without using bedrails?: A Little Help needed moving to and from a bed to a chair (including a wheelchair)?: A Little Help needed standing up from a chair using your arms (e.g., wheelchair or bedside chair)?: A Little Help needed to walk in hospital room?: A Little Help needed climbing 3-5 steps with a railing? : A Little 6 Click Score: 18    End of Session Equipment Utilized During Treatment: Gait belt Activity Tolerance: Patient tolerated treatment well Patient left: in chair;with call bell/phone within reach Nurse Communication: Mobility status PT Visit Diagnosis: Unsteadiness on feet (R26.81);Muscle weakness (generalized) (M62.81)     Time: 7628-3151 PT Time Calculation (min) (ACUTE ONLY): 21 min  Charges:  $Gait Training: 8-22 mins                     Kostantinos Tallman A. Gilford Rile PT, DPT Acute Rehabilitation Services Office [phone]    Linna Hoff 01/09/2022, 12:04 PM

## 2022-01-09 NOTE — Progress Notes (Signed)
Subjective: 1 Day Post-Op Procedure(s) (LRB): LEFT TOTAL KNEE ARTHROPLASTY (Left) Patient reports pain as mild.    Objective: Vital signs in last 24 hours: Temp:  [97.6 F (36.4 C)-98 F (36.7 C)] 97.7 F (36.5 C) (09/27 0348) Pulse Rate:  [59-67] 64 (09/27 0348) Resp:  [11-19] 16 (09/27 0348) BP: (107-133)/(63-77) 133/66 (09/27 0348) SpO2:  [95 %-100 %] 100 % (09/27 0348)  Intake/Output from previous day: 09/26 0701 - 09/27 0700 In: 1100 [I.V.:800; IV Piggyback:300] Out: 875 [Urine:800; Blood:75] Intake/Output this shift: No intake/output data recorded.  Recent Labs    01/09/22 0518  HGB 7.8*   Recent Labs    01/09/22 0518  WBC 8.4  RBC 3.56*  HCT 26.0*  PLT 236   No results for input(s): "NA", "K", "CL", "CO2", "BUN", "CREATININE", "GLUCOSE", "CALCIUM" in the last 72 hours. No results for input(s): "LABPT", "INR" in the last 72 hours.  Neurologically intact Neurovascular intact Sensation intact distally Intact pulses distally Dorsiflexion/Plantar flexion intact Incision: scant drainage No cellulitis present Compartment soft   Assessment/Plan: 1 Day Post-Op Procedure(s) (LRB): LEFT TOTAL KNEE ARTHROPLASTY (Left) Advance diet Up with therapy Plan for discharge tomorrow once cleared by PT WBAT LLE ABLA on chronic iron deficiency anemia- will transfuse today    Anticipated LOS equal to or greater than 2 midnights due to - Age 76 and older with one or more of the following:  - Obesity  - Expected need for hospital services (PT, OT, Nursing) required for safe  discharge  - Anticipated need for postoperative skilled nursing care or inpatient rehab  - Active co-morbidities: Heart Attack and Anemia OR   - Unanticipated findings during/Post Surgery: Slow post-op progression: GI, pain control, mobility  - Patient is a high risk of re-admission due to: None   Aundra Dubin 01/09/2022, 7:37 AM

## 2022-01-09 NOTE — TOC Initial Note (Signed)
Transition of Care [facility]) - Initial/Assessment Note    Patient Details  Name: Jenna Thompson MRN: [id number] Date of Birth: Aug 14, 1945  Transition of Care [facility]) CM/SW Contact:    Epifanio Lesches, RN Phone Number: 01/09/2022, 10:28 AM  Clinical Narrative:                 S/p LEFT TOTAL KNEE ARTHROPLASTY, 9/26 Per provider pt will probably d/c to home on tomorrow. Pt is from home with daughter. PTA independent with ADL's, no DME usage. Pt agreeable to home health services. Home health services were setup by provider prior to surgery with [facility]. Pt without DME needs. Already has  CPM and RW @ home. Daughter to assist with care once d/c.   Daughter to provide transportation to home.  TOC  team following and will assist with needs...  Expected Discharge Plan: Home w Home Health Services Barriers to Discharge: Continued Medical Work up   Patient Goals and CMS Choice     Choice offered to / list presented to : Patient  Expected Discharge Plan and Services Expected Discharge Plan: Home w Home Health Services   Discharge Planning Services: CM Consult                               HH Arranged: PT [facility] Agency: [facility] Date [facility] Agency Contacted: 01/09/22 Time HH Agency Contacted: [phone]    Prior Living Arrangements/Services     Patient language and need for interpreter reviewed:: Yes Do you feel safe going back to the place where you live?: Yes      Need for Family Participation in Patient Care: Yes (Comment) Care giver support system in place?: Yes (comment)   Criminal Activity/Legal Involvement Pertinent to Current Situation/Hospitalization: No - Comment as needed  Activities of Daily Living Home Assistive Devices/Equipment: Walker (specify type) ADL Screening (condition at time of admission) Patient's cognitive ability adequate to safely complete daily activities?: Yes Is the patient deaf or have difficulty hearing?: No Does the  patient have difficulty seeing, even when wearing glasses/contacts?: No Does the patient have difficulty concentrating, remembering, or making decisions?: No Patient able to express need for assistance with ADLs?: Yes Does the patient have difficulty dressing or bathing?: Yes Independently performs ADLs?: No Does the patient have difficulty walking or climbing stairs?: Yes Weakness of Legs: Left Weakness of Arms/Hands: None  Permission Sought/Granted                  Emotional Assessment Appearance:: Appears stated age Attitude/Demeanor/Rapport: Gracious Affect (typically observed): Pleasant Orientation: : Oriented to Self, Oriented to Place, Oriented to  Time, Oriented to Situation Alcohol / Substance Use: Not Applicable Psych Involvement: No (comment)  Admission diagnosis:  Status post total left knee replacement [Z96.652] Patient Active Problem List   Diagnosis Date Noted   Status post total left knee replacement 01/08/2022   Primary osteoarthritis of right knee 01/14/2020   Primary osteoarthritis of right hip 01/14/2020   Primary osteoarthritis of left knee 01/14/2020   Hyperlipidemia 05/05/2017   S/P PTCA (percutaneous transluminal coronary angioplasty)    Iron deficiency anemia    Acute myocardial infarction [facility])    Primary osteoarthritis of both knees 12/20/2016   Personal history of colonic polyps 12/26/2010   PCP:  Deatra James, MD Pharmacy:   [id number] - [place], [place] - [address]  [place] [address] Phone: [phone] Fax: [phone]  [facility] ([place], [place] [facility] [address] [place] [address] Phone: [phone] Fax: [phone]  [facility] ([facility]) - [place], [place] [address] Phone: [phone] Fax:  [phone]     Social Determinants of Health (SDOH) Interventions    Readmission Risk Interventions     No data to display

## 2022-01-09 NOTE — Evaluation (Signed)
Occupational Therapy Evaluation Patient Details Name: Jenna Thompson MRN: [id number] DOB: [birth year] Today's Date: 01/09/2022   History of Present Illness Pt is a 76 y/o female s/p L TKA. PMH includes CAD and HTN.   Clinical Impression   Pt in recliner upon therapy arrival and agreeable to participate in OT evaluation. Pt reports mild pain level and premedicated prior to session. Due to recent knee surgery, pt requires modifications and/or compensatory techniques to complete BADL tasks. Provided patient education on AE to allow patient to perform required ADL tasks such as bathing and dressing. Pt able to either verbalize or demonstrate understanding of all education. Handout provided on recommended AE discussed and pt will inform family member in order to purchase. No further OT needs are recommended at this time. All education complete and questions answered.      Recommendations for follow up therapy are one component of a multi-disciplinary discharge planning process, led by the attending physician.  Recommendations may be updated based on patient status, additional functional criteria and insurance authorization.   Follow Up Recommendations  No OT follow up    Assistance Recommended at Discharge PRN  Patient can return home with the following Assist for transportation;Help with stairs or ramp for entrance    Functional Status Assessment  Patient has had a recent decline in their functional status and demonstrates the ability to make significant improvements in function in a reasonable and predictable amount of time.  Equipment Recommendations  Tub/shower seat;Other (comment) (long handled sponge, reacher, sock aid. Provided handout with information.)       Precautions / Restrictions Precautions Precautions: Knee Precaution Booklet Issued: No Restrictions Weight Bearing Restrictions: Yes LLE Weight Bearing: Weight bearing as tolerated      Mobility Bed Mobility Overal bed  mobility:  (pt in recliner upon therapy arrival)       Patient Response: Cooperative  Transfers Overall transfer level: Needs assistance Equipment used: Rolling walker (2 wheels) Transfers: Sit to/from Stand, Bed to chair/wheelchair/BSC Sit to Stand: Supervision     Step pivot transfers: Supervision     General transfer comment: Tub/shower transfer completed using RW and shower chair with back. Provided Min A for safety using forward step over technique using RLE. SBA provided to transfer out of tub/shower using same technique with SBA.      Balance Overall balance assessment: Mild deficits observed, not formally tested (due to recent surgery. pt able to maintain balance for all functional mobility performed in room and hallway. When completing higher level balance tasks; such as tub/shower transfer, pt provided with MinA/CGA for safety.)         ADL either performed or assessed with clinical judgement   ADL Overall ADL's : Modified independent       General ADL Comments: After education provided on use of AE such as reacher, sock aid, and LHS pt will be mod i for BADL. Will need assistance to don stockings for church.     Vision Baseline Vision/History: 0 No visual deficits Ability to See in Adequate Light: 0 Adequate Patient Visual Report: No change from baseline Vision Assessment?: No apparent visual deficits     Perception Perception Perception: Not tested   Praxis Praxis Praxis: Not tested    Pertinent Vitals/Pain Pain Assessment Pain Assessment: 0-10 Pain Score: 5  Pain Location: L knee Pain Descriptors / Indicators: Aching Pain Intervention(s): Monitored during session, Premedicated before session     Hand Dominance Right   Extremity/Trunk Assessment Upper Extremity Assessment Upper  Extremity Assessment: Overall WFL for tasks assessed   Lower Extremity Assessment Lower Extremity Assessment: Defer to PT evaluation       Communication  Communication Communication: No difficulties   Cognition Arousal/Alertness: Awake/alert Behavior During Therapy: WFL for tasks assessed/performed Overall Cognitive Status: Within Functional Limits for tasks assessed                  Home Living Family/patient expects to be discharged to:: Private residence Living Arrangements: Children Available Help at Discharge: Family;Available PRN/intermittently (daughter works but is able to help PRN) Type of Home: House Home Access: Level entry     Home Layout: Two level Alternate Level Stairs-Number of Steps: flight Alternate Level Stairs-Rails: Left Bathroom Shower/Tub: Teacher, early years/pre: Standard Bathroom Accessibility: Yes How Accessible: Accessible via walker Home Equipment: Animator (2 wheels)          Prior Functioning/Environment Prior Level of Function : Independent/Modified Independent          OT Problem List: Decreased strength;Decreased knowledge of precautions;Pain         OT Goals(Current goals can be found in the care plan section) Acute Rehab OT Goals OT Goal Formulation: All assessment and education complete, DC therapy  OT Frequency:  Eval only       AM-PAC OT "6 Clicks" Daily Activity     Outcome Measure Help from another person eating meals?: None Help from another person taking care of personal grooming?: None Help from another person toileting, which includes using toliet, bedpan, or urinal?: None Help from another person bathing (including washing, rinsing, drying)?: A Little Help from another person to put on and taking off regular upper body clothing?: None Help from another person to put on and taking off regular lower body clothing?: A Little 6 Click Score: 22   End of Session Equipment Utilized During Treatment: Gait belt;Rolling walker (2 wheels)  Activity Tolerance: Patient tolerated treatment well Patient left: in chair;with call bell/phone within  reach  OT Visit Diagnosis: Muscle weakness (generalized) (M62.81);Pain Pain - Right/Left: Left Pain - part of body: Knee                Time: 2836-6294 OT Time Calculation (min): 26 min Charges:  OT General Charges $OT Visit: 1 Visit OT Evaluation $OT Eval Low Complexity: 1 Low  Ailene Ravel, OTR/L,CBIS  Supplemental OT - [facility] and [facility]   Callum Wolf, Clarene Duke 01/09/2022, 10:46 AM

## 2022-01-09 NOTE — Progress Notes (Signed)
Physical Therapy Treatment Patient Details Name: KHRISTIN SLATE MRN: [id number] DOB: February 22, 1946 Today's Date: 01/09/2022   History of Present Illness Pt is a 76 y/o female s/p L TKA. PMH includes CAD and HTN.    PT Comments    Patient making steady progress towards physical therapy goals. Ambulating extended hallway distance with supervision and use of RW. Able to negotiate 6 stairs with L hand rail and supervision. Participated in therex while sitting in recliner and long sitting focused on LE strengthening. D/c plan remains appropriate.     Recommendations for follow up therapy are one component of a multi-disciplinary discharge planning process, led by the attending physician.  Recommendations may be updated based on patient status, additional functional criteria and insurance authorization.  Follow Up Recommendations  Follow physician's recommendations for discharge plan and follow up therapies     Assistance Recommended at Discharge Intermittent Supervision/Assistance  Patient can return home with the following Help with stairs or ramp for entrance;Assist for transportation;Assistance with cooking/housework   Equipment Recommendations  BSC/3in1    Recommendations for Other Services       Precautions / Restrictions Precautions Precautions: Knee Precaution Booklet Issued: No Restrictions Weight Bearing Restrictions: Yes LLE Weight Bearing: Weight bearing as tolerated     Mobility  Bed Mobility Overal bed mobility: Needs Assistance Bed Mobility: Supine to Sit     Supine to sit: Supervision     General bed mobility comments: in recliner on arrival    Transfers Overall transfer level: Needs assistance Equipment used: Rolling Mee Macdonnell (2 wheels) Transfers: Sit to/from Stand Sit to Stand: Supervision           General transfer comment: supervision for safety    Ambulation/Gait Ambulation/Gait assistance: Supervision Gait Distance (Feet): 250 Feet Assistive  device: Rolling Arlette Schaad (2 wheels) Gait Pattern/deviations: Step-to pattern, Decreased stride length, Decreased stance time - left Gait velocity: decreased     General Gait Details: supervision for safety. Good recall of sequencing and proximity of RW   Stairs Stairs: Yes Stairs assistance: Supervision Stair Management: One rail Left, Step to pattern, Forwards Number of Stairs: 6 General stair comments: good recall of stair sequencing. Supervision for safety   Wheelchair Mobility    Modified Rankin (Stroke Patients Only)       Balance Overall balance assessment: Needs assistance Sitting-balance support: No upper extremity supported, Feet supported Sitting balance-Leahy Scale: Good     Standing balance support: No upper extremity supported Standing balance-Leahy Scale: Fair Standing balance comment: able to statically stand without UE support                            Cognition Arousal/Alertness: Awake/alert Behavior During Therapy: WFL for tasks assessed/performed Overall Cognitive Status: Within Functional Limits for tasks assessed                                          Exercises Total Joint Exercises Heel Slides: Left, 10 reps (long sitting) Hip ABduction/ADduction: Left, 10 reps (long sitting) Straight Leg Raises: Left, 10 reps (long sitting) Long Arc Quad: Left, 10 reps, Seated Marching in Standing: Both, 10 reps, Seated    General Comments        Pertinent Vitals/Pain Pain Assessment Pain Assessment: Faces Faces Pain Scale: Hurts a little bit Pain Location: L knee Pain Descriptors / Indicators: Aching Pain Intervention(s):  Monitored during session    Home Living                          Prior Function            PT Goals (current goals can now be found in the care plan section) Acute Rehab PT Goals Patient Stated Goal: to go home PT Goal Formulation: With patient Time For Goal Achievement:  01/22/22 Potential to Achieve Goals: Good Progress towards PT goals: Progressing toward goals    Frequency    7X/week      PT Plan Current plan remains appropriate    Co-evaluation              AM-PAC PT "6 Clicks" Mobility   Outcome Measure  Help needed turning from your back to your side while in a flat bed without using bedrails?: A Little Help needed moving from lying on your back to sitting on the side of a flat bed without using bedrails?: A Little Help needed moving to and from a bed to a chair (including a wheelchair)?: A Little Help needed standing up from a chair using your arms (e.g., wheelchair or bedside chair)?: A Little Help needed to walk in hospital room?: A Little Help needed climbing 3-5 steps with a railing? : A Little 6 Click Score: 18    End of Session   Activity Tolerance: Patient tolerated treatment well Patient left: in chair;with call bell/phone within reach Nurse Communication: Mobility status PT Visit Diagnosis: Unsteadiness on feet (R26.81);Muscle weakness (generalized) (M62.81)     Time: 9379-0240 PT Time Calculation (min) (ACUTE ONLY): 25 min  Charges:  $Therapeutic Exercise: 8-22 mins $Therapeutic Activity: 8-22 mins                     Brendalyn Vallely A. Gilford Rile PT, DPT Acute Rehabilitation Services Office [phone]    Linna Hoff 01/09/2022, 5:04 PM

## 2022-01-09 NOTE — Care Management Obs Status (Signed)
[place] NOTIFICATION   Patient Details  Name: CIANA SIMMON MRN: [id number] Date of Birth: Dec 13, 1945   Medicare Observation Status Notification Given:  Yes    Sharin Mons, RN 01/09/2022, 10:25 AM

## 2022-01-10 LAB — TYPE AND SCREEN
ABO/RH(D): A POS
Antibody Screen: NEGATIVE
Unit division: 0

## 2022-01-10 LAB — BPAM RBC
Blood Product Expiration Date: 202310202359
ISSUE DATE / TIME: 202309271130
Unit Type and Rh: 6200

## 2022-01-10 NOTE — Progress Notes (Signed)
Physical Therapy Treatment Patient Details Name: Jenna Thompson MRN: [id number] DOB: 01-19-1946 Today's Date: 01/10/2022   History of Present Illness Pt is a 76 y/o female s/p L TKA. PMH includes CAD and HTN.    PT Comments    Patient limited by dizziness and nausea this session but continues to ambulate at min guard-supervision level with RW. Able to recall safe stair sequencing. Encouraged continued mobility at home. D/c plan remains appropriate.     Recommendations for follow up therapy are one component of a multi-disciplinary discharge planning process, led by the attending physician.  Recommendations may be updated based on patient status, additional functional criteria and insurance authorization.  Follow Up Recommendations  Follow physician's recommendations for discharge plan and follow up therapies     Assistance Recommended at Discharge Intermittent Supervision/Assistance  Patient can return home with the following Help with stairs or ramp for entrance;Assist for transportation;Assistance with cooking/housework   Equipment Recommendations  BSC/3in1    Recommendations for Other Services       Precautions / Restrictions Precautions Precautions: Knee Precaution Booklet Issued: No Restrictions Weight Bearing Restrictions: Yes LLE Weight Bearing: Weight bearing as tolerated     Mobility  Bed Mobility               General bed mobility comments: in recliner on arrival    Transfers Overall transfer level: Needs assistance Equipment used: Rolling Jasaun Carn (2 wheels) Transfers: Sit to/from Stand Sit to Stand: Supervision           General transfer comment: increased time to complete this date due to pain    Ambulation/Gait Ambulation/Gait assistance: Min guard Gait Distance (Feet): 30 Feet Assistive device: Rolling Koichi Platte (2 wheels) Gait Pattern/deviations: Step-to pattern, Decreased stride length, Decreased stance time - left Gait velocity:  decreased     General Gait Details: min guard for safety as patient complaining of dizziness and nausea. Returned to room. Patient reporting not eating this AM prior to medication.   Stairs         General stair comments: verbally discussed stair negotiation with patient able to recall safe stair sequencing.   Wheelchair Mobility    Modified Rankin (Stroke Patients Only)       Balance Overall balance assessment: Needs assistance Sitting-balance support: No upper extremity supported, Feet supported Sitting balance-Leahy Scale: Good     Standing balance support: No upper extremity supported Standing balance-Leahy Scale: Fair Standing balance comment: able to statically stand without UE support                            Cognition Arousal/Alertness: Awake/alert Behavior During Therapy: WFL for tasks assessed/performed Overall Cognitive Status: Within Functional Limits for tasks assessed                                          Exercises      General Comments        Pertinent Vitals/Pain Pain Assessment Pain Assessment: Faces Faces Pain Scale: Hurts even more Pain Location: L knee Pain Descriptors / Indicators: Aching, Sore Pain Intervention(s): Monitored during session    Home Living                          Prior Function  PT Goals (current goals can now be found in the care plan section) Acute Rehab PT Goals Patient Stated Goal: to go home PT Goal Formulation: With patient Time For Goal Achievement: 01/22/22 Potential to Achieve Goals: Good Progress towards PT goals: Progressing toward goals    Frequency    7X/week      PT Plan Current plan remains appropriate    Co-evaluation              AM-PAC PT "6 Clicks" Mobility   Outcome Measure  Help needed turning from your back to your side while in a flat bed without using bedrails?: A Little Help needed moving from lying on your back  to sitting on the side of a flat bed without using bedrails?: A Little Help needed moving to and from a bed to a chair (including a wheelchair)?: A Little Help needed standing up from a chair using your arms (e.g., wheelchair or bedside chair)?: A Little Help needed to walk in hospital room?: A Little Help needed climbing 3-5 steps with a railing? : A Little 6 Click Score: 18    End of Session   Activity Tolerance: Patient tolerated treatment well Patient left: in chair;with call bell/phone within reach Nurse Communication: Mobility status PT Visit Diagnosis: Unsteadiness on feet (R26.81);Muscle weakness (generalized) (M62.81)     Time: 6734-1937 PT Time Calculation (min) (ACUTE ONLY): 17 min  Charges:  $Therapeutic Activity: 8-22 mins                     Pacen Watford A. Dan Humphreys PT, DPT Acute Rehabilitation Services Office [phone]    Jenna Thompson 01/10/2022, 10:53 AM

## 2022-01-10 NOTE — Discharge Summary (Signed)
Patient ID: Jenna Thompson MRN: [id number] DOB/AGE: [birth year] 76 y.o.  Admit date: 01/08/2022 Discharge date: 01/10/2022  Admission Diagnoses:  Principal Problem:   Primary osteoarthritis of left knee Active Problems:   Status post total left knee replacement   Primary localized osteoarthritis of left knee   Discharge Diagnoses:  Same  Past Medical History:  Diagnosis Date   Anemia, iron deficiency    Arthritis    L>R knee   Atrophic gastritis without mention of hemorrhage    CAD (coronary artery disease)    a. s/p Inferior STEMI in 04/2017 with angioplasty and thrombectomy to mid-RCA, repeat cath later that admission with aspiration thrombectomy of rPDA and residual thrombus along distal vessel too small for intervention   Colon polyps    Diaphragmatic hernia without mention of obstruction or gangrene    Diverticulosis of colon (without mention of hemorrhage)    Hiatal hernia    Hypertension    Unspecified hemorrhoids without mention of complication     Surgeries: Procedure(s): LEFT TOTAL KNEE ARTHROPLASTY on 01/08/2022   Consultants:   Discharged Condition: Improved  Hospital Course: Jenna Thompson is an 76 y.o. female who was admitted 01/08/2022 for operative treatment ofPrimary osteoarthritis of left knee. Patient has severe unremitting pain that affects sleep, daily activities, and work/hobbies. After pre-op clearance the patient was taken to the operating room on 01/08/2022 and underwent  Procedure(s): LEFT TOTAL KNEE ARTHROPLASTY.  Patient developed ABLA on top of iron deficiency anemia.  Transfused with one unit prbc pod #2.  Doing great now.  Patient was given perioperative antibiotics:  Anti-infectives (From admission, onward)    Start     Dose/Rate Route Frequency Ordered Stop   01/08/22 1330  ceFAZolin (ANCEF) IVPB 2g/100 mL premix        2 g 200 mL/hr over 30 Minutes Intravenous Every 6 hours 01/08/22 1238 01/08/22 2212   01/08/22 0759  vancomycin (VANCOCIN)  powder  Status:  Discontinued          As needed 01/08/22 0759 01/08/22 0947   01/08/22 0600  ceFAZolin (ANCEF) IVPB 2g/100 mL premix        2 g 200 mL/hr over 30 Minutes Intravenous On call to O.R. 01/08/22 0544 01/08/22 0745        Patient was given sequential compression devices, early ambulation, and chemoprophylaxis to prevent DVT.  Patient benefited maximally from hospital stay and there were no complications.    Recent vital signs: Patient Vitals for the past 24 hrs:  BP Temp Temp src Pulse Resp SpO2  01/10/22 0746 130/84 98.5 F (36.9 C) Oral 78 -- 99 %  01/10/22 0536 139/82 98.2 F (36.8 C) Oral 65 -- 100 %  01/10/22 0013 (!) 144/87 98 F (36.7 C) Oral 73 20 100 %  01/09/22 2033 139/74 98 F (36.7 C) Oral 77 -- 99 %  01/09/22 1508 135/71 97.7 F (36.5 C) Oral 63 -- 98 %  01/09/22 1427 123/68 98.1 F (36.7 C) Oral 64 18 97 %  01/09/22 1154 131/64 98.2 F (36.8 C) Oral 63 15 100 %  01/09/22 1149 118/60 98.2 F (36.8 C) Oral 62 16 98 %  01/09/22 1144 (!) 122/59 98.2 F (36.8 C) Oral 62 14 100 %  01/09/22 1139 121/67 98.2 F (36.8 C) Oral 62 18 100 %  01/09/22 1130 123/65 98.4 F (36.9 C) Oral 63 14 100 %  01/09/22 1047 121/67 98.4 F (36.9 C) Oral 62 14 100 %  01/09/22 0821 Marland Kitchen)  142/77 (!) 97.5 F (36.4 C) Oral 67 -- 100 %     Recent laboratory studies:  Recent Labs    01/09/22 0518 01/09/22 1701  WBC 8.4  --   HGB 7.8* 9.7*  HCT 26.0* 31.0*  PLT 236  --      Discharge Medications:   Allergies as of 01/10/2022       Reactions   Macrobid [nitrofurantoin Monohyd Macro] Nausea And Vomiting, Other (See Comments)   Stomach pain   Sulfa Antibiotics Nausea And Vomiting, Other (See Comments)   Lethargic, tired        Medication List     STOP taking these medications    acetaminophen 650 MG CR tablet Commonly known as: TYLENOL       TAKE these medications    aspirin 81 MG chewable tablet Chew 1 tablet (81 mg total) by mouth daily.    atorvastatin 80 MG tablet Commonly known as: LIPITOR TAKE 1 TABLET BY MOUTH  DAILY   Calcium Carbonate Antacid 1177 MG Chew Chew 1,177 mg by mouth as needed (as needed for heartburn).   clopidogrel 75 MG tablet Commonly known as: PLAVIX TAKE 1 TABLET BY MOUTH  DAILY   cyanocobalamin 1000 MCG tablet Commonly known as: VITAMIN B12 Take 1,000 mcg by mouth daily.   docusate sodium 100 MG capsule Commonly known as: Colace Take 1 capsule (100 mg total) by mouth daily as needed.   methocarbamol 750 MG tablet Commonly known as: Robaxin-750 Take 1 tablet (750 mg total) by mouth 2 (two) times daily as needed for muscle spasms.   metoprolol tartrate 25 MG tablet Commonly known as: LOPRESSOR TAKE 1 TABLET BY MOUTH  TWICE DAILY   multivitamin with minerals tablet Take 1 tablet by mouth daily.   nitroGLYCERIN 0.4 MG SL tablet Commonly known as: NITROSTAT DISSOLVE 1 TABLET UNDER THE  TONGUE EVERY 5 MINUTES AS NEEDED FOR CHEST PAIN. MAX OF 3 TABLETS IN 15 MINUTES. CALL 911 IF PAIN  PERSISTS.   ondansetron 4 MG tablet Commonly known as: Zofran Take 1 tablet (4 mg total) by mouth every 8 (eight) hours as needed for nausea or vomiting.   oxyCODONE-acetaminophen 5-325 MG tablet Commonly known as: Percocet Take 1-2 tablets by mouth every 6 (six) hours as needed. To be taken after surgery   Pfizer-BioNT COVID-19 Vac-TriS Susp injection Generic drug: COVID-19 mRNA Vac-TriS (Pfizer) Inject into the muscle.   timolol 0.5 % ophthalmic solution Commonly known as: TIMOPTIC Place 1 drop into both eyes daily.               Durable Medical Equipment  (From admission, onward)           Start     Ordered   01/08/22 1239  DME Walker rolling  Once       Question Answer Comment  Walker: With 5 Inch Wheels   Patient needs a walker to treat with the following condition Status post left partial knee replacement      01/08/22 1238   01/08/22 1239  DME 3 n 1  Once        01/08/22  1238   01/08/22 1239  DME Bedside commode  Once       Question:  Patient needs a bedside commode to treat with the following condition  Answer:  Status post left partial knee replacement   01/08/22 1238            Diagnostic Studies: DG Knee Left Port  Result  Date: 01/08/2022 CLINICAL DATA:  Postop EXAM: PORTABLE LEFT KNEE - 2 VIEW COMPARISON:  Knee radiograph 11/20/2021 FINDINGS: Interval postsurgical changes from total knee arthroplasty. There is air within the joint as well as small volume subcutaneous emphysema, favored to be postsurgical. No evidence of fracture. Normal alignment. Enthesopathic changes of the patella. IMPRESSION: Expected postsurgical changes after total knee arthroplasty. Electronically Signed   By: Marin Roberts M.D.   On: 01/08/2022 11:04    Disposition: Discharge disposition: 01-Home or Self Care          Follow-up Information     Leandrew Koyanagi, MD. Schedule an appointment as soon as possible for a visit in 2 week(s).   Specialty: Orthopedic Surgery Contact information: [place] [address] [phone]         [facility] Follow up.   Specialty: Home Health Services Why: Home health services will be provided by [facility] information: [address] [place] [address] [phone]                  Signed: Aundra Dubin 01/10/2022, 7:59 AM

## 2022-01-10 NOTE — Progress Notes (Addendum)
Subjective: 2 Days Post-Op Procedure(s) (LRB): LEFT TOTAL KNEE ARTHROPLASTY (Left) Patient reports pain as mild.  Doing great with PT  Objective: Vital signs in last 24 hours: Temp:  [97.5 F (36.4 C)-98.5 F (36.9 C)] 98.5 F (36.9 C) (09/28 0746) Pulse Rate:  [62-78] 78 (09/28 0746) Resp:  [14-20] 20 (09/28 0013) BP: (118-144)/(59-87) 130/84 (09/28 0746) SpO2:  [97 %-100 %] 99 % (09/28 0746)  Intake/Output from previous day: 09/27 0701 - 09/28 0700 In: 605 [P.O.:240; I.V.:50; Blood:315] Out: -  Intake/Output this shift: No intake/output data recorded.  Recent Labs    01/09/22 0518 01/09/22 1701  HGB 7.8* 9.7*   Recent Labs    01/09/22 0518 01/09/22 1701  WBC 8.4  --   RBC 3.56*  --   HCT 26.0* 31.0*  PLT 236  --    No results for input(s): "NA", "K", "CL", "CO2", "BUN", "CREATININE", "GLUCOSE", "CALCIUM" in the last 72 hours. No results for input(s): "LABPT", "INR" in the last 72 hours.  Neurologically intact Neurovascular intact Sensation intact distally Intact pulses distally Dorsiflexion/Plantar flexion intact Incision: scant drainage No cellulitis present Compartment soft   Assessment/Plan: 2 Days Post-Op Procedure(s) (LRB): LEFT TOTAL KNEE ARTHROPLASTY (Left) Advance diet Up with therapy D/C IV fluids Discharge home with home health after first PT session WBAT LLE ABLA on chronic anemia- transfused with one unit prbc yesterday.  Hemoglobin now at 9.7 which is baseline.  Currently asymptomatic Nurse to change aquacel    Anticipated LOS equal to or greater than 2 midnights due to - Age 12 and older with one or more of the following:  - Obesity  - Expected need for hospital services (PT, OT, Nursing) required for safe  discharge  - Anticipated need for postoperative skilled nursing care or inpatient rehab  - Active co-morbidities: Heart Attack and Anemia OR   - Unanticipated findings during/Post Surgery: Slow post-op progression: GI, pain  control, mobility  - Patient is a high risk of re-admission due to: None   Aundra Dubin 01/10/2022, 7:57 AM

## 2022-01-11 ENCOUNTER — Telehealth: Payer: Self-pay | Admitting: *Deleted

## 2022-01-11 NOTE — Telephone Encounter (Signed)
Ortho bundle D/C call completed. 

## 2022-01-12 DIAGNOSIS — Z9049 Acquired absence of other specified parts of digestive tract: Secondary | ICD-10-CM | POA: Diagnosis not present

## 2022-01-12 DIAGNOSIS — Z9181 History of falling: Secondary | ICD-10-CM | POA: Diagnosis not present

## 2022-01-12 DIAGNOSIS — H409 Unspecified glaucoma: Secondary | ICD-10-CM | POA: Diagnosis not present

## 2022-01-12 DIAGNOSIS — I10 Essential (primary) hypertension: Secondary | ICD-10-CM | POA: Diagnosis not present

## 2022-01-12 DIAGNOSIS — Z86010 Personal history of colonic polyps: Secondary | ICD-10-CM | POA: Diagnosis not present

## 2022-01-12 DIAGNOSIS — Z471 Aftercare following joint replacement surgery: Secondary | ICD-10-CM | POA: Diagnosis not present

## 2022-01-12 DIAGNOSIS — I252 Old myocardial infarction: Secondary | ICD-10-CM | POA: Diagnosis not present

## 2022-01-12 DIAGNOSIS — K449 Diaphragmatic hernia without obstruction or gangrene: Secondary | ICD-10-CM | POA: Diagnosis not present

## 2022-01-12 DIAGNOSIS — Z9861 Coronary angioplasty status: Secondary | ICD-10-CM | POA: Diagnosis not present

## 2022-01-12 DIAGNOSIS — E785 Hyperlipidemia, unspecified: Secondary | ICD-10-CM | POA: Diagnosis not present

## 2022-01-12 DIAGNOSIS — Z96652 Presence of left artificial knee joint: Secondary | ICD-10-CM | POA: Diagnosis not present

## 2022-01-12 DIAGNOSIS — Z87891 Personal history of nicotine dependence: Secondary | ICD-10-CM | POA: Diagnosis not present

## 2022-01-12 DIAGNOSIS — M1711 Unilateral primary osteoarthritis, right knee: Secondary | ICD-10-CM | POA: Diagnosis not present

## 2022-01-12 DIAGNOSIS — K5900 Constipation, unspecified: Secondary | ICD-10-CM | POA: Diagnosis not present

## 2022-01-12 DIAGNOSIS — D509 Iron deficiency anemia, unspecified: Secondary | ICD-10-CM | POA: Diagnosis not present

## 2022-01-12 DIAGNOSIS — K5730 Diverticulosis of large intestine without perforation or abscess without bleeding: Secondary | ICD-10-CM | POA: Diagnosis not present

## 2022-01-12 DIAGNOSIS — M1611 Unilateral primary osteoarthritis, right hip: Secondary | ICD-10-CM | POA: Diagnosis not present

## 2022-01-12 DIAGNOSIS — H9191 Unspecified hearing loss, right ear: Secondary | ICD-10-CM | POA: Diagnosis not present

## 2022-01-12 DIAGNOSIS — Z7982 Long term (current) use of aspirin: Secondary | ICD-10-CM | POA: Diagnosis not present

## 2022-01-12 DIAGNOSIS — I2510 Atherosclerotic heart disease of native coronary artery without angina pectoris: Secondary | ICD-10-CM | POA: Diagnosis not present

## 2022-01-12 DIAGNOSIS — Z7902 Long term (current) use of antithrombotics/antiplatelets: Secondary | ICD-10-CM | POA: Diagnosis not present

## 2022-01-14 DIAGNOSIS — Z96652 Presence of left artificial knee joint: Secondary | ICD-10-CM | POA: Diagnosis not present

## 2022-01-14 DIAGNOSIS — I2510 Atherosclerotic heart disease of native coronary artery without angina pectoris: Secondary | ICD-10-CM | POA: Diagnosis not present

## 2022-01-14 DIAGNOSIS — M1611 Unilateral primary osteoarthritis, right hip: Secondary | ICD-10-CM | POA: Diagnosis not present

## 2022-01-14 DIAGNOSIS — Z9861 Coronary angioplasty status: Secondary | ICD-10-CM | POA: Diagnosis not present

## 2022-01-14 DIAGNOSIS — Z7982 Long term (current) use of aspirin: Secondary | ICD-10-CM | POA: Diagnosis not present

## 2022-01-14 DIAGNOSIS — Z9181 History of falling: Secondary | ICD-10-CM | POA: Diagnosis not present

## 2022-01-14 DIAGNOSIS — Z87891 Personal history of nicotine dependence: Secondary | ICD-10-CM | POA: Diagnosis not present

## 2022-01-14 DIAGNOSIS — Z9049 Acquired absence of other specified parts of digestive tract: Secondary | ICD-10-CM | POA: Diagnosis not present

## 2022-01-14 DIAGNOSIS — I252 Old myocardial infarction: Secondary | ICD-10-CM | POA: Diagnosis not present

## 2022-01-14 DIAGNOSIS — K449 Diaphragmatic hernia without obstruction or gangrene: Secondary | ICD-10-CM | POA: Diagnosis not present

## 2022-01-14 DIAGNOSIS — Z86010 Personal history of colonic polyps: Secondary | ICD-10-CM | POA: Diagnosis not present

## 2022-01-14 DIAGNOSIS — I10 Essential (primary) hypertension: Secondary | ICD-10-CM | POA: Diagnosis not present

## 2022-01-14 DIAGNOSIS — H409 Unspecified glaucoma: Secondary | ICD-10-CM | POA: Diagnosis not present

## 2022-01-14 DIAGNOSIS — K5730 Diverticulosis of large intestine without perforation or abscess without bleeding: Secondary | ICD-10-CM | POA: Diagnosis not present

## 2022-01-14 DIAGNOSIS — E785 Hyperlipidemia, unspecified: Secondary | ICD-10-CM | POA: Diagnosis not present

## 2022-01-14 DIAGNOSIS — K5900 Constipation, unspecified: Secondary | ICD-10-CM | POA: Diagnosis not present

## 2022-01-14 DIAGNOSIS — H9191 Unspecified hearing loss, right ear: Secondary | ICD-10-CM | POA: Diagnosis not present

## 2022-01-14 DIAGNOSIS — M1711 Unilateral primary osteoarthritis, right knee: Secondary | ICD-10-CM | POA: Diagnosis not present

## 2022-01-14 DIAGNOSIS — Z7902 Long term (current) use of antithrombotics/antiplatelets: Secondary | ICD-10-CM | POA: Diagnosis not present

## 2022-01-14 DIAGNOSIS — D509 Iron deficiency anemia, unspecified: Secondary | ICD-10-CM | POA: Diagnosis not present

## 2022-01-14 DIAGNOSIS — Z471 Aftercare following joint replacement surgery: Secondary | ICD-10-CM | POA: Diagnosis not present

## 2022-01-15 ENCOUNTER — Telehealth: Payer: Self-pay | Admitting: *Deleted

## 2022-01-15 ENCOUNTER — Other Ambulatory Visit: Payer: Self-pay | Admitting: Physician Assistant

## 2022-01-15 MED ORDER — OXYCODONE-ACETAMINOPHEN 5-325 MG PO TABS: 1.0000 | ORAL_TABLET | Freq: Three times a day (TID) | ORAL | 0 refills | Status: DC | PRN

## 2022-01-15 NOTE — Telephone Encounter (Signed)
sent 

## 2022-01-15 NOTE — Telephone Encounter (Signed)
Spoke to patient for 7 day call. She is doing well> Did ask for refill of pain medication. Thanks.

## 2022-01-16 DIAGNOSIS — M1711 Unilateral primary osteoarthritis, right knee: Secondary | ICD-10-CM | POA: Diagnosis not present

## 2022-01-16 DIAGNOSIS — Z96652 Presence of left artificial knee joint: Secondary | ICD-10-CM | POA: Diagnosis not present

## 2022-01-16 DIAGNOSIS — H9191 Unspecified hearing loss, right ear: Secondary | ICD-10-CM | POA: Diagnosis not present

## 2022-01-16 DIAGNOSIS — Z9181 History of falling: Secondary | ICD-10-CM | POA: Diagnosis not present

## 2022-01-16 DIAGNOSIS — Z9049 Acquired absence of other specified parts of digestive tract: Secondary | ICD-10-CM | POA: Diagnosis not present

## 2022-01-16 DIAGNOSIS — K449 Diaphragmatic hernia without obstruction or gangrene: Secondary | ICD-10-CM | POA: Diagnosis not present

## 2022-01-16 DIAGNOSIS — M1611 Unilateral primary osteoarthritis, right hip: Secondary | ICD-10-CM | POA: Diagnosis not present

## 2022-01-16 DIAGNOSIS — I252 Old myocardial infarction: Secondary | ICD-10-CM | POA: Diagnosis not present

## 2022-01-16 DIAGNOSIS — Z471 Aftercare following joint replacement surgery: Secondary | ICD-10-CM | POA: Diagnosis not present

## 2022-01-16 DIAGNOSIS — I2510 Atherosclerotic heart disease of native coronary artery without angina pectoris: Secondary | ICD-10-CM | POA: Diagnosis not present

## 2022-01-16 DIAGNOSIS — Z86010 Personal history of colonic polyps: Secondary | ICD-10-CM | POA: Diagnosis not present

## 2022-01-16 DIAGNOSIS — Z9861 Coronary angioplasty status: Secondary | ICD-10-CM | POA: Diagnosis not present

## 2022-01-16 DIAGNOSIS — K5900 Constipation, unspecified: Secondary | ICD-10-CM | POA: Diagnosis not present

## 2022-01-16 DIAGNOSIS — K5730 Diverticulosis of large intestine without perforation or abscess without bleeding: Secondary | ICD-10-CM | POA: Diagnosis not present

## 2022-01-16 DIAGNOSIS — D509 Iron deficiency anemia, unspecified: Secondary | ICD-10-CM | POA: Diagnosis not present

## 2022-01-16 DIAGNOSIS — I10 Essential (primary) hypertension: Secondary | ICD-10-CM | POA: Diagnosis not present

## 2022-01-16 DIAGNOSIS — Z7902 Long term (current) use of antithrombotics/antiplatelets: Secondary | ICD-10-CM | POA: Diagnosis not present

## 2022-01-16 DIAGNOSIS — Z7982 Long term (current) use of aspirin: Secondary | ICD-10-CM | POA: Diagnosis not present

## 2022-01-16 DIAGNOSIS — Z87891 Personal history of nicotine dependence: Secondary | ICD-10-CM | POA: Diagnosis not present

## 2022-01-16 DIAGNOSIS — E785 Hyperlipidemia, unspecified: Secondary | ICD-10-CM | POA: Diagnosis not present

## 2022-01-16 DIAGNOSIS — H409 Unspecified glaucoma: Secondary | ICD-10-CM | POA: Diagnosis not present

## 2022-01-18 DIAGNOSIS — Z471 Aftercare following joint replacement surgery: Secondary | ICD-10-CM | POA: Diagnosis not present

## 2022-01-18 DIAGNOSIS — Z86010 Personal history of colonic polyps: Secondary | ICD-10-CM | POA: Diagnosis not present

## 2022-01-18 DIAGNOSIS — M1711 Unilateral primary osteoarthritis, right knee: Secondary | ICD-10-CM | POA: Diagnosis not present

## 2022-01-18 DIAGNOSIS — D509 Iron deficiency anemia, unspecified: Secondary | ICD-10-CM | POA: Diagnosis not present

## 2022-01-18 DIAGNOSIS — Z9049 Acquired absence of other specified parts of digestive tract: Secondary | ICD-10-CM | POA: Diagnosis not present

## 2022-01-18 DIAGNOSIS — Z87891 Personal history of nicotine dependence: Secondary | ICD-10-CM | POA: Diagnosis not present

## 2022-01-18 DIAGNOSIS — Z96652 Presence of left artificial knee joint: Secondary | ICD-10-CM | POA: Diagnosis not present

## 2022-01-18 DIAGNOSIS — I252 Old myocardial infarction: Secondary | ICD-10-CM | POA: Diagnosis not present

## 2022-01-18 DIAGNOSIS — I10 Essential (primary) hypertension: Secondary | ICD-10-CM | POA: Diagnosis not present

## 2022-01-18 DIAGNOSIS — K5900 Constipation, unspecified: Secondary | ICD-10-CM | POA: Diagnosis not present

## 2022-01-18 DIAGNOSIS — M1611 Unilateral primary osteoarthritis, right hip: Secondary | ICD-10-CM | POA: Diagnosis not present

## 2022-01-18 DIAGNOSIS — Z7982 Long term (current) use of aspirin: Secondary | ICD-10-CM | POA: Diagnosis not present

## 2022-01-18 DIAGNOSIS — H409 Unspecified glaucoma: Secondary | ICD-10-CM | POA: Diagnosis not present

## 2022-01-18 DIAGNOSIS — Z9181 History of falling: Secondary | ICD-10-CM | POA: Diagnosis not present

## 2022-01-18 DIAGNOSIS — Z9861 Coronary angioplasty status: Secondary | ICD-10-CM | POA: Diagnosis not present

## 2022-01-18 DIAGNOSIS — E785 Hyperlipidemia, unspecified: Secondary | ICD-10-CM | POA: Diagnosis not present

## 2022-01-18 DIAGNOSIS — K449 Diaphragmatic hernia without obstruction or gangrene: Secondary | ICD-10-CM | POA: Diagnosis not present

## 2022-01-18 DIAGNOSIS — K5730 Diverticulosis of large intestine without perforation or abscess without bleeding: Secondary | ICD-10-CM | POA: Diagnosis not present

## 2022-01-18 DIAGNOSIS — Z7902 Long term (current) use of antithrombotics/antiplatelets: Secondary | ICD-10-CM | POA: Diagnosis not present

## 2022-01-18 DIAGNOSIS — H9191 Unspecified hearing loss, right ear: Secondary | ICD-10-CM | POA: Diagnosis not present

## 2022-01-18 DIAGNOSIS — I2510 Atherosclerotic heart disease of native coronary artery without angina pectoris: Secondary | ICD-10-CM | POA: Diagnosis not present

## 2022-01-19 DIAGNOSIS — D509 Iron deficiency anemia, unspecified: Secondary | ICD-10-CM | POA: Diagnosis not present

## 2022-01-19 DIAGNOSIS — K449 Diaphragmatic hernia without obstruction or gangrene: Secondary | ICD-10-CM | POA: Diagnosis not present

## 2022-01-19 DIAGNOSIS — Z9049 Acquired absence of other specified parts of digestive tract: Secondary | ICD-10-CM | POA: Diagnosis not present

## 2022-01-19 DIAGNOSIS — Z9861 Coronary angioplasty status: Secondary | ICD-10-CM | POA: Diagnosis not present

## 2022-01-19 DIAGNOSIS — E785 Hyperlipidemia, unspecified: Secondary | ICD-10-CM | POA: Diagnosis not present

## 2022-01-19 DIAGNOSIS — M1711 Unilateral primary osteoarthritis, right knee: Secondary | ICD-10-CM | POA: Diagnosis not present

## 2022-01-19 DIAGNOSIS — I2510 Atherosclerotic heart disease of native coronary artery without angina pectoris: Secondary | ICD-10-CM | POA: Diagnosis not present

## 2022-01-19 DIAGNOSIS — I10 Essential (primary) hypertension: Secondary | ICD-10-CM | POA: Diagnosis not present

## 2022-01-19 DIAGNOSIS — Z86010 Personal history of colonic polyps: Secondary | ICD-10-CM | POA: Diagnosis not present

## 2022-01-19 DIAGNOSIS — Z471 Aftercare following joint replacement surgery: Secondary | ICD-10-CM | POA: Diagnosis not present

## 2022-01-19 DIAGNOSIS — Z7902 Long term (current) use of antithrombotics/antiplatelets: Secondary | ICD-10-CM | POA: Diagnosis not present

## 2022-01-19 DIAGNOSIS — H409 Unspecified glaucoma: Secondary | ICD-10-CM | POA: Diagnosis not present

## 2022-01-19 DIAGNOSIS — H9191 Unspecified hearing loss, right ear: Secondary | ICD-10-CM | POA: Diagnosis not present

## 2022-01-19 DIAGNOSIS — Z7982 Long term (current) use of aspirin: Secondary | ICD-10-CM | POA: Diagnosis not present

## 2022-01-19 DIAGNOSIS — K5730 Diverticulosis of large intestine without perforation or abscess without bleeding: Secondary | ICD-10-CM | POA: Diagnosis not present

## 2022-01-19 DIAGNOSIS — Z9181 History of falling: Secondary | ICD-10-CM | POA: Diagnosis not present

## 2022-01-19 DIAGNOSIS — Z87891 Personal history of nicotine dependence: Secondary | ICD-10-CM | POA: Diagnosis not present

## 2022-01-19 DIAGNOSIS — I252 Old myocardial infarction: Secondary | ICD-10-CM | POA: Diagnosis not present

## 2022-01-19 DIAGNOSIS — K5900 Constipation, unspecified: Secondary | ICD-10-CM | POA: Diagnosis not present

## 2022-01-19 DIAGNOSIS — Z96652 Presence of left artificial knee joint: Secondary | ICD-10-CM | POA: Diagnosis not present

## 2022-01-19 DIAGNOSIS — M1611 Unilateral primary osteoarthritis, right hip: Secondary | ICD-10-CM | POA: Diagnosis not present

## 2022-01-21 DIAGNOSIS — Z7982 Long term (current) use of aspirin: Secondary | ICD-10-CM | POA: Diagnosis not present

## 2022-01-21 DIAGNOSIS — Z9049 Acquired absence of other specified parts of digestive tract: Secondary | ICD-10-CM | POA: Diagnosis not present

## 2022-01-21 DIAGNOSIS — Z471 Aftercare following joint replacement surgery: Secondary | ICD-10-CM | POA: Diagnosis not present

## 2022-01-21 DIAGNOSIS — I2510 Atherosclerotic heart disease of native coronary artery without angina pectoris: Secondary | ICD-10-CM | POA: Diagnosis not present

## 2022-01-21 DIAGNOSIS — K449 Diaphragmatic hernia without obstruction or gangrene: Secondary | ICD-10-CM | POA: Diagnosis not present

## 2022-01-21 DIAGNOSIS — I10 Essential (primary) hypertension: Secondary | ICD-10-CM | POA: Diagnosis not present

## 2022-01-21 DIAGNOSIS — K5900 Constipation, unspecified: Secondary | ICD-10-CM | POA: Diagnosis not present

## 2022-01-21 DIAGNOSIS — K5730 Diverticulosis of large intestine without perforation or abscess without bleeding: Secondary | ICD-10-CM | POA: Diagnosis not present

## 2022-01-21 DIAGNOSIS — E785 Hyperlipidemia, unspecified: Secondary | ICD-10-CM | POA: Diagnosis not present

## 2022-01-21 DIAGNOSIS — Z87891 Personal history of nicotine dependence: Secondary | ICD-10-CM | POA: Diagnosis not present

## 2022-01-21 DIAGNOSIS — D509 Iron deficiency anemia, unspecified: Secondary | ICD-10-CM | POA: Diagnosis not present

## 2022-01-21 DIAGNOSIS — Z86010 Personal history of colonic polyps: Secondary | ICD-10-CM | POA: Diagnosis not present

## 2022-01-21 DIAGNOSIS — Z9861 Coronary angioplasty status: Secondary | ICD-10-CM | POA: Diagnosis not present

## 2022-01-21 DIAGNOSIS — H9191 Unspecified hearing loss, right ear: Secondary | ICD-10-CM | POA: Diagnosis not present

## 2022-01-21 DIAGNOSIS — M1711 Unilateral primary osteoarthritis, right knee: Secondary | ICD-10-CM | POA: Diagnosis not present

## 2022-01-21 DIAGNOSIS — M1611 Unilateral primary osteoarthritis, right hip: Secondary | ICD-10-CM | POA: Diagnosis not present

## 2022-01-21 DIAGNOSIS — Z96652 Presence of left artificial knee joint: Secondary | ICD-10-CM | POA: Diagnosis not present

## 2022-01-21 DIAGNOSIS — Z9181 History of falling: Secondary | ICD-10-CM | POA: Diagnosis not present

## 2022-01-21 DIAGNOSIS — Z7902 Long term (current) use of antithrombotics/antiplatelets: Secondary | ICD-10-CM | POA: Diagnosis not present

## 2022-01-21 DIAGNOSIS — H409 Unspecified glaucoma: Secondary | ICD-10-CM | POA: Diagnosis not present

## 2022-01-21 DIAGNOSIS — I252 Old myocardial infarction: Secondary | ICD-10-CM | POA: Diagnosis not present

## 2022-01-22 ENCOUNTER — Encounter: Payer: Self-pay | Admitting: Orthopaedic Surgery

## 2022-01-22 ENCOUNTER — Telehealth: Payer: Self-pay | Admitting: *Deleted

## 2022-01-22 ENCOUNTER — Ambulatory Visit: Payer: Medicare Other | Admitting: Physical Therapy

## 2022-01-22 ENCOUNTER — Encounter: Payer: Self-pay | Admitting: Physical Therapy

## 2022-01-22 ENCOUNTER — Ambulatory Visit (INDEPENDENT_AMBULATORY_CARE_PROVIDER_SITE_OTHER): Payer: Medicare Other | Admitting: Orthopaedic Surgery

## 2022-01-22 DIAGNOSIS — M25662 Stiffness of left knee, not elsewhere classified: Secondary | ICD-10-CM

## 2022-01-22 DIAGNOSIS — Z96652 Presence of left artificial knee joint: Secondary | ICD-10-CM

## 2022-01-22 DIAGNOSIS — R600 Localized edema: Secondary | ICD-10-CM

## 2022-01-22 DIAGNOSIS — R262 Difficulty in walking, not elsewhere classified: Secondary | ICD-10-CM

## 2022-01-22 DIAGNOSIS — M25562 Pain in left knee: Secondary | ICD-10-CM

## 2022-01-22 NOTE — Progress Notes (Signed)
Post-Op Visit Note   Patient: Jenna Thompson           Date of Birth: 12-19-1945           MRN: [id number] Visit Date: 01/22/2022 PCP: Deatra James, MD   Assessment & Plan:  Chief Complaint:  Chief Complaint  Patient presents with   Left Knee - Follow-up    Left total knee arthroplasty 01/08/2022   Visit Diagnoses:  1. Status post total left knee replacement     Plan: Ms. [name] is here for 2-week postop visit status post left total knee on 01/08/2022.  She completed home health PT yesterday.  She has her first outpatient PT visit at our office this morning.  Overall doing well has no complaints.  Examination of the left knee shows healed surgical incision.  No drainage or signs of infection.  Expected postoperative swelling.  Range of motion is adequate.  Peroneal nerve function intact.  No varus valgus instability.  Patient is doing very well from her surgery.  Dental prophylaxis reinforced.  Sutures removed Steri-Strips applied.  Recheck in 4 weeks with two-view x-rays of the left knee.  Follow-Up Instructions: Return in about 4 weeks (around 02/19/2022).   Orders:  No orders of the defined types were placed in this encounter.  No orders of the defined types were placed in this encounter.   Imaging: No results found.  PMFS History: Patient Active Problem List   Diagnosis Date Noted   Primary localized osteoarthritis of left knee 01/09/2022   Status post total left knee replacement 01/08/2022   Primary osteoarthritis of right knee 01/14/2020   Primary osteoarthritis of right hip 01/14/2020   Primary osteoarthritis of left knee 01/14/2020   Hyperlipidemia 05/05/2017   S/P PTCA (percutaneous transluminal coronary angioplasty)    Iron deficiency anemia    Acute myocardial infarction [facility])    Primary osteoarthritis of both knees 12/20/2016   Personal history of colonic polyps 12/26/2010   Past Medical History:  Diagnosis Date   Anemia, iron deficiency    Arthritis     L>R knee   Atrophic gastritis without mention of hemorrhage    CAD (coronary artery disease)    a. s/p Inferior STEMI in 04/2017 with angioplasty and thrombectomy to mid-RCA, repeat cath later that admission with aspiration thrombectomy of rPDA and residual thrombus along distal vessel too small for intervention   Colon polyps    Diaphragmatic hernia without mention of obstruction or gangrene    Diverticulosis of colon (without mention of hemorrhage)    Hiatal hernia    Hypertension    Unspecified hemorrhoids without mention of complication     Family History  Problem Relation Age of Onset   Kidney disease Mother    Colon cancer Neg Hx    Breast cancer Neg Hx     Past Surgical History:  Procedure Laterality Date   ABDOMINAL HYSTERECTOMY  1978   CHOLECYSTECTOMY  1979   CORONARY ANGIOGRAPHY N/A 05/02/2017   Procedure: CORONARY ANGIOGRAPHY (CATH LAB);  Surgeon: Yvonne Kendall, MD;  [facility]: [facility];  Service: Cardiovascular;  Laterality: N/A;   CORONARY BALLOON ANGIOPLASTY N/A 05/02/2017   Procedure: CORONARY BALLOON ANGIOPLASTY;  Surgeon: Yvonne Kendall, MD;  Location: [facility];  Service: Cardiovascular;  Laterality: N/A;   CORONARY THROMBECTOMY N/A 05/02/2017   Procedure: Coronary Thrombectomy;  Surgeon: Yvonne Kendall, MD;  Location: [facility];  Service: Cardiovascular;  Laterality: N/A;   CORONARY/GRAFT ACUTE MI REVASCULARIZATION  N/A 05/01/2017   Procedure: Coronary/Graft Acute MI Revascularization;  Surgeon: Burnell Blanks, MD;  Location: [place] CV LAB;  Service: Cardiovascular;  Laterality: N/A;   KNEE SURGERY  2001   left    LEFT HEART CATH AND CORONARY ANGIOGRAPHY N/A 05/01/2017   Procedure: LEFT HEART CATH AND CORONARY ANGIOGRAPHY;  Surgeon: Burnell Blanks, MD;  Location: [place] CV LAB;  Service: Cardiovascular;  Laterality: N/A;   TONSILLECTOMY AND ADENOIDECTOMY  1970   TOTAL KNEE ARTHROPLASTY Left 01/08/2022    Procedure: LEFT TOTAL KNEE ARTHROPLASTY;  Surgeon: Leandrew Koyanagi, MD;  Location: [place];  Service: Orthopedics;  Laterality: Left;   Social History   Occupational History   Occupation: Retired  Tobacco Use   Smoking status: Former   Smokeless tobacco: Never  Scientific laboratory technician Use: Never used  Substance and Sexual Activity   Alcohol use: No   Drug use: No   Sexual activity: Not on file

## 2022-01-22 NOTE — Telephone Encounter (Signed)
Ortho bundle 14 day in person visit completed.

## 2022-01-22 NOTE — Therapy (Signed)
OUTPATIENT PHYSICAL THERAPY LOWER EXTREMITY EVALUATION   Patient Name: DULCINEA KINSER MRN: [id number] DOB:01-13-1946, 76 y.o., female Today's Date: 01/22/2022   PT End of Session - 01/22/22 1111     Visit Number 1    Number of Visits 24    Date for PT Re-Evaluation 04/19/22    Authorization Type UHC/Medicare    Progress Note Due on Visit 10    PT Start Time 1108    PT Stop Time 1150    PT Time Calculation (min) 42 min    Equipment Utilized During Treatment Gait belt    Activity Tolerance Patient tolerated treatment well    Behavior During Therapy WFL for tasks assessed/performed             Past Medical History:  Diagnosis Date   Anemia, iron deficiency    Arthritis    L>R knee   Atrophic gastritis without mention of hemorrhage    CAD (coronary artery disease)    a. s/p Inferior STEMI in 04/2017 with angioplasty and thrombectomy to mid-RCA, repeat cath later that admission with aspiration thrombectomy of rPDA and residual thrombus along distal vessel too small for intervention   Colon polyps    Diaphragmatic hernia without mention of obstruction or gangrene    Diverticulosis of colon (without mention of hemorrhage)    Hiatal hernia    Hypertension    Unspecified hemorrhoids without mention of complication    Past Surgical History:  Procedure Laterality Date   ABDOMINAL HYSTERECTOMY  1978   CHOLECYSTECTOMY  1979   CORONARY ANGIOGRAPHY N/A 05/02/2017   Procedure: CORONARY ANGIOGRAPHY (CATH LAB);  Surgeon: Yvonne Kendall, MD;  [facility];  Service: Cardiovascular;  Laterality: N/A;   CORONARY BALLOON ANGIOPLASTY N/A 05/02/2017   Procedure: CORONARY BALLOON ANGIOPLASTY;  Surgeon: Yvonne Kendall, MD;  [facility];  Service: Cardiovascular;  Laterality: N/A;   CORONARY THROMBECTOMY N/A 05/02/2017   Procedure: Coronary Thrombectomy;  Surgeon: Yvonne Kendall, MD;  [facility];  Service: Cardiovascular;  Laterality: N/A;    CORONARY/GRAFT ACUTE MI REVASCULARIZATION N/A 05/01/2017   Procedure: Coronary/Graft Acute MI Revascularization;  Surgeon: Kathleene Hazel, MD;  [facility];  Service: Cardiovascular;  Laterality: N/A;   KNEE SURGERY  2001   left    LEFT HEART CATH AND CORONARY ANGIOGRAPHY N/A 05/01/2017   Procedure: LEFT HEART CATH AND CORONARY ANGIOGRAPHY;  Surgeon: Kathleene Hazel, MD;  [facility];  Service: Cardiovascular;  Laterality: N/A;   TONSILLECTOMY AND ADENOIDECTOMY  1970   TOTAL KNEE ARTHROPLASTY Left 01/08/2022   Procedure: LEFT TOTAL KNEE ARTHROPLASTY;  Surgeon: Tarry Kos, MD;  Location: [facility];  Service: Orthopedics;  Laterality: Left;   Patient Active Problem List   Diagnosis Date Noted   Primary localized osteoarthritis of left knee 01/09/2022   Status post total left knee replacement 01/08/2022   Primary osteoarthritis of right knee 01/14/2020   Primary osteoarthritis of right hip 01/14/2020   Primary osteoarthritis of left knee 01/14/2020   Hyperlipidemia 05/05/2017   S/P PTCA (percutaneous transluminal coronary angioplasty)    Iron deficiency anemia    Acute myocardial infarction [facility])    Primary osteoarthritis of both knees 12/20/2016   Personal history of colonic polyps 12/26/2010    PCP: Deatra James, MD  REFERRING PROVIDER: Tarry Kos, MD  REFERRING DIAG: [phone] (ICD-10-CM) - Primary localized osteoarthritis of left knee  THERAPY DIAG:  Acute pain of left  knee  Stiffness of left knee, not elsewhere classified  Difficulty in walking, not elsewhere classified  Localized edema  Rationale for Evaluation and Treatment Rehabilitation  ONSET DATE: 01/08/22 left TKA SUBJECTIVE:   SUBJECTIVE STATEMENT: Pt arriving to therapy reporting 9/10 since the RN just took her staples out right before therapy session. Pt amb into clinic with RW.   PERTINENT HISTORY: 01/08/22 left TKA anemia, arthritis, CAD, diverticulosis,  HTN, Coronary/graft acute MI revascularization 2019, abdominla hysterectomy,   PAIN:  NPRS scale: 9/10 Pain location: left knee Pain description: achy, burning, throbbing Aggravating factors: bending, sitting too long Relieving factors: ice, pain meds  PRECAUTIONS: Fall  WEIGHT BEARING RESTRICTIONS No  FALLS:  Has patient fallen in last 6 months? No  LIVING ENVIRONMENT: Lives with: lives with their family Lives in: House/apartment Stairs: Yes: Internal: 1 flight steps; on left going up Has following equipment at home: Walker - 2 wheeled, st cane , raised toilet  OCCUPATION: retired  PLOF: Independent  PATIENT GOALS be able to walk without pain   OBJECTIVE:   DIAGNOSTIC FINDINGS: post op x-ray  PATIENT SURVEYS:  01/22/22: FOTO intake: 47%  predicted:  68%  COGNITION:  Overall cognitive status: WFL    SENSATION: WFL  EDEMA:  01/22/22:  Circumferential: left: 44.5 centimeters        Right 39 centimeters    POSTURE: rounded shoulders and forward head  PALPATION: Tenderness noted around entire left knee joint  LOWER EXTREMITY ROM:  A = Active ROM P=passive ROM Right 01/22/22 Left 01/22/22  Hip flexion    Hip extension    Hip abduction    Hip adduction    Hip internal rotation    Hip external rotation    Knee flexion A: 130 A: 78 P: 82  Knee extension A: 0 A: -8 P: -6  Ankle dorsiflexion    Ankle plantarflexion    Ankle inversion    Ankle eversion     (Blank rows = not tested)  LOWER EXTREMITY MMT:  MMT Right 01/22/22 Left 01/22/22  Hip flexion 5/5 3/5  Hip extension    Hip abduction 5/5 4/5  Hip adduction 5/5 4/5  Hip internal rotation    Hip external rotation    Knee flexion 5/5 3/5  Knee extension 5/5 3/5  Ankle dorsiflexion    Ankle plantarflexion    Ankle inversion    Ankle eversion     (Blank rows = not tested)   FUNCTIONAL TESTS:  01/22/22: 5 times sit to stand: 27 seconds c UE support  GAIT: Distance walked: 30 feet   Assistive device utilized: Walker - 2 wheeled Level of assistance: Modified independence Comments: antalgic step through gait pattern   TODAY'S TREATMENT: 01/22/22:  Therex:  HEP instruction/performance c cues for techniques, handout provided.  Trial set performed of each for comprehension and symptom assessment.  See below for exercise list Modalities:  Vasopneumatic device : 34 degrees, low compression x 10 minutes    PATIENT EDUCATION:  Education details: HEP, POC Person educated: Patient Education method: Explanation, Demonstration, Verbal cues, and Handouts Education comprehension: verbalized understanding, returned demonstration, and verbal cues required    HOME EXERCISE PROGRAM: Access Code: [email or web address] URL: [email or web address] Date: 01/22/2022 Prepared by: Narda Amber  Exercises - Supine Quad Set  - 4 x daily - 7 x weekly - 2 sets - 10 reps - 5 seconds hold - Supine Heel Slide with Strap  - 4 x daily - 7 x weekly - 2 sets - 10  reps - 5 seconds hold - Seated Long Arc Quad  - 4 x daily - 7 x weekly - 2 sets - 10 reps - 5 seconds hold - Seated Heel Slide  - 4 x daily - 7 x weekly - 2 sets - 10 reps - Sit to Stand with Counter Support  - 4 x daily - 7 x weekly - 10 reps - Seated Small Alternating Straight Leg Lifts with Heel Touch  - 4 x daily - 7 x weekly - 10 reps  ASSESSMENT:  CLINICAL IMPRESSION: Patient is a 76 y.o. who comes to clinic with complaints of left knee pain s/p left TKA on 01/08/22 with mobility, strength and movement coordination deficits that impair their ability to perform usual daily and recreational functional activities without increase difficulty/symptoms at this time. Pt stating she was discharged from HHPT with knee flexion of 90 degrees in sitting. Pt arriving today with increased pain due to staples being removed prior to therapy. Patient to benefit from skilled PT services to address impairments and limitations to improve to  previous level of function without restriction secondary to condition.     OBJECTIVE IMPAIRMENTS Abnormal gait, decreased activity tolerance, decreased balance, decreased mobility, difficulty walking, decreased ROM, decreased strength, increased edema, impaired flexibility, and pain.   ACTIVITY LIMITATIONS sitting, standing, squatting, sleeping, stairs, transfers, and bed mobility  PARTICIPATION LIMITATIONS: meal prep, cleaning, laundry, and community activity  PERSONAL FACTORS anemia, arthritis, CAD, diverticulosis, HTN, Coronary/graft acute MI revascularization 2019, abdominla hysterectomy,  are also affecting patient's functional outcome.   REHAB POTENTIAL: Good  CLINICAL DECISION MAKING: Stable/uncomplicated  EVALUATION COMPLEXITY: Low   GOALS: Goals reviewed with patient? Yes  Short term PT Goals (target date for Short term goals are 3 weeks 02/15/22) Patient will demonstrate independent use of home exercise program to maintain progress from in clinic treatments. Goal status: New  Pt will improve her 5 time to sit to stand to </= 15 seconds with/without UE support Goal Status: New   Long term PT goals (target dates for all long term goals are 12 weeks  04/19/22 )   1. Patient will demonstrate/report pain at worst less than or equal to 2/10 to facilitate minimal limitation in daily activity secondary to pain symptoms. Goal status: New   2. Patient will demonstrate independent use of home exercise program to facilitate ability to maintain/progress functional gains from skilled physical therapy services. Goal status: New   3. Patient will demonstrate FOTO outcome > or = 68 % to indicate reduced disability due to condition. Goal status: New   4.  Patient will demonstrate left  LE MMT 5/5 throughout to faciltiate usual transfers, stairs, squatting at [facility] for daily life.   Goal status: New   5.  pt will be able to navigate 1 flight of stairs with single hand rail with step  over step pattern.    Goal status: New   6.  Pt will improve left knee active ROM arc 2-115 degrees.    Goal status: New  7. Pt will amb 500 feet with no device on level surfaces with normalized gait pattern.   A. Goal Status: New       PLAN:  PT FREQUENCY: 2-3 x/week  PT DURATION: 12 weeks  PLANNED INTERVENTIONS: Therapeutic exercises, Therapeutic activity, Neuro Muscular re-education, Balance training, Gait training, Patient/Family education, Joint mobilization, Stair training, DME instructions, Dry Needling, Electrical stimulation, Traction, Cryotherapy, Moist heat, Taping, Ultrasound, Ionotophoresis 4mg /ml Dexamethasone, and Manual therapy.  All included  unless contraindicated   PLAN FOR NEXT SESSION: Review HEP knowledge/results, knee ROM, quad strengthening, vaso       Oretha Caprice, PT, MPT 01/22/2022, 11:58 AM

## 2022-01-24 ENCOUNTER — Ambulatory Visit: Payer: Medicare Other | Admitting: Physical Therapy

## 2022-01-24 ENCOUNTER — Encounter: Payer: Self-pay | Admitting: Physical Therapy

## 2022-01-24 DIAGNOSIS — M25662 Stiffness of left knee, not elsewhere classified: Secondary | ICD-10-CM

## 2022-01-24 DIAGNOSIS — M25562 Pain in left knee: Secondary | ICD-10-CM | POA: Diagnosis not present

## 2022-01-24 DIAGNOSIS — R262 Difficulty in walking, not elsewhere classified: Secondary | ICD-10-CM

## 2022-01-24 DIAGNOSIS — R600 Localized edema: Secondary | ICD-10-CM

## 2022-01-24 NOTE — Therapy (Signed)
OUTPATIENT PHYSICAL THERAPY TREATMENT NOTE   Patient Name: Jenna Thompson MRN: [id number] DOB:12/31/1945, 76 y.o., female Today's Date: 01/24/2022  END OF SESSION:   PT End of Session - 01/24/22 1112     Visit Number 2    Number of Visits 24    Date for PT Re-Evaluation 04/19/22    Authorization Type UHC/Medicare    Progress Note Due on Visit 10    PT Start Time 1104    PT Stop Time 1142    PT Time Calculation (min) 38 min    Equipment Utilized During Treatment Gait belt    Activity Tolerance Patient tolerated treatment well    Behavior During Therapy WFL for tasks assessed/performed             Past Medical History:  Diagnosis Date   Anemia, iron deficiency    Arthritis    L>R knee   Atrophic gastritis without mention of hemorrhage    CAD (coronary artery disease)    a. s/p Inferior STEMI in 04/2017 with angioplasty and thrombectomy to mid-RCA, repeat cath later that admission with aspiration thrombectomy of rPDA and residual thrombus along distal vessel too small for intervention   Colon polyps    Diaphragmatic hernia without mention of obstruction or gangrene    Diverticulosis of colon (without mention of hemorrhage)    Hiatal hernia    Hypertension    Unspecified hemorrhoids without mention of complication    Past Surgical History:  Procedure Laterality Date   ABDOMINAL HYSTERECTOMY  1978   CHOLECYSTECTOMY  1979   CORONARY ANGIOGRAPHY N/A 05/02/2017   Procedure: CORONARY ANGIOGRAPHY (CATH LAB);  Surgeon: Nelva Bush, MD;  Location: [facility];  Service: Cardiovascular;  Laterality: N/A;   CORONARY BALLOON ANGIOPLASTY N/A 05/02/2017   Procedure: CORONARY BALLOON ANGIOPLASTY;  Surgeon: Nelva Bush, MD;  Location: [place] [facility];  Service: Cardiovascular;  Laterality: N/A;   CORONARY THROMBECTOMY N/A 05/02/2017   Procedure: Coronary Thrombectomy;  Surgeon: Nelva Bush, MD;  Location: [place] [facility];  Service: Cardiovascular;   Laterality: N/A;   CORONARY/GRAFT ACUTE MI REVASCULARIZATION N/A 05/01/2017   Procedure: Coronary/Graft Acute MI Revascularization;  Surgeon: Burnell Blanks, MD;  Location: [facility];  Service: Cardiovascular;  Laterality: N/A;   KNEE SURGERY  2001   left    LEFT HEART CATH AND CORONARY ANGIOGRAPHY N/A 05/01/2017   Procedure: LEFT HEART CATH AND CORONARY ANGIOGRAPHY;  Surgeon: Burnell Blanks, MD;  Location: [place];  Service: Cardiovascular;  Laterality: N/A;   TONSILLECTOMY AND ADENOIDECTOMY  1970   TOTAL KNEE ARTHROPLASTY Left 01/08/2022   Procedure: LEFT TOTAL KNEE ARTHROPLASTY;  Surgeon: Leandrew Koyanagi, MD;  Location: [place];  Service: Orthopedics;  Laterality: Left;   Patient Active Problem List   Diagnosis Date Noted   Primary localized osteoarthritis of left knee 01/09/2022   Status post total left knee replacement 01/08/2022   Primary osteoarthritis of right knee 01/14/2020   Primary osteoarthritis of right hip 01/14/2020   Primary osteoarthritis of left knee 01/14/2020   Hyperlipidemia 05/05/2017   S/P PTCA (percutaneous transluminal coronary angioplasty)    Iron deficiency anemia    Acute myocardial infarction [facility])    Primary osteoarthritis of both knees 12/20/2016   Personal history of colonic polyps 12/26/2010     THERAPY DIAG:  Acute pain of left knee  Stiffness of left knee, not elsewhere classified  Difficulty in walking, not elsewhere classified  Localized edema  PCP: Deatra James, MD   REFERRING PROVIDER: Tarry Kos, MD   REFERRING DIAG: [phone] (ICD-10-CM) - Primary localized osteoarthritis of left knee   THERAPY DIAG:  Acute pain of left knee   Stiffness of left knee, not elsewhere classified   Difficulty in walking, not elsewhere classified   Localized edema   Rationale for Evaluation and Treatment Rehabilitation   ONSET DATE: 01/08/22 left TKA SUBJECTIVE:    SUBJECTIVE STATEMENT: Pt arriving stating she  has been doing HEP and has been using her CPM, pain is better but has some soreness   PERTINENT HISTORY: 01/08/22 left TKA anemia, arthritis, CAD, diverticulosis, HTN, Coronary/graft acute MI revascularization 2019, abdominla hysterectomy,    PAIN:  NPRS scale: 5/10 Pain location: left knee Pain description: achy, burning, throbbing Aggravating factors: bending, sitting too long Relieving factors: ice, pain meds   PRECAUTIONS: Fall   WEIGHT BEARING RESTRICTIONS No   FALLS:  Has patient fallen in last 6 months? No   LIVING ENVIRONMENT: Lives with: lives with their family Lives in: House/apartment Stairs: Yes: Internal: 1 flight steps; on left going up Has following equipment at home: Walker - 2 wheeled, st cane , raised toilet   OCCUPATION: retired   PLOF: Independent   PATIENT GOALS be able to walk without pain     OBJECTIVE:    DIAGNOSTIC FINDINGS: post op x-ray   PATIENT SURVEYS:  01/22/22: FOTO intake: 47%  predicted:  68%   COGNITION:           Overall cognitive status: WFL                    SENSATION: WFL   EDEMA:  01/22/22:  Circumferential: left: 44.5 centimeters                            Right 39 centimeters       POSTURE: rounded shoulders and forward head   PALPATION: Tenderness noted around entire left knee joint   LOWER EXTREMITY ROM:   A = Active ROM P=passive ROM Right 01/22/22 Left 01/22/22  Hip flexion      Hip extension      Hip abduction      Hip adduction      Hip internal rotation      Hip external rotation      Knee flexion A: 130 A: 78 P: 82  Knee extension A: 0 A: -8 P: -6  Ankle dorsiflexion      Ankle plantarflexion      Ankle inversion      Ankle eversion       (Blank rows = not tested)   LOWER EXTREMITY MMT:   MMT Right 01/22/22 Left 01/22/22  Hip flexion 5/5 3/5  Hip extension      Hip abduction 5/5 4/5  Hip adduction 5/5 4/5  Hip internal rotation      Hip external rotation      Knee flexion 5/5  3/5  Knee extension 5/5 3/5  Ankle dorsiflexion      Ankle plantarflexion      Ankle inversion      Ankle eversion       (Blank rows = not tested)     FUNCTIONAL TESTS:  01/22/22: 5 times sit to stand: 27 seconds c UE support   GAIT: Distance walked: 30 feet  Assistive device utilized: Walker - 2 wheeled Level of assistance: Modified independence Comments: antalgic step  through gait pattern     TODAY'S TREATMENT: 01/24/22 -Seated knee flexion and extension AROM X 15 -Seated knee flexion AAROM 5 sec hold X 15 -Supine quad set 5 sec X 15 -Supine heelslides AAROM 5 sec X 10 -Seated SLR X10 -Sit to stand X 5 with UE support and slow lower -Nu step L5 X 8 min LE/UE  -Manual therapy: Left knee PROM with overpressure flexion and extension   01/22/22:  Therex:  HEP instruction/performance c cues for techniques, handout provided.  Trial set performed of each for comprehension and symptom assessment.  See below for exercise list Modalities:  Vasopneumatic device : 34 degrees, low compression x 10 minutes       PATIENT EDUCATION:  Education details: HEP, POC Person educated: Patient Education method: Explanation, Demonstration, Verbal cues, and Handouts Education comprehension: verbalized understanding, returned demonstration, and verbal cues required       HOME EXERCISE PROGRAM: Access Code: [email or web address] URL: [email or web address] Date: 01/22/2022 Prepared by: Narda Amber   Exercises - Supine Quad Set  - 4 x daily - 7 x weekly - 2 sets - 10 reps - 5 seconds hold - Supine Heel Slide with Strap  - 4 x daily - 7 x weekly - 2 sets - 10 reps - 5 seconds hold - Seated Long Arc Quad  - 4 x daily - 7 x weekly - 2 sets - 10 reps - 5 seconds hold - Seated Heel Slide  - 4 x daily - 7 x weekly - 2 sets - 10 reps - Sit to Stand with Counter Support  - 4 x daily - 7 x weekly - 10 reps - Seated Small Alternating Straight Leg Lifts with Heel Touch  - 4 x daily - 7 x  weekly - 10 reps   ASSESSMENT:   CLINICAL IMPRESSION: She is doing reasonably well to this point S/P Lt TKA. She has been doing HEP and her ROM and quad activation are progressing. She declined ice/vaso today and will ice when she gets home to control soreness and swelling.        OBJECTIVE IMPAIRMENTS Abnormal gait, decreased activity tolerance, decreased balance, decreased mobility, difficulty walking, decreased ROM, decreased strength, increased edema, impaired flexibility, and pain.    ACTIVITY LIMITATIONS sitting, standing, squatting, sleeping, stairs, transfers, and bed mobility   PARTICIPATION LIMITATIONS: meal prep, cleaning, laundry, and community activity   PERSONAL FACTORS anemia, arthritis, CAD, diverticulosis, HTN, Coronary/graft acute MI revascularization 2019, abdominla hysterectomy,  are also affecting patient's functional outcome.    REHAB POTENTIAL: Good   CLINICAL DECISION MAKING: Stable/uncomplicated   EVALUATION COMPLEXITY: Low     GOALS: Goals reviewed with patient? Yes   Short term PT Goals (target date for Short term goals are 3 weeks 02/15/22) Patient will demonstrate independent use of home exercise program to maintain progress from in clinic treatments. Goal status: New   Pt will improve her 5 time to sit to stand to </= 15 seconds with/without UE support Goal Status: New   Long term PT goals (target dates for all long term goals are 12 weeks  04/19/22 )   1. Patient will demonstrate/report pain at worst less than or equal to 2/10 to facilitate minimal limitation in daily activity secondary to pain symptoms. Goal status: New   2. Patient will demonstrate independent use of home exercise program to facilitate ability to maintain/progress functional gains from skilled physical therapy services. Goal status: New   3. Patient will demonstrate FOTO outcome > or =  68 % to indicate reduced disability due to condition. Goal status: New   4.  Patient will  demonstrate left  LE MMT 5/5 throughout to faciltiate usual transfers, stairs, squatting at [facility] for daily life.    Goal status: New   5.  pt will be able to navigate 1 flight of stairs with single hand rail with step over step pattern.    Goal status: New   6.  Pt will improve left knee active ROM arc 2-115 degrees.    Goal status: New   7. Pt will amb 500 feet with no device on level surfaces with normalized gait pattern.             A. Goal Status: New           PLAN:   PT FREQUENCY: 2-3 x/week   PT DURATION: 12 weeks   PLANNED INTERVENTIONS: Therapeutic exercises, Therapeutic activity, Neuro Muscular re-education, Balance training, Gait training, Patient/Family education, Joint mobilization, Stair training, DME instructions, Dry Needling, Electrical stimulation, Traction, Cryotherapy, Moist heat, Taping, Ultrasound, Ionotophoresis 4mg /ml Dexamethasone, and Manual therapy.  All included unless contraindicated     PLAN FOR NEXT SESSION: knee ROM, quad strengthening, vaso if desired or she can ice at home.   , PT,DPT 01/24/2022, 11:34 AM

## 2022-01-25 ENCOUNTER — Telehealth: Payer: Self-pay | Admitting: *Deleted

## 2022-01-25 ENCOUNTER — Other Ambulatory Visit: Payer: Self-pay | Admitting: Physician Assistant

## 2022-01-25 ENCOUNTER — Other Ambulatory Visit: Payer: Self-pay | Admitting: Cardiovascular Disease

## 2022-01-25 MED ORDER — OXYCODONE-ACETAMINOPHEN 5-325 MG PO TABS: 1.0000 | ORAL_TABLET | Freq: Three times a day (TID) | ORAL | 0 refills | Status: DC | PRN

## 2022-01-25 NOTE — Telephone Encounter (Signed)
Patient of Dr. Phoebe Sharps had surgery 01/08/22 and has just started OPPT this week for a Left total knee. She asked for more pain medication. She had appointment this week, but thought she had plenty. She does not. Could we refill. Thanks.

## 2022-02-05 ENCOUNTER — Ambulatory Visit: Payer: Medicare Other | Admitting: Physical Therapy

## 2022-02-05 ENCOUNTER — Encounter: Payer: Self-pay | Admitting: Physical Therapy

## 2022-02-05 DIAGNOSIS — R262 Difficulty in walking, not elsewhere classified: Secondary | ICD-10-CM | POA: Diagnosis not present

## 2022-02-05 DIAGNOSIS — M25562 Pain in left knee: Secondary | ICD-10-CM | POA: Diagnosis not present

## 2022-02-05 DIAGNOSIS — R600 Localized edema: Secondary | ICD-10-CM

## 2022-02-05 DIAGNOSIS — M25662 Stiffness of left knee, not elsewhere classified: Secondary | ICD-10-CM

## 2022-02-05 NOTE — Therapy (Signed)
OUTPATIENT PHYSICAL THERAPY TREATMENT NOTE   Patient Name: Jenna Thompson MRN: [id number] DOB:Nov 06, 1945, 76 y.o., female Today's Date: 02/05/2022  END OF SESSION:   PT End of Session - 02/05/22 1100     Visit Number 3    Number of Visits 24    Date for PT Re-Evaluation 04/19/22    Authorization Type UHC/Medicare    Progress Note Due on Visit 10    PT Start Time 1058    PT Stop Time 1136    PT Time Calculation (min) 38 min    Equipment Utilized During Treatment Gait belt    Activity Tolerance Patient tolerated treatment well    Behavior During Therapy WFL for tasks assessed/performed             Past Medical History:  Diagnosis Date   Anemia, iron deficiency    Arthritis    L>R knee   Atrophic gastritis without mention of hemorrhage    CAD (coronary artery disease)    a. s/p Inferior STEMI in 04/2017 with angioplasty and thrombectomy to mid-RCA, repeat cath later that admission with aspiration thrombectomy of rPDA and residual thrombus along distal vessel too small for intervention   Colon polyps    Diaphragmatic hernia without mention of obstruction or gangrene    Diverticulosis of colon (without mention of hemorrhage)    Hiatal hernia    Hypertension    Unspecified hemorrhoids without mention of complication    Past Surgical History:  Procedure Laterality Date   ABDOMINAL HYSTERECTOMY  1978   CHOLECYSTECTOMY  1979   CORONARY ANGIOGRAPHY N/A 05/02/2017   Procedure: CORONARY ANGIOGRAPHY (CATH LAB);  Surgeon: Yvonne Kendall, MD;  [facility]: [facility];  Service: Cardiovascular;  Laterality: N/A;   CORONARY BALLOON ANGIOPLASTY N/A 05/02/2017   Procedure: CORONARY BALLOON ANGIOPLASTY;  Surgeon: Yvonne Kendall, MD;  Location: [facility];  Service: Cardiovascular;  Laterality: N/A;   CORONARY THROMBECTOMY N/A 05/02/2017   Procedure: Coronary Thrombectomy;  Surgeon: Yvonne Kendall, MD;  Location: [facility];  Service: Cardiovascular;   Laterality: N/A;   CORONARY/GRAFT ACUTE MI REVASCULARIZATION N/A 05/01/2017   Procedure: Coronary/Graft Acute MI Revascularization;  Surgeon: Kathleene Hazel, MD;  Location: [facility];  Service: Cardiovascular;  Laterality: N/A;   KNEE SURGERY  2001   left    LEFT HEART CATH AND CORONARY ANGIOGRAPHY N/A 05/01/2017   Procedure: LEFT HEART CATH AND CORONARY ANGIOGRAPHY;  Surgeon: Kathleene Hazel, MD;  Location: [facility];  Service: Cardiovascular;  Laterality: N/A;   TONSILLECTOMY AND ADENOIDECTOMY  1970   TOTAL KNEE ARTHROPLASTY Left 01/08/2022   Procedure: LEFT TOTAL KNEE ARTHROPLASTY;  Surgeon: Tarry Kos, MD;  Location: [facility];  Service: Orthopedics;  Laterality: Left;   Patient Active Problem List   Diagnosis Date Noted   Primary localized osteoarthritis of left knee 01/09/2022   Status post total left knee replacement 01/08/2022   Primary osteoarthritis of right knee 01/14/2020   Primary osteoarthritis of right hip 01/14/2020   Primary osteoarthritis of left knee 01/14/2020   Hyperlipidemia 05/05/2017   S/P PTCA (percutaneous transluminal coronary angioplasty)    Iron deficiency anemia    Acute myocardial infarction [facility])    Primary osteoarthritis of both knees 12/20/2016   Personal history of colonic polyps 12/26/2010     THERAPY DIAG:  Acute pain of left knee  Stiffness of left knee, not elsewhere classified  Difficulty in walking, not elsewhere classified  Localized edema  PCP: Deatra James, MD   REFERRING PROVIDER: Tarry Kos, MD   REFERRING DIAG: [phone] (ICD-10-CM) - Primary localized osteoarthritis of left knee   THERAPY DIAG:  Acute pain of left knee   Stiffness of left knee, not elsewhere classified   Difficulty in walking, not elsewhere classified   Localized edema   Rationale for Evaluation and Treatment Rehabilitation   ONSET DATE: 01/08/22 left TKA SUBJECTIVE:    SUBJECTIVE STATEMENT: Pt arriving stating she  has more pain and stiffness today in her knee.   PERTINENT HISTORY: 01/08/22 left TKA anemia, arthritis, CAD, diverticulosis, HTN, Coronary/graft acute MI revascularization 2019, abdominla hysterectomy,    PAIN:  NPRS scale: 7/10 Pain location: left knee Pain description: achy, burning, throbbing Aggravating factors: bending, sitting too long Relieving factors: ice, pain meds   PRECAUTIONS: Fall   WEIGHT BEARING RESTRICTIONS No   FALLS:  Has patient fallen in last 6 months? No   LIVING ENVIRONMENT: Lives with: lives with their family Lives in: House/apartment Stairs: Yes: Internal: 1 flight steps; on left going up Has following equipment at home: Walker - 2 wheeled, st cane , raised toilet   OCCUPATION: retired   PLOF: Independent   PATIENT GOALS be able to walk without pain     OBJECTIVE:    DIAGNOSTIC FINDINGS: post op x-ray   PATIENT SURVEYS:  01/22/22: FOTO intake: 47%  predicted:  68%   COGNITION:           Overall cognitive status: WFL                    SENSATION: WFL   EDEMA:  01/22/22:  Circumferential: left: 44.5 centimeters                            Right 39 centimeters       POSTURE: rounded shoulders and forward head   PALPATION: Tenderness noted around entire left knee joint   LOWER EXTREMITY ROM:   A = Active ROM P=passive ROM Right 01/22/22 Left 01/22/22 Left 02/05/22  Hip flexion       Hip extension       Hip abduction       Hip adduction       Hip internal rotation       Hip external rotation       Knee flexion A: 130 A: 78 P: 82 A:95 P:100  Knee extension A: 0 A: -8 P: -6 A:-5 P:  Ankle dorsiflexion       Ankle plantarflexion       Ankle inversion       Ankle eversion        (Blank rows = not tested)   LOWER EXTREMITY MMT:   MMT Right 01/22/22 Left 01/22/22  Hip flexion 5/5 3/5  Hip extension      Hip abduction 5/5 4/5  Hip adduction 5/5 4/5  Hip internal rotation      Hip external rotation      Knee  flexion 5/5 3/5  Knee extension 5/5 3/5  Ankle dorsiflexion      Ankle plantarflexion      Ankle inversion      Ankle eversion       (Blank rows = not tested)     FUNCTIONAL TESTS:  01/22/22: 5 times sit to stand: 27 seconds c UE support   GAIT: Distance walked: 30 feet  Assistive device utilized: Environmental consultant - 2 wheeled  Level of assistance: Modified independence Comments: antalgic step through gait pattern     TODAY'S TREATMENT: 02/05/22 -Nu step L5 X 8 min LE/UE -Seated knee flexion and extension AROM X 15 -Seated knee flexion AAROM 5 sec hold X 15 -Seated LAQ 3# 2X10 -Seated SLR 2X10 -Sit to stand X 10 with UE support and slow lower -Gait with SPC one lap around the gym mod I, gait without AD one lap with supervision -Leg press DL 69# X 15, Leg press SL left only 25# X 10   -Manual therapy: Left knee PROM with overpressure flexion and extension  01/24/22 -Seated knee flexion and extension AROM X 15 -Seated knee flexion AAROM 5 sec hold X 15 -Supine quad set 5 sec X 15 -Supine heelslides AAROM 5 sec X 10 -Seated SLR X10 -Sit to stand X 5 with UE support and slow lower -Nu step L5 X 8 min LE/UE  -Manual therapy: Left knee PROM with overpressure flexion and extension   01/22/22:  Therex:  HEP instruction/performance c cues for techniques, handout provided.  Trial set performed of each for comprehension and symptom assessment.  See below for exercise list Modalities:  Vasopneumatic device : 34 degrees, low compression x 10 minutes       PATIENT EDUCATION:  Education details: HEP, POC Person educated: Patient Education method: Explanation, Demonstration, Verbal cues, and Handouts Education comprehension: verbalized understanding, returned demonstration, and verbal cues required       HOME EXERCISE PROGRAM: Access Code: [email or web address] URL: [email or web address] Date: 01/22/2022 Prepared by: Narda Amber   Exercises - Supine Quad Set  - 4 x daily  - 7 x weekly - 2 sets - 10 reps - 5 seconds hold - Supine Heel Slide with Strap  - 4 x daily - 7 x weekly - 2 sets - 10 reps - 5 seconds hold - Seated Long Arc Quad  - 4 x daily - 7 x weekly - 2 sets - 10 reps - 5 seconds hold - Seated Heel Slide  - 4 x daily - 7 x weekly - 2 sets - 10 reps - Sit to Stand with Counter Support  - 4 x daily - 7 x weekly - 10 reps - Seated Small Alternating Straight Leg Lifts with Heel Touch  - 4 x daily - 7 x weekly - 10 reps   ASSESSMENT:   CLINICAL IMPRESSION: She is doing well with ambulation and looks safe to discharge from [facility]. PT recommends [facility] for community ambulation and instructed her to bring her cane in next visit. She looks safe for short household ambulation without AD. Her knee ROM measurements showed good improvement today. Continue POC.        OBJECTIVE IMPAIRMENTS Abnormal gait, decreased activity tolerance, decreased balance, decreased mobility, difficulty walking, decreased ROM, decreased strength, increased edema, impaired flexibility, and pain.    ACTIVITY LIMITATIONS sitting, standing, squatting, sleeping, stairs, transfers, and bed mobility   PARTICIPATION LIMITATIONS: meal prep, cleaning, laundry, and community activity   PERSONAL FACTORS anemia, arthritis, CAD, diverticulosis, HTN, Coronary/graft acute MI revascularization 2019, abdominla hysterectomy,  are also affecting patient's functional outcome.    REHAB POTENTIAL: Good   CLINICAL DECISION MAKING: Stable/uncomplicated   EVALUATION COMPLEXITY: Low     GOALS: Goals reviewed with patient? Yes   Short term PT Goals (target date for Short term goals are 3 weeks 02/15/22) Patient will demonstrate independent use of home exercise program to maintain progress from in clinic treatments. Goal status: New   Pt will  improve her 5 time to sit to stand to </= 15 seconds with/without UE support Goal Status: New   Long term PT goals (target dates for all long term goals are 12 weeks   04/19/22 )   1. Patient will demonstrate/report pain at worst less than or equal to 2/10 to facilitate minimal limitation in daily activity secondary to pain symptoms. Goal status: New   2. Patient will demonstrate independent use of home exercise program to facilitate ability to maintain/progress functional gains from skilled physical therapy services. Goal status: New   3. Patient will demonstrate FOTO outcome > or = 68 % to indicate reduced disability due to condition. Goal status: New   4.  Patient will demonstrate left  LE MMT 5/5 throughout to faciltiate usual transfers, stairs, squatting at [facility] for daily life.    Goal status: New   5.  pt will be able to navigate 1 flight of stairs with single hand rail with step over step pattern.    Goal status: New   6.  Pt will improve left knee active ROM arc 2-115 degrees.    Goal status: New   7. Pt will amb 500 feet with no device on level surfaces with normalized gait pattern.             A. Goal Status: New           PLAN:   PT FREQUENCY: 2-3 x/week   PT DURATION: 12 weeks   PLANNED INTERVENTIONS: Therapeutic exercises, Therapeutic activity, Neuro Muscular re-education, Balance training, Gait training, Patient/Family education, Joint mobilization, Stair training, DME instructions, Dry Needling, Electrical stimulation, Traction, Cryotherapy, Moist heat, Taping, Ultrasound, Ionotophoresis 4mg /ml Dexamethasone, and Manual therapy.  All included unless contraindicated     PLAN FOR NEXT SESSION: knee ROM, quad strengthening, gait with LRAD, vaso if desired or she can ice at home.   Debbe Odea, PT,DPT 02/05/2022, 11:00 AM

## 2022-02-07 ENCOUNTER — Ambulatory Visit: Payer: Medicare Other | Admitting: Physical Therapy

## 2022-02-07 ENCOUNTER — Encounter: Payer: Self-pay | Admitting: Physical Therapy

## 2022-02-07 DIAGNOSIS — M25662 Stiffness of left knee, not elsewhere classified: Secondary | ICD-10-CM

## 2022-02-07 DIAGNOSIS — R262 Difficulty in walking, not elsewhere classified: Secondary | ICD-10-CM

## 2022-02-07 DIAGNOSIS — R600 Localized edema: Secondary | ICD-10-CM

## 2022-02-07 DIAGNOSIS — M25562 Pain in left knee: Secondary | ICD-10-CM

## 2022-02-07 NOTE — Therapy (Signed)
OUTPATIENT PHYSICAL THERAPY TREATMENT NOTE   Patient Name: Jenna Thompson MRN: [id number] DOB:1945-12-13, 76 y.o., female Today's Date: 02/07/2022  END OF SESSION:   PT End of Session - 02/07/22 1148     Visit Number 4    Number of Visits 24    Date for PT Re-Evaluation 04/19/22    Authorization Type UHC/Medicare    Progress Note Due on Visit 10    PT Start Time 1147    PT Stop Time 1225    PT Time Calculation (min) 38 min    Equipment Utilized During Treatment Gait belt    Activity Tolerance Patient tolerated treatment well    Behavior During Therapy WFL for tasks assessed/performed             Past Medical History:  Diagnosis Date   Anemia, iron deficiency    Arthritis    L>R knee   Atrophic gastritis without mention of hemorrhage    CAD (coronary artery disease)    a. s/p Inferior STEMI in 04/2017 with angioplasty and thrombectomy to mid-RCA, repeat cath later that admission with aspiration thrombectomy of rPDA and residual thrombus along distal vessel too small for intervention   Colon polyps    Diaphragmatic hernia without mention of obstruction or gangrene    Diverticulosis of colon (without mention of hemorrhage)    Hiatal hernia    Hypertension    Unspecified hemorrhoids without mention of complication    Past Surgical History:  Procedure Laterality Date   ABDOMINAL HYSTERECTOMY  1978   CHOLECYSTECTOMY  1979   CORONARY ANGIOGRAPHY N/A 05/02/2017   Procedure: CORONARY ANGIOGRAPHY (CATH LAB);  Surgeon: Nelva Bush, MD;  Location: [place] [facility];  Service: Cardiovascular;  Laterality: N/A;   CORONARY BALLOON ANGIOPLASTY N/A 05/02/2017   Procedure: CORONARY BALLOON ANGIOPLASTY;  Surgeon: Nelva Bush, MD;  Location: [place] [facility];  Service: Cardiovascular;  Laterality: N/A;   CORONARY THROMBECTOMY N/A 05/02/2017   Procedure: Coronary Thrombectomy;  Surgeon: Nelva Bush, MD;  Location: [place] [facility];  Service: Cardiovascular;   Laterality: N/A;   CORONARY/GRAFT ACUTE MI REVASCULARIZATION N/A 05/01/2017   Procedure: Coronary/Graft Acute MI Revascularization;  Surgeon: Burnell Blanks, MD;  Location: [place] [facility];  Service: Cardiovascular;  Laterality: N/A;   KNEE SURGERY  2001   left    LEFT HEART CATH AND CORONARY ANGIOGRAPHY N/A 05/01/2017   Procedure: LEFT HEART CATH AND CORONARY ANGIOGRAPHY;  Surgeon: Burnell Blanks, MD;  Location: [place] [facility];  Service: Cardiovascular;  Laterality: N/A;   TONSILLECTOMY AND ADENOIDECTOMY  1970   TOTAL KNEE ARTHROPLASTY Left 01/08/2022   Procedure: LEFT TOTAL KNEE ARTHROPLASTY;  Surgeon: Leandrew Koyanagi, MD;  Location: [place];  Service: Orthopedics;  Laterality: Left;   Patient Active Problem List   Diagnosis Date Noted   Primary localized osteoarthritis of left knee 01/09/2022   Status post total left knee replacement 01/08/2022   Primary osteoarthritis of right knee 01/14/2020   Primary osteoarthritis of right hip 01/14/2020   Primary osteoarthritis of left knee 01/14/2020   Hyperlipidemia 05/05/2017   S/P PTCA (percutaneous transluminal coronary angioplasty)    Iron deficiency anemia    Acute myocardial infarction [facility])    Primary osteoarthritis of both knees 12/20/2016   Personal history of colonic polyps 12/26/2010     THERAPY DIAG:  Acute pain of left knee  Stiffness of left knee, not elsewhere classified  Difficulty in walking, not elsewhere classified  Localized edema  PCP: Deatra James, MD   REFERRING PROVIDER: Tarry Kos, MD   REFERRING DIAG: [phone] (ICD-10-CM) - Primary localized osteoarthritis of left knee   THERAPY DIAG:  Acute pain of left knee   Stiffness of left knee, not elsewhere classified   Difficulty in walking, not elsewhere classified   Localized edema   Rationale for Evaluation and Treatment Rehabilitation   ONSET DATE: 01/08/22 left TKA SUBJECTIVE:    SUBJECTIVE STATEMENT: Pt arriving stating her  knee pain is okay today   PERTINENT HISTORY: 01/08/22 left TKA anemia, arthritis, CAD, diverticulosis, HTN, Coronary/graft acute MI revascularization 2019, abdominla hysterectomy,    PAIN:  NPRS scale: 3/10 Pain location: left knee Pain description: achy, burning, throbbing Aggravating factors: bending, sitting too long Relieving factors: ice, pain meds   PRECAUTIONS: Fall   WEIGHT BEARING RESTRICTIONS No   FALLS:  Has patient fallen in last 6 months? No   LIVING ENVIRONMENT: Lives with: lives with their family Lives in: House/apartment Stairs: Yes: Internal: 1 flight steps; on left going up Has following equipment at home: Walker - 2 wheeled, st cane , raised toilet   OCCUPATION: retired   PLOF: Independent   PATIENT GOALS be able to walk without pain     OBJECTIVE:    DIAGNOSTIC FINDINGS: post op x-ray   PATIENT SURVEYS:  01/22/22: FOTO intake: 47%  predicted:  68%   COGNITION:           Overall cognitive status: WFL                    SENSATION: WFL   EDEMA:  01/22/22:  Circumferential: left: 44.5 centimeters                            Right 39 centimeters       POSTURE: rounded shoulders and forward head   PALPATION: Tenderness noted around entire left knee joint   LOWER EXTREMITY ROM:   A = Active ROM P=passive ROM Right 01/22/22 Left 01/22/22 Left 02/05/22  Hip flexion       Hip extension       Hip abduction       Hip adduction       Hip internal rotation       Hip external rotation       Knee flexion A: 130 A: 78 P: 82 A:95 P:100  Knee extension A: 0 A: -8 P: -6 A:-5 P:  Ankle dorsiflexion       Ankle plantarflexion       Ankle inversion       Ankle eversion        (Blank rows = not tested)   LOWER EXTREMITY MMT:   MMT Right 01/22/22 Left 01/22/22  Hip flexion 5/5 3/5  Hip extension      Hip abduction 5/5 4/5  Hip adduction 5/5 4/5  Hip internal rotation      Hip external rotation      Knee flexion 5/5 3/5  Knee  extension 5/5 3/5  Ankle dorsiflexion      Ankle plantarflexion      Ankle inversion      Ankle eversion       (Blank rows = not tested)     FUNCTIONAL TESTS:  01/22/22: 5 times sit to stand: 27 seconds c UE support   GAIT: Distance walked: 30 feet  Assistive device utilized: Walker - 2 wheeled Level of assistance: Modified  independence Comments: antalgic step through gait pattern     TODAY'S TREATMENT: 02/07/22 -Nu step L6 X 10 min LE/UE -Seated knee flexion AAROM 5 sec hold X 15 -Seated LAQ 3# 2X15 -Seated SLR 2X10 -Sit to stand X 10 with UE support from thighs with slow lower X 10 -Sidestepping at counter top no UE support 3 round trips -Retrowalking at counter top no UE support 3 round trips -March walking at counter top one UE support 2 round tirps -gait without AD one lap with supervision -Leg press DL 92# X 15, Leg press SL left only 25# X 10   -Manual therapy: Left knee PROM with overpressure flexion and extension      PATIENT EDUCATION:  Education details: HEP, POC Person educated: Patient Education method: Explanation, Demonstration, Verbal cues, and Handouts Education comprehension: verbalized understanding, returned demonstration, and verbal cues required       HOME EXERCISE PROGRAM: Access Code: [email or web address] URL: [email or web address] Date: 01/22/2022 Prepared by: Narda Amber   Exercises - Supine Quad Set  - 4 x daily - 7 x weekly - 2 sets - 10 reps - 5 seconds hold - Supine Heel Slide with Strap  - 4 x daily - 7 x weekly - 2 sets - 10 reps - 5 seconds hold - Seated Long Arc Quad  - 4 x daily - 7 x weekly - 2 sets - 10 reps - 5 seconds hold - Seated Heel Slide  - 4 x daily - 7 x weekly - 2 sets - 10 reps - Sit to Stand with Counter Support  - 4 x daily - 7 x weekly - 10 reps - Seated Small Alternating Straight Leg Lifts with Heel Touch  - 4 x daily - 7 x weekly - 10 reps   ASSESSMENT:   CLINICAL IMPRESSION: She is doing well and  able to ambulate in clinic with AD but does have her [facility] with her for community ambulation which is still recommended at this time. She had good tolerance to session today without complaints.        OBJECTIVE IMPAIRMENTS Abnormal gait, decreased activity tolerance, decreased balance, decreased mobility, difficulty walking, decreased ROM, decreased strength, increased edema, impaired flexibility, and pain.    ACTIVITY LIMITATIONS sitting, standing, squatting, sleeping, stairs, transfers, and bed mobility   PARTICIPATION LIMITATIONS: meal prep, cleaning, laundry, and community activity   PERSONAL FACTORS anemia, arthritis, CAD, diverticulosis, HTN, Coronary/graft acute MI revascularization 2019, abdominla hysterectomy,  are also affecting patient's functional outcome.    REHAB POTENTIAL: Good   CLINICAL DECISION MAKING: Stable/uncomplicated   EVALUATION COMPLEXITY: Low     GOALS: Goals reviewed with patient? Yes   Short term PT Goals (target date for Short term goals are 3 weeks 02/15/22) Patient will demonstrate independent use of home exercise program to maintain progress from in clinic treatments. Goal status: New   Pt will improve her 5 time to sit to stand to </= 15 seconds with/without UE support Goal Status: New   Long term PT goals (target dates for all long term goals are 12 weeks  04/19/22 )   1. Patient will demonstrate/report pain at worst less than or equal to 2/10 to facilitate minimal limitation in daily activity secondary to pain symptoms. Goal status: New   2. Patient will demonstrate independent use of home exercise program to facilitate ability to maintain/progress functional gains from skilled physical therapy services. Goal status: New   3. Patient will demonstrate FOTO outcome > or = 68 %  to indicate reduced disability due to condition. Goal status: New   4.  Patient will demonstrate left  LE MMT 5/5 throughout to faciltiate usual transfers, stairs, squatting  at [facility] for daily life.    Goal status: New   5.  pt will be able to navigate 1 flight of stairs with single hand rail with step over step pattern.    Goal status: New   6.  Pt will improve left knee active ROM arc 2-115 degrees.    Goal status: New   7. Pt will amb 500 feet with no device on level surfaces with normalized gait pattern.             A. Goal Status: New           PLAN:   PT FREQUENCY: 2-3 x/week   PT DURATION: 12 weeks   PLANNED INTERVENTIONS: Therapeutic exercises, Therapeutic activity, Neuro Muscular re-education, Balance training, Gait training, Patient/Family education, Joint mobilization, Stair training, DME instructions, Dry Needling, Electrical stimulation, Traction, Cryotherapy, Moist heat, Taping, Ultrasound, Ionotophoresis 4mg /ml Dexamethasone, and Manual therapy.  All included unless contraindicated     PLAN FOR NEXT SESSION: knee ROM, quad strengthening, gait with LRAD, vaso if desired or she can ice at home.   , PT,DPT 02/07/2022, 11:49 AM

## 2022-02-12 ENCOUNTER — Ambulatory Visit: Payer: Medicare Other | Admitting: Physical Therapy

## 2022-02-12 ENCOUNTER — Encounter: Payer: Self-pay | Admitting: Physical Therapy

## 2022-02-12 DIAGNOSIS — R600 Localized edema: Secondary | ICD-10-CM

## 2022-02-12 DIAGNOSIS — M25662 Stiffness of left knee, not elsewhere classified: Secondary | ICD-10-CM

## 2022-02-12 DIAGNOSIS — R262 Difficulty in walking, not elsewhere classified: Secondary | ICD-10-CM

## 2022-02-12 DIAGNOSIS — M25562 Pain in left knee: Secondary | ICD-10-CM

## 2022-02-12 NOTE — Therapy (Signed)
OUTPATIENT PHYSICAL THERAPY TREATMENT NOTE   Patient Name: Jenna Thompson MRN: [id number] DOB:06-May-1945, 76 y.o., female Today's Date: 02/12/2022  END OF SESSION:   PT End of Session - 02/12/22 1111     Visit Number 5    Number of Visits 24    Date for PT Re-Evaluation 04/19/22    Authorization Type UHC/Medicare    Progress Note Due on Visit 10    PT Start Time 1104    PT Stop Time 1142    PT Time Calculation (min) 38 min    Equipment Utilized During Treatment Gait belt    Activity Tolerance Patient tolerated treatment well    Behavior During Therapy WFL for tasks assessed/performed             Past Medical History:  Diagnosis Date   Anemia, iron deficiency    Arthritis    L>R knee   Atrophic gastritis without mention of hemorrhage    CAD (coronary artery disease)    a. s/p Inferior STEMI in 04/2017 with angioplasty and thrombectomy to mid-RCA, repeat cath later that admission with aspiration thrombectomy of rPDA and residual thrombus along distal vessel too small for intervention   Colon polyps    Diaphragmatic hernia without mention of obstruction or gangrene    Diverticulosis of colon (without mention of hemorrhage)    Hiatal hernia    Hypertension    Unspecified hemorrhoids without mention of complication    Past Surgical History:  Procedure Laterality Date   ABDOMINAL HYSTERECTOMY  [place]   CORONARY ANGIOGRAPHY N/A 05/02/2017   Procedure: CORONARY ANGIOGRAPHY (CATH LAB);  Surgeon: Nelva Bush, MD;  Location: [place] [facility];  Service: Cardiovascular;  Laterality: N/A;   CORONARY BALLOON ANGIOPLASTY N/A 05/02/2017   Procedure: CORONARY BALLOON ANGIOPLASTY;  Surgeon: Nelva Bush, MD;  Location: [place] [facility];  Service: Cardiovascular;  Laterality: N/A;   CORONARY THROMBECTOMY N/A 05/02/2017   Procedure: Coronary Thrombectomy;  Surgeon: Nelva Bush, MD;  Location: [place] [facility];  Service: Cardiovascular;   Laterality: N/A;   CORONARY/GRAFT ACUTE MI REVASCULARIZATION N/A 05/01/2017   Procedure: Coronary/Graft Acute MI Revascularization;  Surgeon: Burnell Blanks, MD;  Location: [place] [facility];  Service: Cardiovascular;  Laterality: N/A;   KNEE SURGERY  2001   left    LEFT HEART CATH AND CORONARY ANGIOGRAPHY N/A 05/01/2017   Procedure: LEFT HEART CATH AND CORONARY ANGIOGRAPHY;  Surgeon: Burnell Blanks, MD;  Location: [place] [facility];  Service: Cardiovascular;  Laterality: N/A;   TONSILLECTOMY AND ADENOIDECTOMY  1970   TOTAL KNEE ARTHROPLASTY Left 01/08/2022   Procedure: LEFT TOTAL KNEE ARTHROPLASTY;  Surgeon: Leandrew Koyanagi, MD;  Location: [place];  Service: Orthopedics;  Laterality: Left;   Patient Active Problem List   Diagnosis Date Noted   Primary localized osteoarthritis of left knee 01/09/2022   Status post total left knee replacement 01/08/2022   Primary osteoarthritis of right knee 01/14/2020   Primary osteoarthritis of right hip 01/14/2020   Primary osteoarthritis of left knee 01/14/2020   Hyperlipidemia 05/05/2017   S/P PTCA (percutaneous transluminal coronary angioplasty)    Iron deficiency anemia    Acute myocardial infarction [facility])    Primary osteoarthritis of both knees 12/20/2016   Personal history of colonic polyps 12/26/2010     THERAPY DIAG:  Acute pain of left knee  Stiffness of left knee, not elsewhere classified  Difficulty in walking, not elsewhere classified  Localized edema  PCP: Donald Prose, MD   REFERRING PROVIDER: Leandrew Koyanagi, MD   REFERRING DIAG: [phone] (ICD-10-CM) - Primary localized osteoarthritis of left knee   THERAPY DIAG:  Acute pain of left knee   Stiffness of left knee, not elsewhere classified   Difficulty in walking, not elsewhere classified   Localized edema   Rationale for Evaluation and Treatment Rehabilitation   ONSET DATE: 01/08/22 left TKA SUBJECTIVE:    SUBJECTIVE STATEMENT: Pt arriving stating her  knee pain is okay today   PERTINENT HISTORY: 01/08/22 left TKA anemia, arthritis, CAD, diverticulosis, HTN, Coronary/graft acute MI revascularization 2019, abdominla hysterectomy,    PAIN:  NPRS scale: 3/10 Pain location: left knee Pain description: achy, burning, throbbing Aggravating factors: bending, sitting too long Relieving factors: ice, pain meds   PRECAUTIONS: Fall   WEIGHT BEARING RESTRICTIONS No   FALLS:  Has patient fallen in last 6 months? No   LIVING ENVIRONMENT: Lives with: lives with their family Lives in: House/apartment Stairs: Yes: Internal: 1 flight steps; on left going up Has following equipment at home: Walker - 2 wheeled, st cane , raised toilet   OCCUPATION: retired   PLOF: Independent   PATIENT GOALS be able to walk without pain     OBJECTIVE:    DIAGNOSTIC FINDINGS: post op x-ray   PATIENT SURVEYS:  01/22/22: FOTO intake: 47%  predicted:  68%   COGNITION:           Overall cognitive status: WFL                    SENSATION: WFL   EDEMA:  01/22/22:  Circumferential: left: 44.5 centimeters                            Right 39 centimeters       POSTURE: rounded shoulders and forward head   PALPATION: Tenderness noted around entire left knee joint   LOWER EXTREMITY ROM:   A = Active ROM P=passive ROM Right 01/22/22 Left 01/22/22 Left 02/05/22  Hip flexion       Hip extension       Hip abduction       Hip adduction       Hip internal rotation       Hip external rotation       Knee flexion A: 130 A: 78 P: 82 A:95 P:100  Knee extension A: 0 A: -8 P: -6 A:-5 P:  Ankle dorsiflexion       Ankle plantarflexion       Ankle inversion       Ankle eversion        (Blank rows = not tested)   LOWER EXTREMITY MMT:   MMT Right 01/22/22 Left 01/22/22 Left 02/12/22  Hip flexion 5/5 3/5   Hip extension       Hip abduction 5/5 4/5   Hip adduction 5/5 4/5   Hip internal rotation       Hip external rotation       Knee  flexion 5/5 3/5 4/5  Knee extension 5/5 3/5 4/5  Ankle dorsiflexion       Ankle plantarflexion       Ankle inversion       Ankle eversion        (Blank rows = not tested)     FUNCTIONAL TESTS:  01/22/22: 5 times sit to stand: 27 seconds c UE support 02/12/22: 5 times STS 16.6  seconds with UE support   GAIT: Distance walked: 30 feet  Assistive device utilized: Walker - 2 wheeled Level of assistance: Modified independence Comments: antalgic step through gait pattern     TODAY'S TREATMENT: 02/12/22 -Recumbent bike L1 X 10  min seat #7 -Seated knee flexion AAROM 5 sec hold X 15 -Seated leg extension machine 5# DL 3X10 -Seated hamstring curl machine 25# 2X15 -Seated SLR 2X10 -5 times sit to stand 16.6 seconds with UE support -gait without AD one lap with supervision -Leg press DL 50# X 15, Leg press SL left only 25# X 10   02/07/22 -Nu step L6 X 10 min LE/UE -Seated knee flexion AAROM 5 sec hold X 15 -Seated LAQ 3# 2X15 -Seated SLR 2X10 -Sit to stand X 10 with UE support from thighs with slow lower X 10 -Sidestepping at counter top no UE support 3 round trips -Retrowalking at counter top no UE support 3 round trips -March walking at counter top one UE support 2 round tirps -gait without AD one lap with supervision -Leg press DL 50# X 15, Leg press SL left only 25# X 10   -Manual therapy: Left knee PROM with overpressure flexion and extension      PATIENT EDUCATION:  Education details: HEP, POC Person educated: Patient Education method: Explanation, Demonstration, Verbal cues, and Handouts Education comprehension: verbalized understanding, returned demonstration, and verbal cues required       HOME EXERCISE PROGRAM: Access Code: [email or web address] URL: [email or web address] Date: 01/22/2022 Prepared by: Kearney Hard   Exercises - Supine Quad Set  - 4 x daily - 7 x weekly - 2 sets - 10 reps - 5 seconds hold - Supine Heel Slide with Strap  - 4 x daily -  7 x weekly - 2 sets - 10 reps - 5 seconds hold - Seated Long Arc Quad  - 4 x daily - 7 x weekly - 2 sets - 10 reps - 5 seconds hold - Seated Heel Slide  - 4 x daily - 7 x weekly - 2 sets - 10 reps - Sit to Stand with Counter Support  - 4 x daily - 7 x weekly - 10 reps - Seated Small Alternating Straight Leg Lifts with Heel Touch  - 4 x daily - 7 x weekly - 10 reps   ASSESSMENT:   CLINICAL IMPRESSION: Her strength and left knee power have improved with updated MMT and 5 times sit to stand testing today. ROM is also progressing well. She will continue to benefit from skilled PT       OBJECTIVE IMPAIRMENTS Abnormal gait, decreased activity tolerance, decreased balance, decreased mobility, difficulty walking, decreased ROM, decreased strength, increased edema, impaired flexibility, and pain.    ACTIVITY LIMITATIONS sitting, standing, squatting, sleeping, stairs, transfers, and bed mobility   PARTICIPATION LIMITATIONS: meal prep, cleaning, laundry, and community activity   PERSONAL FACTORS anemia, arthritis, CAD, diverticulosis, HTN, Coronary/graft acute MI revascularization 2019, abdominla hysterectomy,  are also affecting patient's functional outcome.    REHAB POTENTIAL: Good   CLINICAL DECISION MAKING: Stable/uncomplicated   EVALUATION COMPLEXITY: Low     GOALS: Goals reviewed with patient? Yes   Short term PT Goals (target date for Short term goals are 3 weeks 02/15/22) Patient will demonstrate independent use of home exercise program to maintain progress from in clinic treatments. Goal status: MET 02/12/22   Pt will improve her 5 time to sit to stand to </= 15 seconds with/without UE support Goal Status: New   Long  term PT goals (target dates for all long term goals are 12 weeks  04/19/22 )   1. Patient will demonstrate/report pain at worst less than or equal to 2/10 to facilitate minimal limitation in daily activity secondary to pain symptoms. Goal status: ongoing   2.  Patient will demonstrate independent use of home exercise program to facilitate ability to maintain/progress functional gains from skilled physical therapy services. Goal status: ongoing   3. Patient will demonstrate FOTO outcome > or = 68 % to indicate reduced disability due to condition. Goal status: ongoing   4.  Patient will demonstrate left  LE MMT 5/5 throughout to faciltiate usual transfers, stairs, squatting at [facility] for daily life.    Goal status: ongoing   5.  pt will be able to navigate 1 flight of stairs with single hand rail with step over step pattern.    Goal status: ongoing   6.  Pt will improve left knee active ROM arc 2-115 degrees.    Goal status: ongoing   7. Pt will amb 500 feet with no device on level surfaces with normalized gait pattern.             A. Goal Status: ongoing           PLAN:   PT FREQUENCY: 2-3 x/week   PT DURATION: 12 weeks   PLANNED INTERVENTIONS: Therapeutic exercises, Therapeutic activity, Neuro Muscular re-education, Balance training, Gait training, Patient/Family education, Joint mobilization, Stair training, DME instructions, Dry Needling, Electrical stimulation, Traction, Cryotherapy, Moist heat, Taping, Ultrasound, Ionotophoresis 16m/ml Dexamethasone, and Manual therapy.  All included unless contraindicated     PLAN FOR NEXT SESSION: knee ROM, quad strengthening, gait with LRAD  BDebbe Odea PT,DPT 02/12/2022, 11:12 AM

## 2022-02-14 ENCOUNTER — Ambulatory Visit: Payer: Medicare Other | Admitting: Physical Therapy

## 2022-02-14 ENCOUNTER — Encounter: Payer: Self-pay | Admitting: Physical Therapy

## 2022-02-14 DIAGNOSIS — R262 Difficulty in walking, not elsewhere classified: Secondary | ICD-10-CM | POA: Diagnosis not present

## 2022-02-14 DIAGNOSIS — M25662 Stiffness of left knee, not elsewhere classified: Secondary | ICD-10-CM | POA: Diagnosis not present

## 2022-02-14 DIAGNOSIS — M25562 Pain in left knee: Secondary | ICD-10-CM | POA: Diagnosis not present

## 2022-02-14 DIAGNOSIS — R600 Localized edema: Secondary | ICD-10-CM | POA: Diagnosis not present

## 2022-02-14 NOTE — Therapy (Signed)
OUTPATIENT PHYSICAL THERAPY TREATMENT NOTE   Patient Name: Jenna Thompson MRN: [id number] DOB:1945-07-31, 76 y.o., female Today's Date: 02/14/2022  END OF SESSION:   PT End of Session - 02/14/22 1149     Visit Number 6    Number of Visits 24    Date for PT Re-Evaluation 04/19/22    Authorization Type UHC/Medicare    Progress Note Due on Visit 10    PT Start Time 1148    PT Stop Time 1226    PT Time Calculation (min) 38 min    Equipment Utilized During Treatment Gait belt    Activity Tolerance Patient tolerated treatment well    Behavior During Therapy WFL for tasks assessed/performed             Past Medical History:  Diagnosis Date   Anemia, iron deficiency    Arthritis    L>R knee   Atrophic gastritis without mention of hemorrhage    CAD (coronary artery disease)    a. s/p Inferior STEMI in 04/2017 with angioplasty and thrombectomy to mid-RCA, repeat cath later that admission with aspiration thrombectomy of rPDA and residual thrombus along distal vessel too small for intervention   Colon polyps    Diaphragmatic hernia without mention of obstruction or gangrene    Diverticulosis of colon (without mention of hemorrhage)    Hiatal hernia    Hypertension    Unspecified hemorrhoids without mention of complication    Past Surgical History:  Procedure Laterality Date   ABDOMINAL HYSTERECTOMY  1978   CHOLECYSTECTOMY  1979   CORONARY ANGIOGRAPHY N/A 05/02/2017   Procedure: CORONARY ANGIOGRAPHY (CATH LAB);  Surgeon: Nelva Bush, MD;  Location: [place] [facility];  Service: Cardiovascular;  Laterality: N/A;   CORONARY BALLOON ANGIOPLASTY N/A 05/02/2017   Procedure: CORONARY BALLOON ANGIOPLASTY;  Surgeon: Nelva Bush, MD;  Location: [place] [facility];  Service: Cardiovascular;  Laterality: N/A;   CORONARY THROMBECTOMY N/A 05/02/2017   Procedure: Coronary Thrombectomy;  Surgeon: Nelva Bush, MD;  Location: [place] [facility];  Service: Cardiovascular;  Laterality:  N/A;   CORONARY/GRAFT ACUTE MI REVASCULARIZATION N/A 05/01/2017   Procedure: Coronary/Graft Acute MI Revascularization;  Surgeon: Burnell Blanks, MD;  Location: [place] [facility];  Service: Cardiovascular;  Laterality: N/A;   KNEE SURGERY  2001   left    LEFT HEART CATH AND CORONARY ANGIOGRAPHY N/A 05/01/2017   Procedure: LEFT HEART CATH AND CORONARY ANGIOGRAPHY;  Surgeon: Burnell Blanks, MD;  Location: [place] [facility];  Service: Cardiovascular;  Laterality: N/A;   TONSILLECTOMY AND ADENOIDECTOMY  1970   TOTAL KNEE ARTHROPLASTY Left 01/08/2022   Procedure: LEFT TOTAL KNEE ARTHROPLASTY;  Surgeon: Leandrew Koyanagi, MD;  Location: [place];  Service: Orthopedics;  Laterality: Left;   Patient Active Problem List   Diagnosis Date Noted   Primary localized osteoarthritis of left knee 01/09/2022   Status post total left knee replacement 01/08/2022   Primary osteoarthritis of right knee 01/14/2020   Primary osteoarthritis of right hip 01/14/2020   Primary osteoarthritis of left knee 01/14/2020   Hyperlipidemia 05/05/2017   S/P PTCA (percutaneous transluminal coronary angioplasty)    Iron deficiency anemia    Acute myocardial infarction [facility])    Primary osteoarthritis of both knees 12/20/2016   Personal history of colonic polyps 12/26/2010     THERAPY DIAG:  Acute pain of left knee  Stiffness of left knee, not elsewhere classified  Difficulty in walking, not elsewhere classified  Localized edema  PCP: Donald Prose, MD   REFERRING PROVIDER: Leandrew Koyanagi, MD   REFERRING DIAG: [phone] (ICD-10-CM) - Primary localized osteoarthritis of left knee   Rationale for Evaluation and Treatment Rehabilitation   ONSET DATE: 01/08/22 left TKA SUBJECTIVE:    SUBJECTIVE STATEMENT: Pt arriving stating not much pain in her leg today.   PERTINENT HISTORY: 01/08/22 left TKA anemia, arthritis, CAD, diverticulosis, HTN, Coronary/graft acute MI revascularization 2019, abdominla  hysterectomy,    PAIN:  NPRS scale: 1/10 Pain location: left knee Pain description: achy, burning, throbbing Aggravating factors: bending, sitting too long Relieving factors: ice, pain meds   PRECAUTIONS: Fall   WEIGHT BEARING RESTRICTIONS No   FALLS:  Has patient fallen in last 6 months? No   LIVING ENVIRONMENT: Lives with: lives with their family Lives in: House/apartment Stairs: Yes: Internal: 1 flight steps; on left going up Has following equipment at home: Walker - 2 wheeled, st cane , raised toilet   OCCUPATION: retired   PLOF: Independent   PATIENT GOALS be able to walk without pain     OBJECTIVE:    DIAGNOSTIC FINDINGS: post op x-ray   PATIENT SURVEYS:  01/22/22: FOTO intake: 47%  predicted:  68%   COGNITION:           Overall cognitive status: WFL                    SENSATION: WFL   EDEMA:  01/22/22:  Circumferential: left: 44.5 centimeters                            Right 39 centimeters       POSTURE: rounded shoulders and forward head   PALPATION: Tenderness noted around entire left knee joint   LOWER EXTREMITY ROM:   A = Active ROM P=passive ROM Right 01/22/22 Left 01/22/22 Left 02/05/22  Hip flexion       Hip extension       Hip abduction       Hip adduction       Hip internal rotation       Hip external rotation       Knee flexion A: 130 A: 78 P: 82 A:95 P:100  Knee extension A: 0 A: -8 P: -6 A:-5 P:  Ankle dorsiflexion       Ankle plantarflexion       Ankle inversion       Ankle eversion        (Blank rows = not tested)   LOWER EXTREMITY MMT:   MMT Right 01/22/22 Left 01/22/22 Left 02/12/22  Hip flexion 5/5 3/5   Hip extension       Hip abduction 5/5 4/5   Hip adduction 5/5 4/5   Hip internal rotation       Hip external rotation       Knee flexion 5/5 3/5 4/5  Knee extension 5/5 3/5 4/5  Ankle dorsiflexion       Ankle plantarflexion       Ankle inversion       Ankle eversion        (Blank rows = not  tested)     FUNCTIONAL TESTS:  01/22/22: 5 times sit to stand: 27 seconds c UE support 02/12/22: 5 times STS 16.6 seconds with UE support   GAIT: Distance walked: 30 feet  Assistive device utilized: Walker - 2 wheeled Level of assistance: Modified independence Comments: antalgic step through gait pattern  TODAY'S TREATMENT: 02/14/22 -Recumbent bike L3 X 8  min seat #7 -Seated knee flexion AAROM 5 sec hold X 15 -Seated leg extension machine 10# DL 3X10 -Seated hamstring curl machine 25# 2X15 -Step ups onto 6 inch step X 10 fwd and X 10 lateral leading with Lt, one UE support -Seated SLR 2X10 -Sit to stand without UE support from raised mat X 10 -Leg press DL 62# 2X10, Leg press SL left only 31# X 15  02/12/22 -Recumbent bike L1 X 10  min seat #7 -Seated knee flexion AAROM 5 sec hold X 15 -Seated leg extension machine 5# DL 3X10 -Seated hamstring curl machine 25# 2X15 -Seated SLR 2X10 -5 times sit to stand 16.6 seconds with UE support -gait without AD one lap with supervision -Leg press DL 50# X 15, Leg press SL left only 25# X 10      PATIENT EDUCATION:  Education details: HEP, POC Person educated: Patient Education method: Consulting civil engineer, Demonstration, Verbal cues, and Handouts Education comprehension: verbalized understanding, returned demonstration, and verbal cues required       HOME EXERCISE PROGRAM: Access Code: [email or web address] URL: [email or web address] Date: 01/22/2022 Prepared by: Kearney Hard   Exercises - Supine Quad Set  - 4 x daily - 7 x weekly - 2 sets - 10 reps - 5 seconds hold - Supine Heel Slide with Strap  - 4 x daily - 7 x weekly - 2 sets - 10 reps - 5 seconds hold - Seated Long Arc Quad  - 4 x daily - 7 x weekly - 2 sets - 10 reps - 5 seconds hold - Seated Heel Slide  - 4 x daily - 7 x weekly - 2 sets - 10 reps - Sit to Stand with Counter Support  - 4 x daily - 7 x weekly - 10 reps - Seated Small Alternating Straight Leg Lifts with  Heel Touch  - 4 x daily - 7 x weekly - 10 reps   ASSESSMENT:   CLINICAL IMPRESSION: We continued to work to improve her overall leg strength and functional abilities post op TKA. She had good tolerance to session.        OBJECTIVE IMPAIRMENTS Abnormal gait, decreased activity tolerance, decreased balance, decreased mobility, difficulty walking, decreased ROM, decreased strength, increased edema, impaired flexibility, and pain.    ACTIVITY LIMITATIONS sitting, standing, squatting, sleeping, stairs, transfers, and bed mobility   PARTICIPATION LIMITATIONS: meal prep, cleaning, laundry, and community activity   PERSONAL FACTORS anemia, arthritis, CAD, diverticulosis, HTN, Coronary/graft acute MI revascularization 2019, abdominla hysterectomy,  are also affecting patient's functional outcome.    REHAB POTENTIAL: Good   CLINICAL DECISION MAKING: Stable/uncomplicated   EVALUATION COMPLEXITY: Low     GOALS: Goals reviewed with patient? Yes   Short term PT Goals (target date for Short term goals are 3 weeks 02/15/22) Patient will demonstrate independent use of home exercise program to maintain progress from in clinic treatments. Goal status: MET 02/12/22   Pt will improve her 5 time to sit to stand to </= 15 seconds with/without UE support Goal Status: New   Long term PT goals (target dates for all long term goals are 12 weeks  04/19/22 )   1. Patient will demonstrate/report pain at worst less than or equal to 2/10 to facilitate minimal limitation in daily activity secondary to pain symptoms. Goal status: ongoing   2. Patient will demonstrate independent use of home exercise program to facilitate ability to maintain/progress functional gains from skilled physical therapy services.  Goal status: ongoing   3. Patient will demonstrate FOTO outcome > or = 68 % to indicate reduced disability due to condition. Goal status: ongoing   4.  Patient will demonstrate left  LE MMT 5/5 throughout  to faciltiate usual transfers, stairs, squatting at [facility] for daily life.    Goal status: ongoing   5.  pt will be able to navigate 1 flight of stairs with single hand rail with step over step pattern.    Goal status: ongoing   6.  Pt will improve left knee active ROM arc 2-115 degrees.    Goal status: ongoing   7. Pt will amb 500 feet with no device on level surfaces with normalized gait pattern.             A. Goal Status: ongoing           PLAN:   PT FREQUENCY: 2-3 x/week   PT DURATION: 12 weeks   PLANNED INTERVENTIONS: Therapeutic exercises, Therapeutic activity, Neuro Muscular re-education, Balance training, Gait training, Patient/Family education, Joint mobilization, Stair training, DME instructions, Dry Needling, Electrical stimulation, Traction, Cryotherapy, Moist heat, Taping, Ultrasound, Ionotophoresis 59m/ml Dexamethasone, and Manual therapy.  All included unless contraindicated     PLAN FOR NEXT SESSION: knee ROM, quad strengthening, gait with LRAD  BDebbe Odea PT,DPT 02/14/2022, 11:53 AM

## 2022-02-18 ENCOUNTER — Other Ambulatory Visit: Payer: Self-pay | Admitting: Physician Assistant

## 2022-02-18 ENCOUNTER — Telehealth: Payer: Self-pay | Admitting: *Deleted

## 2022-02-18 MED ORDER — HYDROCODONE-ACETAMINOPHEN 5-325 MG PO TABS: 1.0000 | ORAL_TABLET | Freq: Two times a day (BID) | ORAL | 0 refills | Status: DC | PRN

## 2022-02-18 NOTE — Telephone Encounter (Signed)
Sent in [place]

## 2022-02-18 NOTE — Telephone Encounter (Signed)
Patient called and requested refill of pain medication. Attending therapy and doing well. Overdid it last week with therapy she states and having some increased pain. Pharmacy-[facility] [place] [facility]. Thanks.

## 2022-02-19 ENCOUNTER — Encounter: Payer: Self-pay | Admitting: Physical Therapy

## 2022-02-19 ENCOUNTER — Ambulatory Visit: Payer: Medicare Other | Admitting: Physical Therapy

## 2022-02-19 DIAGNOSIS — M25562 Pain in left knee: Secondary | ICD-10-CM

## 2022-02-19 DIAGNOSIS — R600 Localized edema: Secondary | ICD-10-CM | POA: Diagnosis not present

## 2022-02-19 DIAGNOSIS — R262 Difficulty in walking, not elsewhere classified: Secondary | ICD-10-CM

## 2022-02-19 DIAGNOSIS — M25662 Stiffness of left knee, not elsewhere classified: Secondary | ICD-10-CM | POA: Diagnosis not present

## 2022-02-19 NOTE — Therapy (Signed)
OUTPATIENT PHYSICAL THERAPY TREATMENT NOTE Progress Note reporting period date 01/22/22 to 02/19/22  See below for objective and subjective measurements relating to patients progress with PT.    Patient Name: Jenna Thompson MRN: [id number] DOB:Jul 10, 1945, 76 y.o., female Today's Date: 02/19/2022  END OF SESSION:   PT End of Session - 02/19/22 1204     Visit Number 7    Number of Visits 24    Date for PT Re-Evaluation 04/19/22    Authorization Type UHC/Medicare    Progress Note Due on Visit 34    PT Start Time 1159   arrives las[place]   PT Stop Time 8309   arrives late   PT Time Calculation (min) 31 min    Equipment Utilized During Treatment Gait belt    Activity Tolerance Patient tolerated treatment well    Behavior During Therapy WFL for tasks assessed/performed             Past Medical History:  Diagnosis Date   Anemia, iron deficiency    Arthritis    L>R knee   Atrophic gastritis without mention of hemorrhage    CAD (coronary artery disease)    a. s/p Inferior STEMI in 04/2017 with angioplasty and thrombectomy to mid-RCA, repeat cath later that admission with aspiration thrombectomy of rPDA and residual thrombus along distal vessel too small for intervention   Colon polyps    Diaphragmatic hernia without mention of obstruction or gangrene    Diverticulosis of colon (without mention of hemorrhage)    Hiatal hernia    Hypertension    Unspecified hemorrhoids without mention of complication    Past Surgical History:  Procedure Laterality Date   ABDOMINAL HYSTERECTOMY  1978   CHOLECYSTECTOMY  1979   CORONARY ANGIOGRAPHY N/A 05/02/2017   Procedure: CORONARY ANGIOGRAPHY (CATH LAB);  Surgeon: Nelva Bush, MD;  Location: [place] [facility];  Service: Cardiovascular;  Laterality: N/A;   CORONARY BALLOON ANGIOPLASTY N/A 05/02/2017   Procedure: CORONARY BALLOON ANGIOPLASTY;  Surgeon: Nelva Bush, MD;  Location: [place] [facility];  Service: Cardiovascular;  Laterality:  N/A;   CORONARY THROMBECTOMY N/A 05/02/2017   Procedure: Coronary Thrombectomy;  Surgeon: Nelva Bush, MD;  Location: [place] [facility];  Service: Cardiovascular;  Laterality: N/A;   CORONARY/GRAFT ACUTE MI REVASCULARIZATION N/A 05/01/2017   Procedure: Coronary/Graft Acute MI Revascularization;  Surgeon: Burnell Blanks, MD;  Location: [place] [facility];  Service: Cardiovascular;  Laterality: N/A;   KNEE SURGERY  2001   left    LEFT HEART CATH AND CORONARY ANGIOGRAPHY N/A 05/01/2017   Procedure: LEFT HEART CATH AND CORONARY ANGIOGRAPHY;  Surgeon: Burnell Blanks, MD;  Location: [place] [facility];  Service: Cardiovascular;  Laterality: N/A;   TONSILLECTOMY AND ADENOIDECTOMY  1970   TOTAL KNEE ARTHROPLASTY Left 01/08/2022   Procedure: LEFT TOTAL KNEE ARTHROPLASTY;  Surgeon: Leandrew Koyanagi, MD;  Location: [place];  Service: Orthopedics;  Laterality: Left;   Patient Active Problem List   Diagnosis Date Noted   Primary localized osteoarthritis of left knee 01/09/2022   Status post total left knee replacement 01/08/2022   Primary osteoarthritis of right knee 01/14/2020   Primary osteoarthritis of right hip 01/14/2020   Primary osteoarthritis of left knee 01/14/2020   Hyperlipidemia 05/05/2017   S/P PTCA (percutaneous transluminal coronary angioplasty)    Iron deficiency anemia    Acute myocardial infarction [facility])    Primary osteoarthritis of both knees 12/20/2016   Personal history of colonic polyps 12/26/2010  THERAPY DIAG:  Acute pain of left knee  Stiffness of left knee, not elsewhere classified  Difficulty in walking, not elsewhere classified  Localized edema  PCP: Donald Prose, MD   REFERRING PROVIDER: Leandrew Koyanagi, MD   REFERRING DIAG: [phone] (ICD-10-CM) - Primary localized osteoarthritis of left knee   Rationale for Evaluation and Treatment Rehabilitation   ONSET DATE: 01/08/22 left TKA SUBJECTIVE:    SUBJECTIVE STATEMENT: Pt arriving stating more  pain overall this weekend and had to take more pain meds. Today it feels better.   PERTINENT HISTORY: 01/08/22 left TKA anemia, arthritis, CAD, diverticulosis, HTN, Coronary/graft acute MI revascularization 2019, abdominla hysterectomy,    PAIN:  NPRS scale: 2/10 Pain location: left knee Pain description: achy, burning, throbbing Aggravating factors: bending, sitting too long Relieving factors: ice, pain meds   PRECAUTIONS: Fall   WEIGHT BEARING RESTRICTIONS No   FALLS:  Has patient fallen in last 6 months? No   LIVING ENVIRONMENT: Lives with: lives with their family Lives in: House/apartment Stairs: Yes: Internal: 1 flight steps; on left going up Has following equipment at home: Walker - 2 wheeled, st cane , raised toilet   OCCUPATION: retired   PLOF: Independent   PATIENT GOALS be able to walk without pain     OBJECTIVE:    DIAGNOSTIC FINDINGS: post op x-ray   PATIENT SURVEYS:  01/22/22: FOTO intake: 47%  predicted:  68%    COGNITION:           Overall cognitive status: WFL                    SENSATION: WFL   EDEMA:  01/22/22:  Circumferential: left: 44.5 centimeters                            Right 39 centimeters       POSTURE: rounded shoulders and forward head   PALPATION: Tenderness noted around entire left knee joint   LOWER EXTREMITY ROM:   A = Active ROM P=passive ROM Right 01/22/22 Left 01/22/22 Left 02/05/22 Left 02/19/22  Hip flexion        Hip extension        Hip abduction        Hip adduction        Hip internal rotation        Hip external rotation        Knee flexion A: 130 A: 78 P: 82 A:95 P:100 A:100 P:110  Knee extension A: 0 A: -8 P: -6 A:-5 P: A:0 P:0  Ankle dorsiflexion        Ankle plantarflexion        Ankle inversion        Ankle eversion         (Blank rows = not tested)   LOWER EXTREMITY MMT:   MMT Right 01/22/22 Left 01/22/22 Left 02/12/22 02/19/22  Hip flexion 5/5 3/5  4/5  Hip extension         Hip abduction 5/5 4/5    Hip adduction 5/5 4/5    Hip internal rotation        Hip external rotation        Knee flexion 5/5 3/5 4/5 4+  Knee extension 5/5 3/5 4/5 4+  Ankle dorsiflexion        Ankle plantarflexion        Ankle inversion        Ankle eversion         (  Blank rows = not tested)     FUNCTIONAL TESTS:  01/22/22: 5 times sit to stand: 27 seconds c UE support 02/12/22: 5 times STS 16.6 seconds with UE support 02/19/22 Sit to stand 5 times with UE support from standard chair 15.05 seconds   GAIT: Distance walked: 30 feet  Assistive device utilized: Environmental consultant - 2 wheeled Level of assistance: Modified independence Comments: antalgic step through gait pattern     TODAY'S TREATMENT: 02/19/22 -Recumbent bike L3 X 8  min seat #7 -Seated knee flexion AAROM 5 sec hold X 15 -Seated leg extension machine 10# DL 3X10 -Seated hamstring curl machine 25# 2X15 -Step ups onto 6 inch step X 10 fwd and X 10 lateral leading with Lt, one UE support -Sit to stand 5 times with UE support from standard chair 15.05 seconds -Leg press DL 68# 2X10, Leg press SL left only 37# 2X 10     PATIENT EDUCATION:  Education details: HEP, POC Person educated: Patient Education method: Consulting civil engineer, Demonstration, Verbal cues, and Handouts Education comprehension: verbalized understanding, returned demonstration, and verbal cues required       HOME EXERCISE PROGRAM: Access Code: [email or web address] URL: [email or web address] Date: 01/22/2022 Prepared by: Kearney Hard   Exercises - Supine Quad Set  - 4 x daily - 7 x weekly - 2 sets - 10 reps - 5 seconds hold - Supine Heel Slide with Strap  - 4 x daily - 7 x weekly - 2 sets - 10 reps - 5 seconds hold - Seated Long Arc Quad  - 4 x daily - 7 x weekly - 2 sets - 10 reps - 5 seconds hold - Seated Heel Slide  - 4 x daily - 7 x weekly - 2 sets - 10 reps - Sit to Stand with Counter Support  - 4 x daily - 7 x weekly - 10 reps - Seated Small  Alternating Straight Leg Lifts with Heel Touch  - 4 x daily - 7 x weekly - 10 reps   ASSESSMENT:   CLINICAL IMPRESSION: Overall doing well with PT post op Lt TKA on 01/08/22. She has made good progress in her overall knee ROM, strength, and gait. See above for objective data. PT recommending a few more weeks of PT to maximize strength and function. Then we we will work to transition her to independent program after.         OBJECTIVE IMPAIRMENTS Abnormal gait, decreased activity tolerance, decreased balance, decreased mobility, difficulty walking, decreased ROM, decreased strength, increased edema, impaired flexibility, and pain.    ACTIVITY LIMITATIONS sitting, standing, squatting, sleeping, stairs, transfers, and bed mobility   PARTICIPATION LIMITATIONS: meal prep, cleaning, laundry, and community activity   PERSONAL FACTORS anemia, arthritis, CAD, diverticulosis, HTN, Coronary/graft acute MI revascularization 2019, abdominla hysterectomy,  are also affecting patient's functional outcome.    REHAB POTENTIAL: Good   CLINICAL DECISION MAKING: Stable/uncomplicated   EVALUATION COMPLEXITY: Low     GOALS: Goals reviewed with patient? Yes   Short term PT Goals (target date for Short term goals are 3 weeks 02/15/22) Patient will demonstrate independent use of home exercise program to maintain progress from in clinic treatments. Goal status: MET 02/12/22   Pt will improve her 5 time to sit to stand to </= 15 seconds with/without UE support Goal Status: ongoing, almost met, now 15:05 on 02/19/22   Long term PT goals (target dates for all long term goals are 12 weeks  04/19/22 )   1. Patient will demonstrate/report pain  at worst less than or equal to 2/10 to facilitate minimal limitation in daily activity secondary to pain symptoms. Goal status: ongoing   2. Patient will demonstrate independent use of home exercise program to facilitate ability to maintain/progress functional gains from  skilled physical therapy services. Goal status: ongoing   3. Patient will demonstrate FOTO outcome > or = 68 % to indicate reduced disability due to condition. Goal status: ongoing   4.  Patient will demonstrate left  LE MMT 5/5 throughout to faciltiate usual transfers, stairs, squatting at [facility] for daily life.    Goal status: ongoing   5.  pt will be able to navigate 1 flight of stairs with single hand rail with step over step pattern.    Goal status: ongoing   6.  Pt will improve left knee active ROM arc 2-115 degrees.    Goal status: ongoing   7. Pt will amb 500 feet with no device on level surfaces with normalized gait pattern.             A. Goal Status: ongoing           PLAN:   PT FREQUENCY: 2-3 x/week   PT DURATION: 12 weeks   PLANNED INTERVENTIONS: Therapeutic exercises, Therapeutic activity, Neuro Muscular re-education, Balance training, Gait training, Patient/Family education, Joint mobilization, Stair training, DME instructions, Dry Needling, Electrical stimulation, Traction, Cryotherapy, Moist heat, Taping, Ultrasound, Ionotophoresis 52m/ml Dexamethasone, and Manual therapy.  All included unless contraindicated     PLAN FOR NEXT SESSION: knee ROM, quad strengthening, gait with [place] PT,DPT 02/19/2022, 12:14 PM

## 2022-02-21 ENCOUNTER — Ambulatory Visit: Payer: Medicare Other | Admitting: Physical Therapy

## 2022-02-21 ENCOUNTER — Ambulatory Visit (INDEPENDENT_AMBULATORY_CARE_PROVIDER_SITE_OTHER): Payer: Medicare Other

## 2022-02-21 ENCOUNTER — Ambulatory Visit (INDEPENDENT_AMBULATORY_CARE_PROVIDER_SITE_OTHER): Payer: Medicare Other | Admitting: Physician Assistant

## 2022-02-21 ENCOUNTER — Encounter: Payer: Self-pay | Admitting: Orthopaedic Surgery

## 2022-02-21 ENCOUNTER — Encounter: Payer: Self-pay | Admitting: Physical Therapy

## 2022-02-21 DIAGNOSIS — R600 Localized edema: Secondary | ICD-10-CM | POA: Diagnosis not present

## 2022-02-21 DIAGNOSIS — M25662 Stiffness of left knee, not elsewhere classified: Secondary | ICD-10-CM

## 2022-02-21 DIAGNOSIS — R262 Difficulty in walking, not elsewhere classified: Secondary | ICD-10-CM | POA: Diagnosis not present

## 2022-02-21 DIAGNOSIS — M25562 Pain in left knee: Secondary | ICD-10-CM

## 2022-02-21 DIAGNOSIS — Z96652 Presence of left artificial knee joint: Secondary | ICD-10-CM

## 2022-02-21 NOTE — Therapy (Signed)
OUTPATIENT PHYSICAL THERAPY TREATMENT NOTE   Patient Name: Jenna Thompson MRN: [id number] DOB:1945/07/06, 76 y.o., female Today's Date: 02/21/2022  END OF SESSION:   PT End of Session - 02/21/22 1153     Visit Number 8    Number of Visits 24    Date for PT Re-Evaluation 04/19/22    Authorization Type UHC/Medicare    Progress Note Due on Visit 91    PT Start Time 1140    PT Stop Time 1220    PT Time Calculation (min) 40 min    Equipment Utilized During Treatment Gait belt    Activity Tolerance Patient tolerated treatment well    Behavior During Therapy WFL for tasks assessed/performed             Past Medical History:  Diagnosis Date   Anemia, iron deficiency    Arthritis    L>R knee   Atrophic gastritis without mention of hemorrhage    CAD (coronary artery disease)    a. s/p Inferior STEMI in 04/2017 with angioplasty and thrombectomy to mid-RCA, repeat cath later that admission with aspiration thrombectomy of rPDA and residual thrombus along distal vessel too small for intervention   Colon polyps    Diaphragmatic hernia without mention of obstruction or gangrene    Diverticulosis of colon (without mention of hemorrhage)    Hiatal hernia    Hypertension    Unspecified hemorrhoids without mention of complication    Past Surgical History:  Procedure Laterality Date   [place] N/A 05/02/2017   Procedure: CORONARY ANGIOGRAPHY (CATH LAB);  Surgeon: Nelva Bush, MD;  Location: [place] [facility];  Service: Cardiovascular;  Laterality: N/A;   CORONARY BALLOON ANGIOPLASTY N/A 05/02/2017   Procedure: CORONARY BALLOON ANGIOPLASTY;  Surgeon: Nelva Bush, MD;  Location: [place] [facility];  Service: Cardiovascular;  Laterality: N/A;   CORONARY THROMBECTOMY N/A 05/02/2017   Procedure: Coronary Thrombectomy;  Surgeon: Nelva Bush, MD;  Location: [place] [facility];  Service: Cardiovascular;  Laterality:  N/A;   CORONARY/GRAFT ACUTE MI REVASCULARIZATION N/A 05/01/2017   Procedure: Coronary/Graft Acute MI Revascularization;  Surgeon: Burnell Blanks, MD;  Location: [place] [facility];  Service: Cardiovascular;  Laterality: N/A;   KNEE SURGERY  2001   left    LEFT HEART CATH AND CORONARY ANGIOGRAPHY N/A 05/01/2017   Procedure: LEFT HEART CATH AND CORONARY ANGIOGRAPHY;  Surgeon: Burnell Blanks, MD;  Location: [place] [facility];  Service: Cardiovascular;  Laterality: N/A;   TONSILLECTOMY AND ADENOIDECTOMY  1970   TOTAL KNEE ARTHROPLASTY Left 01/08/2022   Procedure: LEFT TOTAL KNEE ARTHROPLASTY;  Surgeon: Leandrew Koyanagi, MD;  Location: [place] [facility];  Service: Orthopedics;  Laterality: Left;   Patient Active Problem List   Diagnosis Date Noted   Primary localized osteoarthritis of left knee 01/09/2022   Status post total left knee replacement 01/08/2022   Primary osteoarthritis of right knee 01/14/2020   Primary osteoarthritis of right hip 01/14/2020   Primary osteoarthritis of left knee 01/14/2020   Hyperlipidemia 05/05/2017   S/P PTCA (percutaneous transluminal coronary angioplasty)    Iron deficiency anemia    Acute myocardial infarction [facility])    Primary osteoarthritis of both knees 12/20/2016   Personal history of colonic polyps 12/26/2010     THERAPY DIAG:  Acute pain of left knee  Stiffness of left knee, not elsewhere classified  Difficulty in walking, not elsewhere classified  Localized edema  PCP: Donald Prose, MD   REFERRING PROVIDER: Leandrew Koyanagi, MD   REFERRING DIAG: [phone] (ICD-10-CM) - Primary localized osteoarthritis of left knee   Rationale for Evaluation and Treatment Rehabilitation   ONSET DATE: 01/08/22 left TKA SUBJECTIVE:    SUBJECTIVE STATEMENT: Pt arriving stating not much pain today, states she had a good follow up with PA who thinks she is doing well.    PERTINENT HISTORY: 01/08/22 left TKA anemia, arthritis, CAD, diverticulosis, HTN,  Coronary/graft acute MI revascularization 2019, abdominla hysterectomy,    PAIN:  NPRS scale: 2/10 Pain location: left knee Pain description: achy, burning, throbbing Aggravating factors: bending, sitting too long Relieving factors: ice, pain meds   PRECAUTIONS: Fall   WEIGHT BEARING RESTRICTIONS No   FALLS:  Has patient fallen in last 6 months? No   LIVING ENVIRONMENT: Lives with: lives with their family Lives in: House/apartment Stairs: Yes: Internal: 1 flight steps; on left going up Has following equipment at home: Walker - 2 wheeled, st cane , raised toilet   OCCUPATION: retired   PLOF: Independent   PATIENT GOALS be able to walk without pain     OBJECTIVE:    DIAGNOSTIC FINDINGS: post op x-ray   PATIENT SURVEYS:  01/22/22: FOTO intake: 47%  predicted:  68%    COGNITION:           Overall cognitive status: WFL                    SENSATION: WFL   EDEMA:  01/22/22:  Circumferential: left: 44.5 centimeters                            Right 39 centimeters       POSTURE: rounded shoulders and forward head   PALPATION: Tenderness noted around entire left knee joint   LOWER EXTREMITY ROM:   A = Active ROM P=passive ROM Right 01/22/22 Left 01/22/22 Left 02/05/22 Left 02/19/22  Hip flexion        Hip extension        Hip abduction        Hip adduction        Hip internal rotation        Hip external rotation        Knee flexion A: 130 A: 78 P: 82 A:95 P:100 A:100 P:110  Knee extension A: 0 A: -8 P: -6 A:-5 P: A:0 P:0  Ankle dorsiflexion        Ankle plantarflexion        Ankle inversion        Ankle eversion         (Blank rows = not tested)   LOWER EXTREMITY MMT:   MMT Right 01/22/22 Left 01/22/22 Left 02/12/22 02/19/22  Hip flexion 5/5 3/5  4/5  Hip extension        Hip abduction 5/5 4/5    Hip adduction 5/5 4/5    Hip internal rotation        Hip external rotation        Knee flexion 5/5 3/5 4/5 4+  Knee extension 5/5 3/5 4/5  4+  Ankle dorsiflexion        Ankle plantarflexion        Ankle inversion        Ankle eversion         (Blank rows = not tested)     FUNCTIONAL TESTS:  01/22/22: 5 times  sit to stand: 27 seconds c UE support 02/12/22: 5 times STS 16.6 seconds with UE support 02/19/22 Sit to stand 5 times with UE support from standard chair 15.05 seconds   GAIT: Distance walked: 30 feet  Assistive device utilized: Environmental consultant - 2 wheeled Level of assistance: Modified independence Comments: antalgic step through gait pattern     TODAY'S TREATMENT: 02/21/22 -Nu step L6 X 9 min UE/LE -Seated knee flexion AAROM 5 sec hold X 15 -Seated leg extension machine 15# DL 3X10 -Seated hamstring curl machine 25# 2X15 -Step ups onto 6 inch step X 10 fwd and X 10 lateral leading with Lt, one UE support -Sit to stand 10 times with UE support from standard chair 10X -Seated SLR on left 2X10 -Leg press DL 68# 2X12, Leg press SL left only 37# 2X 10  02/19/22 -Recumbent bike L3 X 8  min seat #7 -Seated knee flexion AAROM 5 sec hold X 15 -Seated leg extension machine 10# DL 3X10 -Seated hamstring curl machine 25# 2X15 -Step ups onto 6 inch step X 10 fwd and X 10 lateral leading with Lt, one UE support -Sit to stand 5 times with UE support from standard chair 15.05 seconds -Leg press DL 68# 2X10, Leg press SL left only 37# 2X 10     PATIENT EDUCATION:  Education details: HEP, POC Person educated: Patient Education method: Consulting civil engineer, Demonstration, Verbal cues, and Handouts Education comprehension: verbalized understanding, returned demonstration, and verbal cues required       HOME EXERCISE PROGRAM: Access Code: [email or web address] URL: [email or web address] Date: 01/22/2022 Prepared by: Kearney Hard   Exercises - Supine Quad Set  - 4 x daily - 7 x weekly - 2 sets - 10 reps - 5 seconds hold - Supine Heel Slide with Strap  - 4 x daily - 7 x weekly - 2 sets - 10 reps - 5 seconds hold - Seated Long  Arc Quad  - 4 x daily - 7 x weekly - 2 sets - 10 reps - 5 seconds hold - Seated Heel Slide  - 4 x daily - 7 x weekly - 2 sets - 10 reps - Sit to Stand with Counter Support  - 4 x daily - 7 x weekly - 10 reps - Seated Small Alternating Straight Leg Lifts with Heel Touch  - 4 x daily - 7 x weekly - 10 reps   ASSESSMENT:   CLINICAL IMPRESSION: She appeared to have good follow up appointment from [place]. She had good tolerance to session noted today.  PT recommending to continue to work to maximize her functional strength.        OBJECTIVE IMPAIRMENTS Abnormal gait, decreased activity tolerance, decreased balance, decreased mobility, difficulty walking, decreased ROM, decreased strength, increased edema, impaired flexibility, and pain.    ACTIVITY LIMITATIONS sitting, standing, squatting, sleeping, stairs, transfers, and bed mobility   PARTICIPATION LIMITATIONS: meal prep, cleaning, laundry, and community activity   PERSONAL FACTORS anemia, arthritis, CAD, diverticulosis, HTN, Coronary/graft acute MI revascularization 2019, abdominla hysterectomy,  are also affecting patient's functional outcome.    REHAB POTENTIAL: Good   CLINICAL DECISION MAKING: Stable/uncomplicated   EVALUATION COMPLEXITY: Low     GOALS: Goals reviewed with patient? Yes   Short term PT Goals (target date for Short term goals are 3 weeks 02/15/22) Patient will demonstrate independent use of home exercise program to maintain progress from in clinic treatments. Goal status: MET 02/12/22   Pt will improve her 5 time to sit to stand to </=  15 seconds with/without UE support Goal Status: ongoing, almost met, now 15:05 on 02/19/22   Long term PT goals (target dates for all long term goals are 12 weeks  04/19/22 )   1. Patient will demonstrate/report pain at worst less than or equal to 2/10 to facilitate minimal limitation in daily activity secondary to pain symptoms. Goal status: ongoing   2. Patient will demonstrate  independent use of home exercise program to facilitate ability to maintain/progress functional gains from skilled physical therapy services. Goal status: ongoing   3. Patient will demonstrate FOTO outcome > or = 68 % to indicate reduced disability due to condition. Goal status: ongoing   4.  Patient will demonstrate left  LE MMT 5/5 throughout to faciltiate usual transfers, stairs, squatting at [facility] for daily life.    Goal status: ongoing   5.  pt will be able to navigate 1 flight of stairs with single hand rail with step over step pattern.    Goal status: ongoing   6.  Pt will improve left knee active ROM arc 2-115 degrees.    Goal status: ongoing   7. Pt will amb 500 feet with no device on level surfaces with normalized gait pattern.             A. Goal Status: ongoing           PLAN:   PT FREQUENCY: 2-3 x/week   PT DURATION: 12 weeks   PLANNED INTERVENTIONS: Therapeutic exercises, Therapeutic activity, Neuro Muscular re-education, Balance training, Gait training, Patient/Family education, Joint mobilization, Stair training, DME instructions, Dry Needling, Electrical stimulation, Traction, Cryotherapy, Moist heat, Taping, Ultrasound, Ionotophoresis 17m/ml Dexamethasone, and Manual therapy.  All included unless contraindicated     PLAN FOR NEXT SESSION: knee ROM, quad strengthening, gait with LRAD  BDebbe Odea PT,DPT 02/21/2022, 12:18 PM

## 2022-02-21 NOTE — Progress Notes (Signed)
Post-Op Visit Note   Patient: Jenna Thompson           Date of Birth: 07/24/1945           MRN: [id number] Visit Date: 02/21/2022 PCP: Deatra James, MD   Assessment & Plan:  Chief Complaint:  Chief Complaint  Patient presents with   Left Knee - Follow-up    Left total knee arthroplasty 01/08/2022   Visit Diagnoses:  1. Status post total left knee replacement     Plan: Patient is a pleasant 76 year old female who comes in today 6 weeks status post left total knee replacement 01/08/2022.  She has been doing well.  She is taking an occasional Norco for pain.  She is in physical therapy making good progress.  Examination of her left knee reveals a fully healed surgical scar without complication.  Range of motion 0 to 115 degrees.  She is stable to valgus varus stress.  She is neurovascular intact distally.  At this point, she will continue with physical therapy and wean to home exercise program when ready.  Dental prophylaxis reinforced.  Follow-up in 6 weeks for recheck.  Call with concerns or questions.  Follow-Up Instructions: Return in about 6 weeks (around 04/04/2022).   Orders:  Orders Placed This Encounter  Procedures   XR Knee 1-2 Views Left   No orders of the defined types were placed in this encounter.   Imaging: No results found.  PMFS History: Patient Active Problem List   Diagnosis Date Noted   Primary localized osteoarthritis of left knee 01/09/2022   Status post total left knee replacement 01/08/2022   Primary osteoarthritis of right knee 01/14/2020   Primary osteoarthritis of right hip 01/14/2020   Primary osteoarthritis of left knee 01/14/2020   Hyperlipidemia 05/05/2017   S/P PTCA (percutaneous transluminal coronary angioplasty)    Iron deficiency anemia    Acute myocardial infarction [facility])    Primary osteoarthritis of both knees 12/20/2016   Personal history of colonic polyps 12/26/2010   Past Medical History:  Diagnosis Date   Anemia, iron deficiency     Arthritis    L>R knee   Atrophic gastritis without mention of hemorrhage    CAD (coronary artery disease)    a. s/p Inferior STEMI in 04/2017 with angioplasty and thrombectomy to mid-RCA, repeat cath later that admission with aspiration thrombectomy of rPDA and residual thrombus along distal vessel too small for intervention   Colon polyps    Diaphragmatic hernia without mention of obstruction or gangrene    Diverticulosis of colon (without mention of hemorrhage)    Hiatal hernia    Hypertension    Unspecified hemorrhoids without mention of complication     Family History  Problem Relation Age of Onset   Kidney disease Mother    Colon cancer Neg Hx    Breast cancer Neg Hx     Past Surgical History:  Procedure Laterality Date   ABDOMINAL HYSTERECTOMY  1978   CHOLECYSTECTOMY  1979   CORONARY ANGIOGRAPHY N/A 05/02/2017   Procedure: CORONARY ANGIOGRAPHY (CATH LAB);  Surgeon: Yvonne Kendall, MD;  [facility]: [facility];  Service: Cardiovascular;  Laterality: N/A;   CORONARY BALLOON ANGIOPLASTY N/A 05/02/2017   Procedure: CORONARY BALLOON ANGIOPLASTY;  Surgeon: Yvonne Kendall, MD;  Location: [facility];  Service: Cardiovascular;  Laterality: N/A;   CORONARY THROMBECTOMY N/A 05/02/2017   Procedure: Coronary Thrombectomy;  Surgeon: Yvonne Kendall, MD;  Location: [facility];  Service: Cardiovascular;  Laterality: N/A;   CORONARY/GRAFT ACUTE MI REVASCULARIZATION N/A 05/01/2017   Procedure: Coronary/Graft Acute MI Revascularization;  Surgeon: Kathleene Hazel, MD;  Location: [facility];  Service: Cardiovascular;  Laterality: N/A;   KNEE SURGERY  2001   left    LEFT HEART CATH AND CORONARY ANGIOGRAPHY N/A 05/01/2017   Procedure: LEFT HEART CATH AND CORONARY ANGIOGRAPHY;  Surgeon: Kathleene Hazel, MD;  Location: [facility];  Service: Cardiovascular;  Laterality: N/A;   TONSILLECTOMY AND ADENOIDECTOMY  1970   TOTAL KNEE ARTHROPLASTY Left  01/08/2022   Procedure: LEFT TOTAL KNEE ARTHROPLASTY;  Surgeon: Tarry Kos, MD;  Location: [facility];  Service: Orthopedics;  Laterality: Left;   Social History   Occupational History   Occupation: Retired  Tobacco Use   Smoking status: Former   Smokeless tobacco: Never  Building services engineer Use: Never used  Substance and Sexual Activity   Alcohol use: No   Drug use: No   Sexual activity: Not on file

## 2022-02-26 ENCOUNTER — Ambulatory Visit: Payer: Medicare Other | Admitting: Physical Therapy

## 2022-02-26 ENCOUNTER — Encounter: Payer: Self-pay | Admitting: Physical Therapy

## 2022-02-26 DIAGNOSIS — M25662 Stiffness of left knee, not elsewhere classified: Secondary | ICD-10-CM | POA: Diagnosis not present

## 2022-02-26 DIAGNOSIS — R600 Localized edema: Secondary | ICD-10-CM

## 2022-02-26 DIAGNOSIS — R262 Difficulty in walking, not elsewhere classified: Secondary | ICD-10-CM | POA: Diagnosis not present

## 2022-02-26 DIAGNOSIS — M25562 Pain in left knee: Secondary | ICD-10-CM

## 2022-02-26 NOTE — Therapy (Signed)
OUTPATIENT PHYSICAL THERAPY TREATMENT NOTE   Patient Name: Jenna Thompson MRN: [id number] DOB:09-28-1945, 76 y.o., female Today's Date: 02/26/2022  END OF SESSION:   PT End of Session - 02/26/22 1108     Visit Number 9    Number of Visits 24    Date for PT Re-Evaluation 04/19/22    Authorization Type UHC/Medicare    Progress Note Due on Visit 30    PT Start Time 1059    PT Stop Time 1137    PT Time Calculation (min) 38 min    Equipment Utilized During Treatment Gait belt    Activity Tolerance Patient tolerated treatment well    Behavior During Therapy WFL for tasks assessed/performed             Past Medical History:  Diagnosis Date   Anemia, iron deficiency    Arthritis    L>R knee   Atrophic gastritis without mention of hemorrhage    CAD (coronary artery disease)    a. s/p Inferior STEMI in 04/2017 with angioplasty and thrombectomy to mid-RCA, repeat cath later that admission with aspiration thrombectomy of rPDA and residual thrombus along distal vessel too small for intervention   Colon polyps    Diaphragmatic hernia without mention of obstruction or gangrene    Diverticulosis of colon (without mention of hemorrhage)    Hiatal hernia    Hypertension    Unspecified hemorrhoids without mention of complication    Past Surgical History:  Procedure Laterality Date   ABDOMINAL HYSTERECTOMY  [place]   CORONARY ANGIOGRAPHY N/A 05/02/2017   Procedure: CORONARY ANGIOGRAPHY (CATH LAB);  Surgeon: Nelva Bush, MD;  Location: [place] [facility];  Service: Cardiovascular;  Laterality: N/A;   CORONARY BALLOON ANGIOPLASTY N/A 05/02/2017   Procedure: CORONARY BALLOON ANGIOPLASTY;  Surgeon: Nelva Bush, MD;  Location: [place] [facility];  Service: Cardiovascular;  Laterality: N/A;   CORONARY THROMBECTOMY N/A 05/02/2017   Procedure: Coronary Thrombectomy;  Surgeon: Nelva Bush, MD;  Location: [place] [facility];  Service: Cardiovascular;   Laterality: N/A;   CORONARY/GRAFT ACUTE MI REVASCULARIZATION N/A 05/01/2017   Procedure: Coronary/Graft Acute MI Revascularization;  Surgeon: Burnell Blanks, MD;  Location: [place] [facility];  Service: Cardiovascular;  Laterality: N/A;   KNEE SURGERY  2001   left    LEFT HEART CATH AND CORONARY ANGIOGRAPHY N/A 05/01/2017   Procedure: LEFT HEART CATH AND CORONARY ANGIOGRAPHY;  Surgeon: Burnell Blanks, MD;  Location: [place] [facility];  Service: Cardiovascular;  Laterality: N/A;   TONSILLECTOMY AND ADENOIDECTOMY  1970   TOTAL KNEE ARTHROPLASTY Left 01/08/2022   Procedure: LEFT TOTAL KNEE ARTHROPLASTY;  Surgeon: Leandrew Koyanagi, MD;  Location: [place];  Service: Orthopedics;  Laterality: Left;   Patient Active Problem List   Diagnosis Date Noted   Primary localized osteoarthritis of left knee 01/09/2022   Status post total left knee replacement 01/08/2022   Primary osteoarthritis of right knee 01/14/2020   Primary osteoarthritis of right hip 01/14/2020   Primary osteoarthritis of left knee 01/14/2020   Hyperlipidemia 05/05/2017   S/P PTCA (percutaneous transluminal coronary angioplasty)    Iron deficiency anemia    Acute myocardial infarction [facility])    Primary osteoarthritis of both knees 12/20/2016   Personal history of colonic polyps 12/26/2010     THERAPY DIAG:  Acute pain of left knee  Stiffness of left knee, not elsewhere classified  Difficulty in walking, not elsewhere classified  Localized edema  PCP: Donald Prose, MD   REFERRING PROVIDER: Leandrew Koyanagi, MD   REFERRING DIAG: [phone] (ICD-10-CM) - Primary localized osteoarthritis of left knee   Rationale for Evaluation and Treatment Rehabilitation   ONSET DATE: 01/08/22 left TKA SUBJECTIVE:    SUBJECTIVE STATEMENT: Pt arriving stating she gets some pulling in her left knee with stairs. Other than that not much pain todays    PERTINENT HISTORY: 01/08/22 left TKA anemia, arthritis, CAD, diverticulosis,  HTN, Coronary/graft acute MI revascularization 2019, abdominla hysterectomy,    PAIN:  NPRS scale: 2/10 Pain location: left knee Pain description: achy, burning, throbbing Aggravating factors: bending, sitting too long Relieving factors: ice, pain meds   PRECAUTIONS: Fall   WEIGHT BEARING RESTRICTIONS No   FALLS:  Has patient fallen in last 6 months? No   LIVING ENVIRONMENT: Lives with: lives with their family Lives in: House/apartment Stairs: Yes: Internal: 1 flight steps; on left going up Has following equipment at home: Walker - 2 wheeled, st cane , raised toilet   OCCUPATION: retired   PLOF: Independent   PATIENT GOALS be able to walk without pain     OBJECTIVE:    DIAGNOSTIC FINDINGS: post op x-ray   PATIENT SURVEYS:  01/22/22: FOTO intake: 47%  predicted:  68%    COGNITION:           Overall cognitive status: WFL                    SENSATION: WFL   EDEMA:  01/22/22:  Circumferential: left: 44.5 centimeters                            Right 39 centimeters       POSTURE: rounded shoulders and forward head   PALPATION: Tenderness noted around entire left knee joint   LOWER EXTREMITY ROM:   A = Active ROM P=passive ROM Right 01/22/22 Left 01/22/22 Left 02/05/22 Left 02/19/22  Hip flexion        Hip extension        Hip abduction        Hip adduction        Hip internal rotation        Hip external rotation        Knee flexion A: 130 A: 78 P: 82 A:95 P:100 A:100 P:110  Knee extension A: 0 A: -8 P: -6 A:-5 P: A:0 P:0  Ankle dorsiflexion        Ankle plantarflexion        Ankle inversion        Ankle eversion         (Blank rows = not tested)   LOWER EXTREMITY MMT:   MMT Right 01/22/22 Left 01/22/22 Left 02/12/22 02/19/22  Hip flexion 5/5 3/5  4/5  Hip extension        Hip abduction 5/5 4/5    Hip adduction 5/5 4/5    Hip internal rotation        Hip external rotation        Knee flexion 5/5 3/5 4/5 4+  Knee extension 5/5 3/5  4/5 4+  Ankle dorsiflexion        Ankle plantarflexion        Ankle inversion        Ankle eversion         (Blank rows = not tested)     FUNCTIONAL TESTS:  01/22/22: 5 times sit to  stand: 27 seconds c UE support 02/12/22: 5 times STS 16.6 seconds with UE support 02/19/22 Sit to stand 5 times with UE support from standard chair 15.05 seconds 02/26/22: 5 times sit to stand with UE support 10.5 seconds   GAIT: Distance walked: 30 feet  Assistive device utilized: Environmental consultant - 2 wheeled Level of assistance: Modified independence Comments: antalgic step through gait pattern     TODAY'S TREATMENT: 02/26/22 -Nu step L6 X 9 min UE/LE -Seated knee flexion AAROM 5 sec hold X 15 -Seated leg extension machine 15# DL 3X10 -Seated hamstring curl machine 25# 2X15 -Step ups onto 6 inch step X 15 fwd and X 10 lateral leading with Lt, one UE support -5 times sit to stand with UE support 10.5 seconds -Seated SLR on left 2X12 -Leg press DL 75# 2X12, Leg press SL left only 37# 2X 12     PATIENT EDUCATION:  Education details: HEP, POC Person educated: Patient Education method: Consulting civil engineer, Demonstration, Verbal cues, and Handouts Education comprehension: verbalized understanding, returned demonstration, and verbal cues required       HOME EXERCISE PROGRAM: Access Code: [email or web address] URL: [email or web address] Date: 01/22/2022 Prepared by: Kearney Hard   Exercises - Supine Quad Set  - 4 x daily - 7 x weekly - 2 sets - 10 reps - 5 seconds hold - Supine Heel Slide with Strap  - 4 x daily - 7 x weekly - 2 sets - 10 reps - 5 seconds hold - Seated Long Arc Quad  - 4 x daily - 7 x weekly - 2 sets - 10 reps - 5 seconds hold - Seated Heel Slide  - 4 x daily - 7 x weekly - 2 sets - 10 reps - Sit to Stand with Counter Support  - 4 x daily - 7 x weekly - 10 reps - Seated Small Alternating Straight Leg Lifts with Heel Touch  - 4 x daily - 7 x weekly - 10 reps   ASSESSMENT:   CLINICAL  IMPRESSION: She continues to do well overall post op TKA. She has 2 more visits left and will likely be ready to discharge then. We will update HEP over these last 2 visits       OBJECTIVE IMPAIRMENTS Abnormal gait, decreased activity tolerance, decreased balance, decreased mobility, difficulty walking, decreased ROM, decreased strength, increased edema, impaired flexibility, and pain.    ACTIVITY LIMITATIONS sitting, standing, squatting, sleeping, stairs, transfers, and bed mobility   PARTICIPATION LIMITATIONS: meal prep, cleaning, laundry, and community activity   PERSONAL FACTORS anemia, arthritis, CAD, diverticulosis, HTN, Coronary/graft acute MI revascularization 2019, abdominla hysterectomy,  are also affecting patient's functional outcome.    REHAB POTENTIAL: Good   CLINICAL DECISION MAKING: Stable/uncomplicated   EVALUATION COMPLEXITY: Low     GOALS: Goals reviewed with patient? Yes   Short term PT Goals (target date for Short term goals are 3 weeks 02/15/22) Patient will demonstrate independent use of home exercise program to maintain progress from in clinic treatments. Goal status: MET 02/12/22   Pt will improve her 5 time to sit to stand to </= 15 seconds with/without UE support Goal Status:  MET now 10:05 now on 02/26/22   Long term PT goals (target dates for all long term goals are 12 weeks  04/19/22 )   1. Patient will demonstrate/report pain at worst less than or equal to 2/10 to facilitate minimal limitation in daily activity secondary to pain symptoms. Goal status: ongoing   2. Patient will demonstrate independent use  of home exercise program to facilitate ability to maintain/progress functional gains from skilled physical therapy services. Goal status: ongoing   3. Patient will demonstrate FOTO outcome > or = 68 % to indicate reduced disability due to condition. Goal status: ongoing   4.  Patient will demonstrate left  LE MMT 5/5 throughout to faciltiate usual  transfers, stairs, squatting at [facility] for daily life.    Goal status: ongoing   5.  pt will be able to navigate 1 flight of stairs with single hand rail with step over step pattern.    Goal status: ongoing   6.  Pt will improve left knee active ROM arc 2-115 degrees.    Goal status: ongoing   7. Pt will amb 500 feet with no device on level surfaces with normalized gait pattern.             A. Goal Status: ongoing           PLAN:   PT FREQUENCY: 2-3 x/week   PT DURATION: 12 weeks   PLANNED INTERVENTIONS: Therapeutic exercises, Therapeutic activity, Neuro Muscular re-education, Balance training, Gait training, Patient/Family education, Joint mobilization, Stair training, DME instructions, Dry Needling, Electrical stimulation, Traction, Cryotherapy, Moist heat, Taping, Ultrasound, Ionotophoresis 40m/ml Dexamethasone, and Manual therapy.  All included unless contraindicated     PLAN FOR NEXT SESSION: update HEP and transition to DC over last 2 visits  BDebbe Odea PT,DPT 02/26/2022, 11:28 AM

## 2022-02-28 ENCOUNTER — Encounter: Payer: Self-pay | Admitting: Physical Therapy

## 2022-02-28 ENCOUNTER — Ambulatory Visit: Payer: Medicare Other | Admitting: Physical Therapy

## 2022-02-28 DIAGNOSIS — M25562 Pain in left knee: Secondary | ICD-10-CM

## 2022-02-28 DIAGNOSIS — R600 Localized edema: Secondary | ICD-10-CM

## 2022-02-28 DIAGNOSIS — M25662 Stiffness of left knee, not elsewhere classified: Secondary | ICD-10-CM

## 2022-02-28 DIAGNOSIS — R262 Difficulty in walking, not elsewhere classified: Secondary | ICD-10-CM

## 2022-02-28 NOTE — Therapy (Signed)
OUTPATIENT PHYSICAL THERAPY TREATMENT NOTE   Patient Name: Jenna Thompson MRN: [id number] DOB:Jul 07, 1945, 76 y.o., female Today's Date: 02/28/2022  END OF SESSION:   PT End of Session - 02/28/22 1128     Visit Number 10    Number of Visits 24    Date for PT Re-Evaluation 04/19/22    Authorization Type UHC/Medicare    Progress Note Due on Visit 59    PT Start Time 1100    PT Stop Time 1138    PT Time Calculation (min) 38 min    Equipment Utilized During Treatment Gait belt    Activity Tolerance Patient tolerated treatment well    Behavior During Therapy WFL for tasks assessed/performed             Past Medical History:  Diagnosis Date   Anemia, iron deficiency    Arthritis    L>R knee   Atrophic gastritis without mention of hemorrhage    CAD (coronary artery disease)    a. s/p Inferior STEMI in 04/2017 with angioplasty and thrombectomy to mid-RCA, repeat cath later that admission with aspiration thrombectomy of rPDA and residual thrombus along distal vessel too small for intervention   Colon polyps    Diaphragmatic hernia without mention of obstruction or gangrene    Diverticulosis of colon (without mention of hemorrhage)    Hiatal hernia    Hypertension    Unspecified hemorrhoids without mention of complication    Past Surgical History:  Procedure Laterality Date   [place] N/A 05/02/2017   Procedure: CORONARY ANGIOGRAPHY (CATH LAB);  Surgeon: Nelva Bush, MD;  Location: [place] [facility];  Service: Cardiovascular;  Laterality: N/A;   CORONARY BALLOON ANGIOPLASTY N/A 05/02/2017   Procedure: CORONARY BALLOON ANGIOPLASTY;  Surgeon: Nelva Bush, MD;  Location: [place] [facility];  Service: Cardiovascular;  Laterality: N/A;   CORONARY THROMBECTOMY N/A 05/02/2017   Procedure: Coronary Thrombectomy;  Surgeon: Nelva Bush, MD;  Location: [place] [facility];  Service: Cardiovascular;   Laterality: N/A;   CORONARY/GRAFT ACUTE MI REVASCULARIZATION N/A 05/01/2017   Procedure: Coronary/Graft Acute MI Revascularization;  Surgeon: Burnell Blanks, MD;  Location: [place] [facility];  Service: Cardiovascular;  Laterality: N/A;   KNEE SURGERY  2001   left    LEFT HEART CATH AND CORONARY ANGIOGRAPHY N/A 05/01/2017   Procedure: LEFT HEART CATH AND CORONARY ANGIOGRAPHY;  Surgeon: Burnell Blanks, MD;  Location: [place] [facility];  Service: Cardiovascular;  Laterality: N/A;   TONSILLECTOMY AND ADENOIDECTOMY  1970   TOTAL KNEE ARTHROPLASTY Left 01/08/2022   Procedure: LEFT TOTAL KNEE ARTHROPLASTY;  Surgeon: Leandrew Koyanagi, MD;  Location: [employer];  Service: Orthopedics;  Laterality: Left;   Patient Active Problem List   Diagnosis Date Noted   Primary localized osteoarthritis of left knee 01/09/2022   Status post total left knee replacement 01/08/2022   Primary osteoarthritis of right knee 01/14/2020   Primary osteoarthritis of right hip 01/14/2020   Primary osteoarthritis of left knee 01/14/2020   Hyperlipidemia 05/05/2017   S/P PTCA (percutaneous transluminal coronary angioplasty)    Iron deficiency anemia    Acute myocardial infarction [facility])    Primary osteoarthritis of both knees 12/20/2016   Personal history of colonic polyps 12/26/2010     THERAPY DIAG:  Acute pain of left knee  Stiffness of left knee, not elsewhere classified  Difficulty in walking, not elsewhere classified  Localized edema  PCP: Donald Prose, MD   REFERRING PROVIDER: Leandrew Koyanagi, MD   REFERRING DIAG: [phone] (ICD-10-CM) - Primary localized osteoarthritis of left knee   Rationale for Evaluation and Treatment Rehabilitation   ONSET DATE: 01/08/22 left TKA SUBJECTIVE:    SUBJECTIVE STATEMENT: Pt arriving stating not too much pain today in her knee, no more than 4/10 now   PERTINENT HISTORY: 01/08/22 left TKA anemia, arthritis, CAD, diverticulosis, HTN, Coronary/graft acute MI  revascularization 2019, abdominla hysterectomy,    PAIN:  NPRS scale: 4/10 Pain location: left knee Pain description: achy, burning, throbbing Aggravating factors: bending, sitting too long Relieving factors: ice, pain meds   PRECAUTIONS: Fall   WEIGHT BEARING RESTRICTIONS No   FALLS:  Has patient fallen in last 6 months? No   LIVING ENVIRONMENT: Lives with: lives with their family Lives in: House/apartment Stairs: Yes: Internal: 1 flight steps; on left going up Has following equipment at home: Walker - 2 wheeled, st cane , raised toilet   OCCUPATION: retired   PLOF: Independent   PATIENT GOALS be able to walk without pain     OBJECTIVE:    DIAGNOSTIC FINDINGS: post op x-ray   PATIENT SURVEYS:  01/22/22: FOTO intake: 47%  predicted:  68%    COGNITION:           Overall cognitive status: WFL                    SENSATION: WFL   EDEMA:  01/22/22:  Circumferential: left: 44.5 centimeters                            Right 39 centimeters       POSTURE: rounded shoulders and forward head   PALPATION: Tenderness noted around entire left knee joint   LOWER EXTREMITY ROM:   A = Active ROM P=passive ROM Right 01/22/22 Left 01/22/22 Left 02/05/22 Left 02/19/22  Hip flexion        Hip extension        Hip abduction        Hip adduction        Hip internal rotation        Hip external rotation        Knee flexion A: 130 A: 78 P: 82 A:95 P:100 A:100 P:110  Knee extension A: 0 A: -8 P: -6 A:-5 P: A:0 P:0  Ankle dorsiflexion        Ankle plantarflexion        Ankle inversion        Ankle eversion         (Blank rows = not tested)   LOWER EXTREMITY MMT:   MMT Right 01/22/22 Left 01/22/22 Left 02/12/22 02/19/22  Hip flexion 5/5 3/5  4/5  Hip extension        Hip abduction 5/5 4/5    Hip adduction 5/5 4/5    Hip internal rotation        Hip external rotation        Knee flexion 5/5 3/5 4/5 4+  Knee extension 5/5 3/5 4/5 4+  Ankle dorsiflexion         Ankle plantarflexion        Ankle inversion        Ankle eversion         (Blank rows = not tested)     FUNCTIONAL TESTS:  01/22/22: 5 times sit to stand: 27 seconds c UE  support 02/12/22: 5 times STS 16.6 seconds with UE support 02/19/22 Sit to stand 5 times with UE support from standard chair 15.05 seconds 02/26/22: 5 times sit to stand with UE support 10.5 seconds   GAIT: Distance walked: 30 feet  Assistive device utilized: Environmental consultant - 2 wheeled Level of assistance: Modified independence Comments: antalgic step through gait pattern     TODAY'S TREATMENT: 02/28/22 -Nu step L5 10 min LE only -Seated knee flexion AAROM 5 sec hold X 15 -Seated leg extension machine 15# DL 3X10 -Seated hamstring curl machine 25# 2X15 -Stairs in the clinic reciprocal with handrails one flight -Seated SLR on left 2X12 -Leg press DL 75# 2X10, Leg press SL left only 37# 2X 10     PATIENT EDUCATION:  Education details: HEP, POC Person educated: Patient Education method: Consulting civil engineer, Demonstration, Verbal cues, and Handouts Education comprehension: verbalized understanding, returned demonstration, and verbal cues required       HOME EXERCISE PROGRAM: Access Code: [email or web address] URL: [email or web address] Date: 01/22/2022 Prepared by: Kearney Hard   Exercises - Supine Quad Set  - 4 x daily - 7 x weekly - 2 sets - 10 reps - 5 seconds hold - Supine Heel Slide with Strap  - 4 x daily - 7 x weekly - 2 sets - 10 reps - 5 seconds hold - Seated Long Arc Quad  - 4 x daily - 7 x weekly - 2 sets - 10 reps - 5 seconds hold - Seated Heel Slide  - 4 x daily - 7 x weekly - 2 sets - 10 reps - Sit to Stand with Counter Support  - 4 x daily - 7 x weekly - 10 reps - Seated Small Alternating Straight Leg Lifts with Heel Touch  - 4 x daily - 7 x weekly - 10 reps   ASSESSMENT:   CLINICAL IMPRESSION: She has one more visit scheduled and we plan to discharge to independent program then.     OBJECTIVE IMPAIRMENTS Abnormal gait, decreased activity tolerance, decreased balance, decreased mobility, difficulty walking, decreased ROM, decreased strength, increased edema, impaired flexibility, and pain.    ACTIVITY LIMITATIONS sitting, standing, squatting, sleeping, stairs, transfers, and bed mobility   PARTICIPATION LIMITATIONS: meal prep, cleaning, laundry, and community activity   PERSONAL FACTORS anemia, arthritis, CAD, diverticulosis, HTN, Coronary/graft acute MI revascularization 2019, abdominla hysterectomy,  are also affecting patient's functional outcome.    REHAB POTENTIAL: Good   CLINICAL DECISION MAKING: Stable/uncomplicated   EVALUATION COMPLEXITY: Low     GOALS: Goals reviewed with patient? Yes   Short term PT Goals (target date for Short term goals are 3 weeks 02/15/22) Patient will demonstrate independent use of home exercise program to maintain progress from in clinic treatments. Goal status: MET 02/12/22   Pt will improve her 5 time to sit to stand to </= 15 seconds with/without UE support Goal Status:  MET now 10:05 now on 02/26/22   Long term PT goals (target dates for all long term goals are 12 weeks  04/19/22 )   1. Patient will demonstrate/report pain at worst less than or equal to 2/10 to facilitate minimal limitation in daily activity secondary to pain symptoms. Goal status: ongoing   2. Patient will demonstrate independent use of home exercise program to facilitate ability to maintain/progress functional gains from skilled physical therapy services. Goal status: ongoing   3. Patient will demonstrate FOTO outcome > or = 68 % to indicate reduced disability due to condition. Goal status: ongoing   4.  Patient will demonstrate left  LE MMT 5/5 throughout to faciltiate usual transfers, stairs, squatting at [facility] for daily life.    Goal status: ongoing   5.  pt will be able to navigate 1 flight of stairs with single hand rail with step over step  pattern.    Goal status: ongoing   6.  Pt will improve left knee active ROM arc 2-115 degrees.    Goal status: ongoing   7. Pt will amb 500 feet with no device on level surfaces with normalized gait pattern.             A. Goal Status: ongoing           PLAN:   PT FREQUENCY: 2-3 x/week   PT DURATION: 12 weeks   PLANNED INTERVENTIONS: Therapeutic exercises, Therapeutic activity, Neuro Muscular re-education, Balance training, Gait training, Patient/Family education, Joint mobilization, Stair training, DME instructions, Dry Needling, Electrical stimulation, Traction, Cryotherapy, Moist heat, Taping, Ultrasound, Ionotophoresis 8m/ml Dexamethasone, and Manual therapy.  All included unless contraindicated     PLAN FOR NEXT SESSION: DC next visit, updated FOTO and goal BDebbe Odea PT,DPT 02/28/2022, 11:28 AM

## 2022-03-03 ENCOUNTER — Other Ambulatory Visit: Payer: Self-pay | Admitting: Cardiovascular Disease

## 2022-03-04 ENCOUNTER — Other Ambulatory Visit: Payer: Self-pay

## 2022-03-05 ENCOUNTER — Ambulatory Visit: Payer: Medicare Other | Admitting: Physical Therapy

## 2022-03-05 ENCOUNTER — Encounter: Payer: Self-pay | Admitting: Physical Therapy

## 2022-03-05 DIAGNOSIS — R262 Difficulty in walking, not elsewhere classified: Secondary | ICD-10-CM

## 2022-03-05 DIAGNOSIS — M25662 Stiffness of left knee, not elsewhere classified: Secondary | ICD-10-CM

## 2022-03-05 DIAGNOSIS — R600 Localized edema: Secondary | ICD-10-CM | POA: Diagnosis not present

## 2022-03-05 DIAGNOSIS — M25562 Pain in left knee: Secondary | ICD-10-CM | POA: Diagnosis not present

## 2022-03-05 NOTE — Therapy (Signed)
OUTPATIENT PHYSICAL THERAPY TREATMENT NOTE/Discharge PHYSICAL THERAPY DISCHARGE SUMMARY  Visits from Start of Care: 11  Current functional level related to goals / functional outcomes: See below   Remaining deficits: See below   Education / Equipment: HEP  Plan: Patient agrees to discharge.  Patient goals were  met. Patient is being discharged due to meeting the stated rehab goals.         Patient Name: Jenna Thompson MRN: [id number] DOB:[birth year], 76 y.o., female Today's Date: 03/05/2022  END OF SESSION:   PT End of Session - 03/05/22 1108     Visit Number 11    Number of Visits 24    Date for PT Re-Evaluation 04/19/22    Authorization Type UHC/Medicare    Progress Note Due on Visit 4    PT Start Time 1102    PT Stop Time 1140    PT Time Calculation (min) 38 min    Equipment Utilized During Treatment Gait belt    Activity Tolerance Patient tolerated treatment well    Behavior During Therapy WFL for tasks assessed/performed             Past Medical History:  Diagnosis Date   Anemia, iron deficiency    Arthritis    L>R knee   Atrophic gastritis without mention of hemorrhage    CAD (coronary artery disease)    a. s/p Inferior STEMI in 04/2017 with angioplasty and thrombectomy to mid-RCA, repeat cath later that admission with aspiration thrombectomy of rPDA and residual thrombus along distal vessel too small for intervention   Colon polyps    Diaphragmatic hernia without mention of obstruction or gangrene    Diverticulosis of colon (without mention of hemorrhage)    Hiatal hernia    Hypertension    Unspecified hemorrhoids without mention of complication    Past Surgical History:  Procedure Laterality Date   ABDOMINAL HYSTERECTOMY  [place]   CORONARY ANGIOGRAPHY N/A 05/02/2017   Procedure: CORONARY ANGIOGRAPHY (CATH LAB);  Surgeon: Nelva Bush, MD;  Location: [place] [facility];  Service: Cardiovascular;  Laterality: N/A;    CORONARY BALLOON ANGIOPLASTY N/A 05/02/2017   Procedure: CORONARY BALLOON ANGIOPLASTY;  Surgeon: Nelva Bush, MD;  Location: [place] [facility];  Service: Cardiovascular;  Laterality: N/A;   CORONARY THROMBECTOMY N/A 05/02/2017   Procedure: Coronary Thrombectomy;  Surgeon: Nelva Bush, MD;  Location: [place] [facility];  Service: Cardiovascular;  Laterality: N/A;   CORONARY/GRAFT ACUTE MI REVASCULARIZATION N/A 05/01/2017   Procedure: Coronary/Graft Acute MI Revascularization;  Surgeon: Burnell Blanks, MD;  Location: [place] [facility];  Service: Cardiovascular;  Laterality: N/A;   KNEE SURGERY  2001   left    LEFT HEART CATH AND CORONARY ANGIOGRAPHY N/A 05/01/2017   Procedure: LEFT HEART CATH AND CORONARY ANGIOGRAPHY;  Surgeon: Burnell Blanks, MD;  Location: [place] [facility];  Service: Cardiovascular;  Laterality: N/A;   TONSILLECTOMY AND ADENOIDECTOMY  1970   TOTAL KNEE ARTHROPLASTY Left 01/08/2022   Procedure: LEFT TOTAL KNEE ARTHROPLASTY;  Surgeon: Leandrew Koyanagi, MD;  Location: [place];  Service: Orthopedics;  Laterality: Left;   Patient Active Problem List   Diagnosis Date Noted   Primary localized osteoarthritis of left knee 01/09/2022   Status post total left knee replacement 01/08/2022   Primary osteoarthritis of right knee 01/14/2020   Primary osteoarthritis of right hip 01/14/2020   Primary osteoarthritis of left knee 01/14/2020   Hyperlipidemia 05/05/2017   S/P PTCA (percutaneous  transluminal coronary angioplasty)    Iron deficiency anemia    Acute myocardial infarction [facility])    Primary osteoarthritis of both knees 12/20/2016   Personal history of colonic polyps 12/26/2010     THERAPY DIAG:  Acute pain of left knee  Stiffness of left knee, not elsewhere classified  Difficulty in walking, not elsewhere classified  Localized edema  PCP: Donald Prose, MD   REFERRING PROVIDER: Leandrew Koyanagi, MD   REFERRING DIAG: [phone] (ICD-10-CM) - Primary  localized osteoarthritis of left knee   Rationale for Evaluation and Treatment Rehabilitation   ONSET DATE: 01/08/22 left TKA SUBJECTIVE:    SUBJECTIVE STATEMENT: Pt arriving stating she almost fell and twisted her knee and feels like she might have pulled a muscle, not too much pain though from this and still feels ready to DC today.   PERTINENT HISTORY: 01/08/22 left TKA anemia, arthritis, CAD, diverticulosis, HTN, Coronary/graft acute MI revascularization 2019, abdominla hysterectomy,    PAIN:  NPRS scale: 4/10 Pain location: left knee Pain description: achy, burning, throbbing Aggravating factors: bending, sitting too long Relieving factors: ice, pain meds   PRECAUTIONS: Fall   WEIGHT BEARING RESTRICTIONS No   FALLS:  Has patient fallen in last 6 months? No   LIVING ENVIRONMENT: Lives with: lives with their family Lives in: House/apartment Stairs: Yes: Internal: 1 flight steps; on left going up Has following equipment at home: Walker - 2 wheeled, st cane , raised toilet   OCCUPATION: retired   PLOF: Independent   PATIENT GOALS be able to walk without pain     OBJECTIVE:    DIAGNOSTIC FINDINGS: post op x-ray   PATIENT SURVEYS:  01/22/22: FOTO intake: 47%  predicted:  68% 03/05/22: FOTO 69%    COGNITION:           Overall cognitive status: WFL                    SENSATION: WFL   EDEMA:  01/22/22:  Circumferential: left: 44.5 centimeters                            Right 39 centimeters       POSTURE: rounded shoulders and forward head   PALPATION: Tenderness noted around entire left knee joint   LOWER EXTREMITY ROM:   A = Active ROM P=passive ROM Right 01/22/22 Left 01/22/22 Left 02/05/22 Left 02/19/22 Left 03/05/22  Hip flexion         Hip extension         Hip abduction         Hip adduction         Hip internal rotation         Hip external rotation         Knee flexion A: 130 A: 78 P: 82 A:95 P:100 A:100 P:110 P:115  Knee  extension A: 0 A: -8 P: -6 A:-5 P: A:0 P:0 A: 0  Ankle dorsiflexion         Ankle plantarflexion         Ankle inversion         Ankle eversion          (Blank rows = not tested)   LOWER EXTREMITY MMT:   MMT Right 01/22/22 Left 01/22/22 Left 02/12/22 02/19/22 03/05/22  Hip flexion 5/5 3/5  4/5   Hip extension         Hip abduction 5/5 4/5  Hip adduction 5/5 4/5     Hip internal rotation         Hip external rotation         Knee flexion 5/5 3/5 4/5 4+ 5  Knee extension 5/5 3/5 4/5 4+ 5  Ankle dorsiflexion         Ankle plantarflexion         Ankle inversion         Ankle eversion          (Blank rows = not tested)     FUNCTIONAL TESTS:  01/22/22: 5 times sit to stand: 27 seconds c UE support 02/12/22: 5 times STS 16.6 seconds with UE support 02/19/22 Sit to stand 5 times with UE support from standard chair 15.05 seconds 02/26/22: 5 times sit to stand with UE support 10.5 seconds   GAIT: Distance walked: 30 feet  Assistive device utilized: Environmental consultant - 2 wheeled Level of assistance: Modified independence Comments: antalgic step through gait pattern     TODAY'S TREATMENT: 03/05/22 -Nu step L5 8 min LE only -Seated knee flexion AAROM 5 sec hold X 15 -Seated leg extension machine 10# DL 3X10 -Seated hamstring curl machine 25# 2X15 -Sit to stand X 10 -Seated SLR on left 2X12 -Leg press DL 75# 2X10, Leg press SL left only 37# 2X 10     PATIENT EDUCATION:  Education details: HEP, POC Person educated: Patient Education method: Consulting civil engineer, Demonstration, Verbal cues, and Handouts Education comprehension: verbalized understanding, returned demonstration, and verbal cues required       HOME EXERCISE PROGRAM: Access Code: [email or web address] URL: [email or web address] Date: 01/22/2022 Prepared by: Kearney Hard   Exercises - Supine Quad Set  - 4 x daily - 7 x weekly - 2 sets - 10 reps - 5 seconds hold - Supine Heel Slide with Strap  - 4 x daily - 7 x  weekly - 2 sets - 10 reps - 5 seconds hold - Seated Long Arc Quad  - 4 x daily - 7 x weekly - 2 sets - 10 reps - 5 seconds hold - Seated Heel Slide  - 4 x daily - 7 x weekly - 2 sets - 10 reps - Sit to Stand with Counter Support  - 4 x daily - 7 x weekly - 10 reps - Seated Small Alternating Straight Leg Lifts with Heel Touch  - 4 x daily - 7 x weekly - 10 reps   ASSESSMENT:   CLINICAL IMPRESSION: She has met PT goals and she feels she is at a good functional level, she will be discharged to HEP today. She did not appear to have significant injury from her reported "twisting" of knee.   OBJECTIVE IMPAIRMENTS Abnormal gait, decreased activity tolerance, decreased balance, decreased mobility, difficulty walking, decreased ROM, decreased strength, increased edema, impaired flexibility, and pain.    ACTIVITY LIMITATIONS sitting, standing, squatting, sleeping, stairs, transfers, and bed mobility   PARTICIPATION LIMITATIONS: meal prep, cleaning, laundry, and community activity   PERSONAL FACTORS anemia, arthritis, CAD, diverticulosis, HTN, Coronary/graft acute MI revascularization 2019, abdominla hysterectomy,  are also affecting patient's functional outcome.    REHAB POTENTIAL: Good   CLINICAL DECISION MAKING: Stable/uncomplicated   EVALUATION COMPLEXITY: Low     GOALS: Goals reviewed with patient? Yes   Short term PT Goals (target date for Short term goals are 3 weeks 02/15/22) Patient will demonstrate independent use of home exercise program to maintain progress from in clinic treatments. Goal status: MET 02/12/22   Pt will  improve her 5 time to sit to stand to </= 15 seconds with/without UE support Goal Status:  MET now 10:05 now on 02/26/22   Long term PT goals (target dates for all long term goals are 12 weeks  04/19/22 )   1. Patient will demonstrate/report pain at worst less than or equal to 2/10 to facilitate minimal limitation in daily activity secondary to pain symptoms. Goal  status MET   2. Patient will demonstrate independent use of home exercise program to facilitate ability to maintain/progress functional gains from skilled physical therapy services. Goal status: MET   3. Patient will demonstrate FOTO outcome > or = 68 % to indicate reduced disability due to condition. Goal status: MET, improved to 69% on 03/05/22   4.  Patient will demonstrate left  LE MMT 5/5 throughout to faciltiate usual transfers, stairs, squatting at [facility] for daily life.    Goal status: MET   5.  pt will be able to navigate 1 flight of stairs with single hand rail with step over step pattern.    Goal status: MET   6.  Pt will improve left knee active ROM arc 2-115 degrees.    Goal status: MET    7. Pt will amb 500 feet with no device on level surfaces with normalized gait pattern.             A. Goal Status: MET         PLAN:   PT FREQUENCY: 2-3 x/week   PT DURATION: 12 weeks   PLANNED INTERVENTIONS: Therapeutic exercises, Therapeutic activity, Neuro Muscular re-education, Balance training, Gait training, Patient/Family education, Joint mobilization, Stair training, DME instructions, Dry Needling, Electrical stimulation, Traction, Cryotherapy, Moist heat, Taping, Ultrasound, Ionotophoresis 87m/ml Dexamethasone, and Manual therapy.  All included unless contraindicated     PLAN FOR NEXT SESSION: DC today  BDebbe Odea PT,DPT 03/05/2022, 11:09 AM

## 2022-03-08 ENCOUNTER — Other Ambulatory Visit: Payer: Self-pay | Admitting: Cardiovascular Disease

## 2022-03-11 ENCOUNTER — Telehealth: Payer: Self-pay | Admitting: Cardiovascular Disease

## 2022-03-11 MED ORDER — ATORVASTATIN CALCIUM 80 MG PO TABS: 80.0000 mg | ORAL_TABLET | Freq: Every day | ORAL | 2 refills | Status: DC

## 2022-03-11 NOTE — Telephone Encounter (Signed)
*  STAT* If patient is at the pharmacy, call can be transferred to refill team.   1. Which medications need to be refilled? (please list name of each medication and dose if known)   atorvastatin (LIPITOR) 80 MG tablet    2. Which pharmacy/location (including street and city if local pharmacy) is medication to be sent to? [facility] - [place], [place] - [address]   3. Do they need a 30 day or 90 day supply?  90 day

## 2022-03-11 NOTE — Telephone Encounter (Signed)
Refill for Atorvastatin has been sent to [facility].

## 2022-04-04 ENCOUNTER — Ambulatory Visit: Payer: Medicare Other | Admitting: Orthopaedic Surgery

## 2022-04-04 DIAGNOSIS — H401132 Primary open-angle glaucoma, bilateral, moderate stage: Secondary | ICD-10-CM | POA: Diagnosis not present

## 2022-04-04 DIAGNOSIS — Z96652 Presence of left artificial knee joint: Secondary | ICD-10-CM

## 2022-04-04 NOTE — Progress Notes (Signed)
Post-Op Visit Note   Patient: Jenna Thompson           Date of Birth: 02-16-1946           MRN: [id number] Visit Date: 04/04/2022 PCP: Deatra James, MD   Assessment & Plan:  Chief Complaint:  Chief Complaint  Patient presents with   Left Knee - Routine Post Op   Visit Diagnoses:  1. Status post total left knee replacement     Plan: 3 month TKA follow up plan  Patient is now 3 months status post right total knee arthroplasty. She's doing well.  She experiences some swelling with increased activity.  Denies any night time pain.  Overall very pleased with the outcome.    Wound is healed with no signs of complications or infection.  The patient does not complain of pain, and is back to normal daily activities. It was reinforced that prophylactic antibiotics should be taken with any procedure including but not limited to dental work or colonoscopies.  We will plan on following up at the 6 month postop visit with 2 view xrays of the operative knee at that time. As always, instructions were given to call with any questions or concerns in the interim.   Follow-Up Instructions: Return in about 3 months (around 07/04/2022).   Orders:  No orders of the defined types were placed in this encounter.  No orders of the defined types were placed in this encounter.   Imaging: No results found.  PMFS History: Patient Active Problem List   Diagnosis Date Noted   Primary localized osteoarthritis of left knee 01/09/2022   Status post total left knee replacement 01/08/2022   Primary osteoarthritis of right knee 01/14/2020   Primary osteoarthritis of right hip 01/14/2020   Primary osteoarthritis of left knee 01/14/2020   Hyperlipidemia 05/05/2017   S/P PTCA (percutaneous transluminal coronary angioplasty)    Iron deficiency anemia    Acute myocardial infarction [facility])    Primary osteoarthritis of both knees 12/20/2016   Personal history of colonic polyps 12/26/2010   Past Medical History:   Diagnosis Date   Anemia, iron deficiency    Arthritis    L>R knee   Atrophic gastritis without mention of hemorrhage    CAD (coronary artery disease)    a. s/p Inferior STEMI in 04/2017 with angioplasty and thrombectomy to mid-RCA, repeat cath later that admission with aspiration thrombectomy of rPDA and residual thrombus along distal vessel too small for intervention   Colon polyps    Diaphragmatic hernia without mention of obstruction or gangrene    Diverticulosis of colon (without mention of hemorrhage)    Hiatal hernia    Hypertension    Unspecified hemorrhoids without mention of complication     Family History  Problem Relation Age of Onset   Kidney disease Mother    Colon cancer Neg Hx    Breast cancer Neg Hx     Past Surgical History:  Procedure Laterality Date   ABDOMINAL HYSTERECTOMY  1978   CHOLECYSTECTOMY  1979   CORONARY ANGIOGRAPHY N/A 05/02/2017   Procedure: CORONARY ANGIOGRAPHY (CATH LAB);  Surgeon: Yvonne Kendall, MD;  [facility];  Service: Cardiovascular;  Laterality: N/A;   CORONARY BALLOON ANGIOPLASTY N/A 05/02/2017   Procedure: CORONARY BALLOON ANGIOPLASTY;  Surgeon: Yvonne Kendall, MD;  Location: [facility];  Service: Cardiovascular;  Laterality: N/A;   CORONARY THROMBECTOMY N/A 05/02/2017   Procedure: Coronary Thrombectomy;  Surgeon: Yvonne Kendall, MD;  Location:  [facility];  Service: Cardiovascular;  Laterality: N/A;   CORONARY/GRAFT ACUTE MI REVASCULARIZATION N/A 05/01/2017   Procedure: Coronary/Graft Acute MI Revascularization;  Surgeon: Kathleene Hazel, MD;  Location: [facility];  Service: Cardiovascular;  Laterality: N/A;   KNEE SURGERY  2001   left    LEFT HEART CATH AND CORONARY ANGIOGRAPHY N/A 05/01/2017   Procedure: LEFT HEART CATH AND CORONARY ANGIOGRAPHY;  Surgeon: Kathleene Hazel, MD;  Location: [facility];  Service: Cardiovascular;  Laterality: N/A;   TONSILLECTOMY AND  ADENOIDECTOMY  1970   TOTAL KNEE ARTHROPLASTY Left 01/08/2022   Procedure: LEFT TOTAL KNEE ARTHROPLASTY;  Surgeon: Tarry Kos, MD;  Location: [facility];  Service: Orthopedics;  Laterality: Left;   Social History   Occupational History   Occupation: Retired  Tobacco Use   Smoking status: Former   Smokeless tobacco: Never  Building services engineer Use: Never used  Substance and Sexual Activity   Alcohol use: No   Drug use: No   Sexual activity: Not on file

## 2022-04-05 DIAGNOSIS — J320 Chronic maxillary sinusitis: Secondary | ICD-10-CM | POA: Diagnosis not present

## 2022-05-10 DIAGNOSIS — E7800 Pure hypercholesterolemia, unspecified: Secondary | ICD-10-CM | POA: Diagnosis not present

## 2022-05-10 DIAGNOSIS — Z0000 Encounter for general adult medical examination without abnormal findings: Secondary | ICD-10-CM | POA: Diagnosis not present

## 2022-05-10 DIAGNOSIS — Z23 Encounter for immunization: Secondary | ICD-10-CM | POA: Diagnosis not present

## 2022-05-10 DIAGNOSIS — D649 Anemia, unspecified: Secondary | ICD-10-CM | POA: Diagnosis not present

## 2022-05-10 DIAGNOSIS — I10 Essential (primary) hypertension: Secondary | ICD-10-CM | POA: Diagnosis not present

## 2022-05-28 IMAGING — MG MM DIGITAL SCREENING BILAT W/ TOMO AND CAD
8 series · 8 of 24 positions shown · non-contrast
Comparison: None.

CLINICAL DATA: Screening.

EXAM:
DIGITAL SCREENING BILATERAL MAMMOGRAM WITH TOMOSYNTHESIS AND CAD
TECHNIQUE: Bilateral screening digital craniocaudal and mediolateral oblique
mammograms were obtained. Bilateral screening digital breast
tomosynthesis was performed. The images were evaluated with
computer-aided detection.

[R MLO synth-2D]
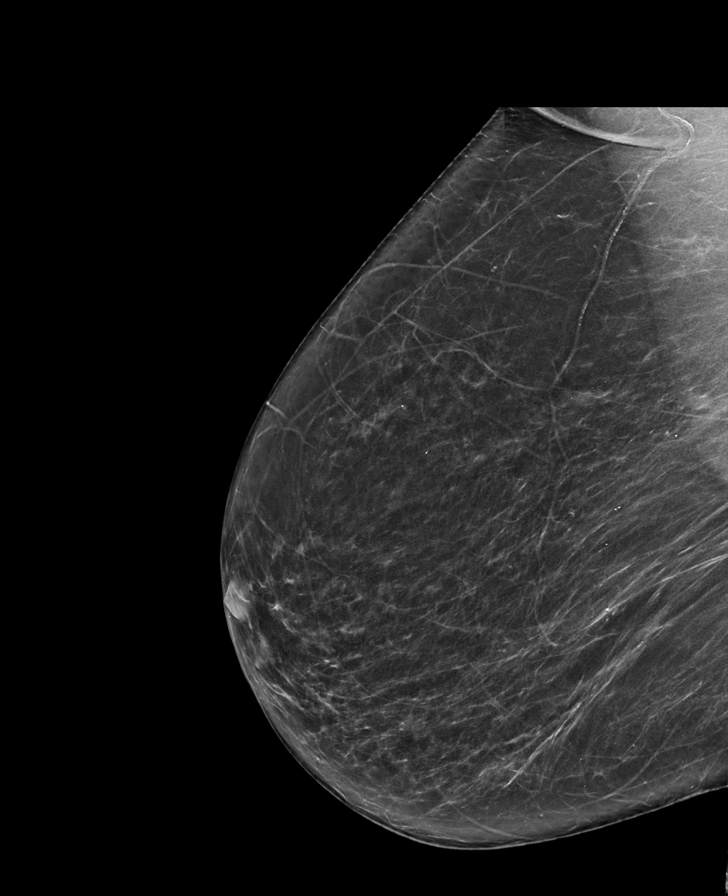

[L CC synth-2D]
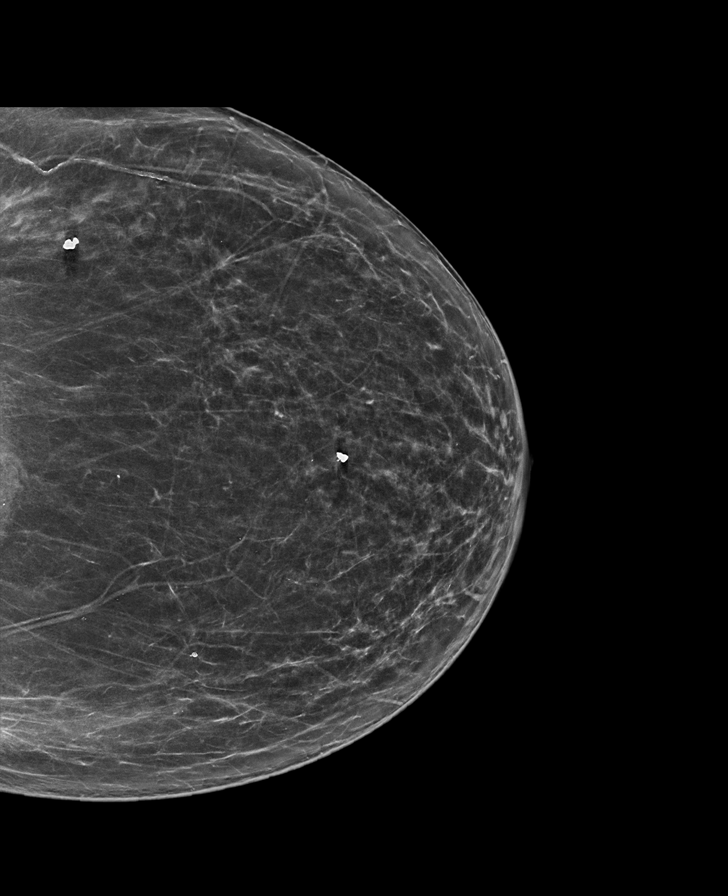

[L MLO synth-2D]
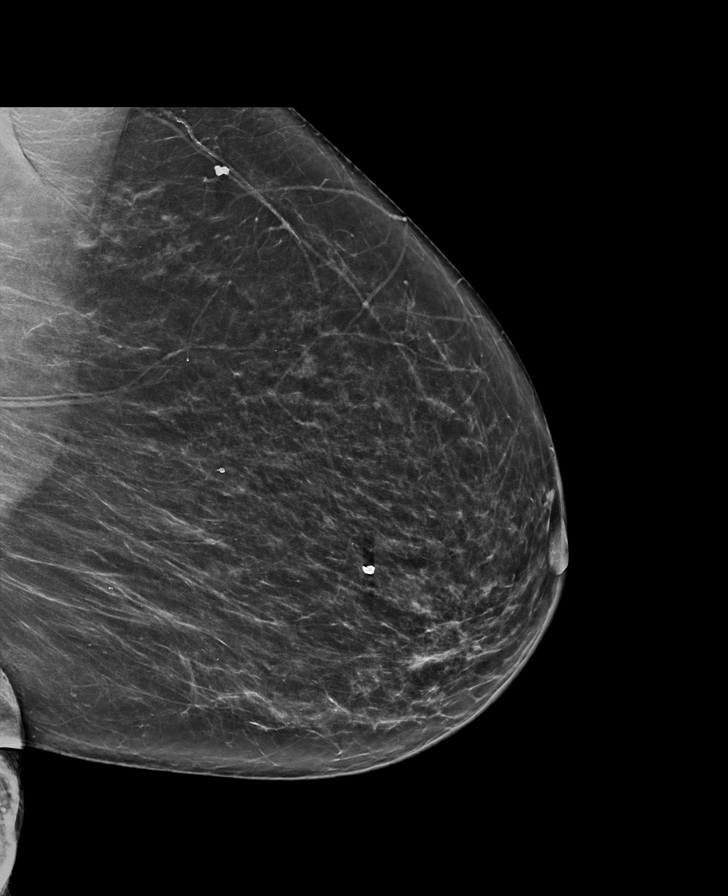

[R CC synth-2D]
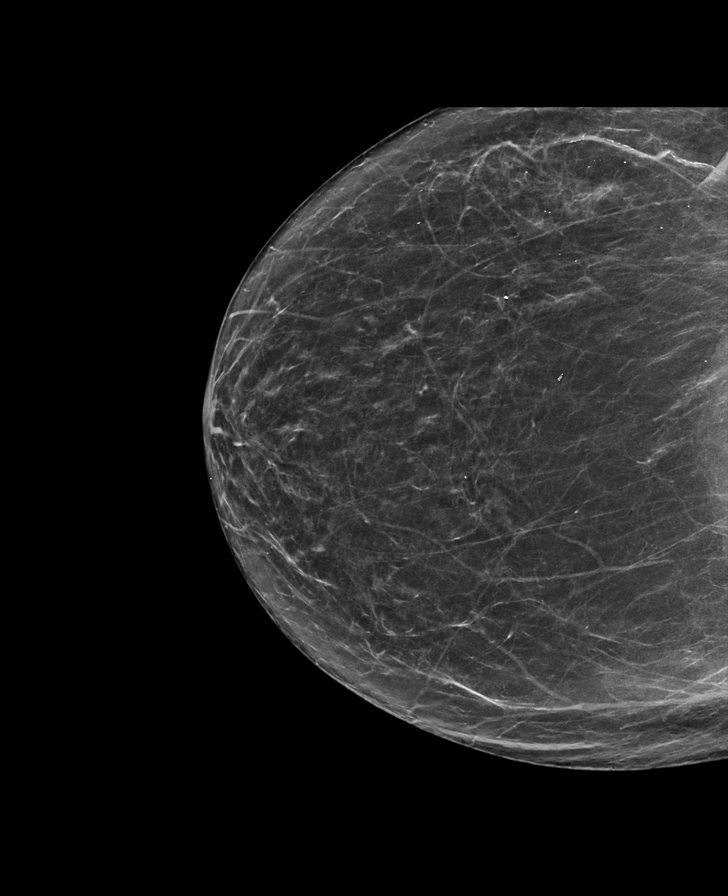

[L MLO tomo · tomo slice 37/74.0]
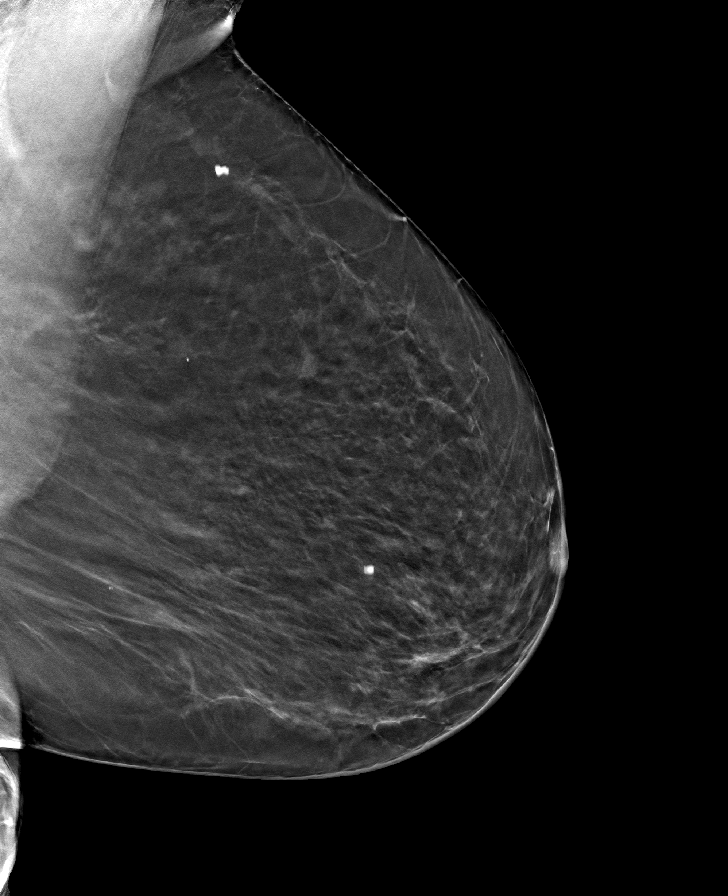

[R CC tomo · tomo slice 35/69.0]
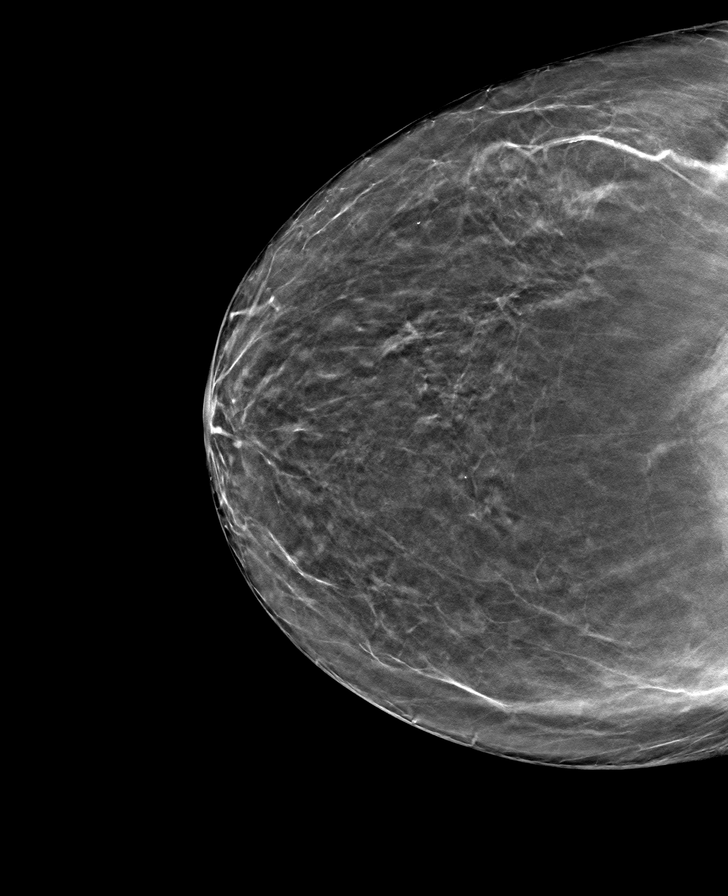

[L CC tomo · tomo slice 35/69.0]
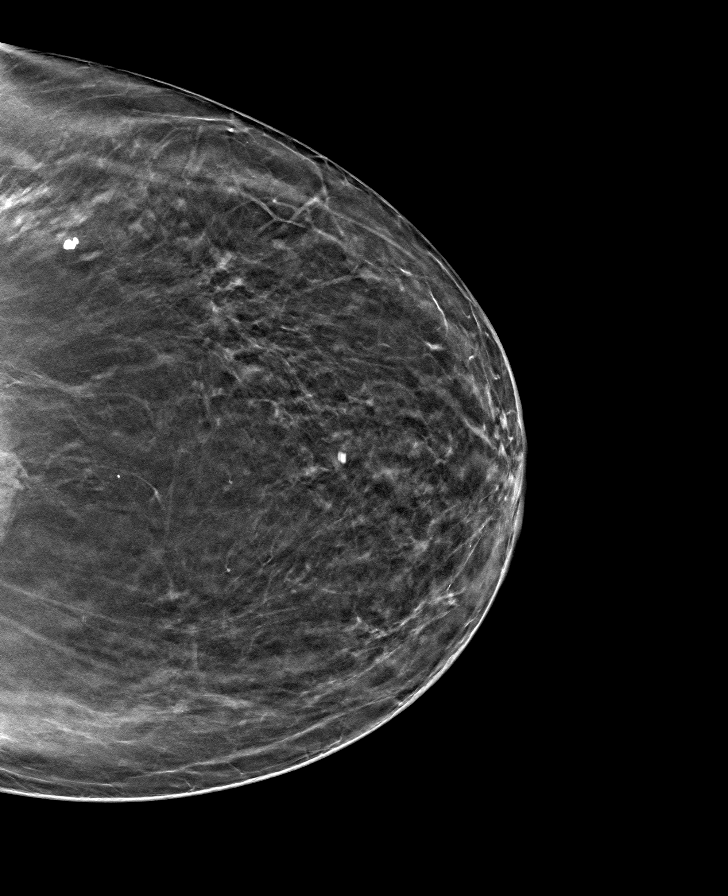

[R MLO tomo · tomo slice 36/71.0]
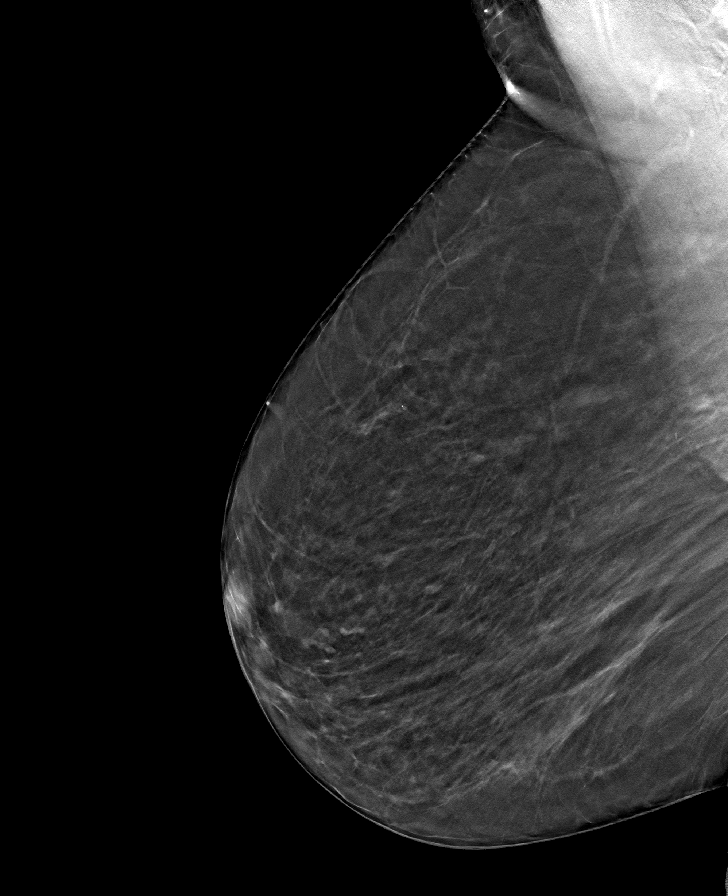

[8 of 24 positions shown; findings below may reference images not displayed]

ACR Breast Density Category b: There are scattered areas of
fibroglandular density.
FINDINGS: In the left breast, a possible asymmetry warrants further
evaluation. In the right breast, no findings suspicious for
malignancy.
IMPRESSION: Further evaluation is suggested for possible asymmetry in the left
breast.

RECOMMENDATION:
Diagnostic mammogram and possibly ultrasound of the left breast.
(Code:[id number]-[id number])

The patient will be contacted regarding the findings, and additional
imaging will be scheduled.

BI-RADS CATEGORY  0: Incomplete. Need additional imaging evaluation
and/or prior mammograms for comparison.

## 2022-06-05 DIAGNOSIS — R808 Other proteinuria: Secondary | ICD-10-CM | POA: Diagnosis not present

## 2022-06-05 IMAGING — US US BREAST*L* LIMITED INC AXILLA
1 series · 6 of 6 positions shown · non-contrast
Comparison: None.

CLINICAL DATA: Patient returns today to evaluate a possible LEFT
breast asymmetry questioned on baseline screening mammogram.

EXAM:
DIGITAL DIAGNOSTIC UNILATERAL LEFT MAMMOGRAM WITH TOMOSYNTHESIS AND
CAD; ULTRASOUND LEFT BREAST LIMITED
TECHNIQUE: Left digital diagnostic mammography and breast tomosynthesis was
performed. The images were evaluated with computer-aided detection.;
Targeted ultrasound examination of the left breast was performed.

[Series 1: us breast*left* limited inc axilla · 0.07mm/px · 6 of 6 slices shown]
[im 1/6]
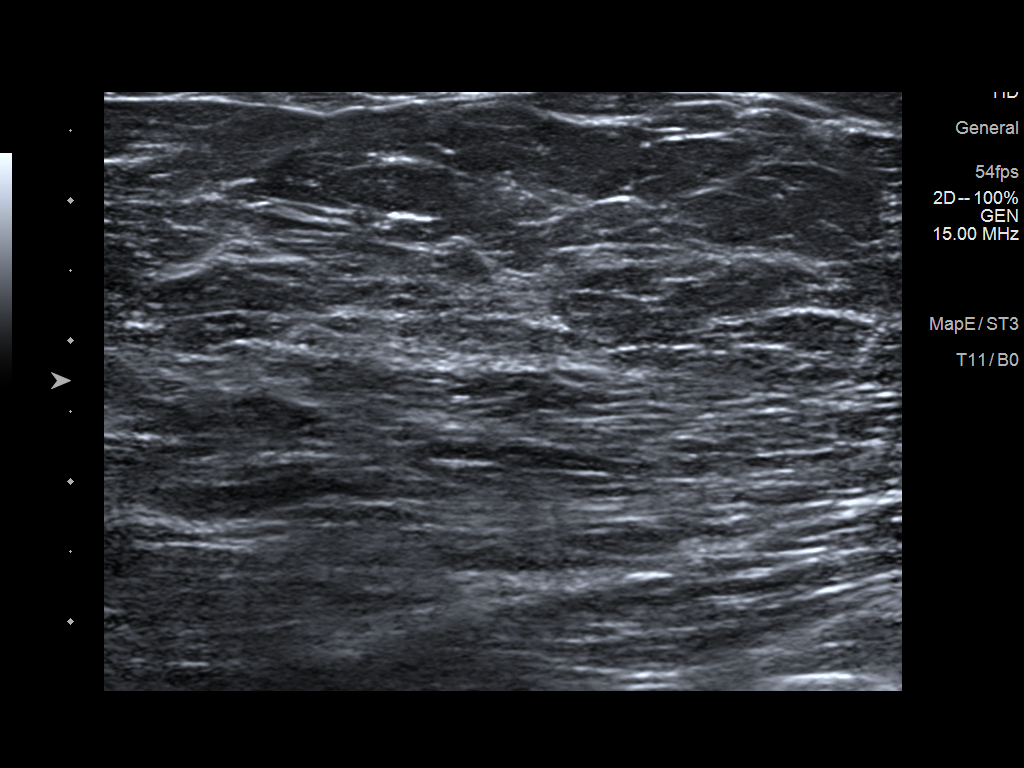
[im 2/6]
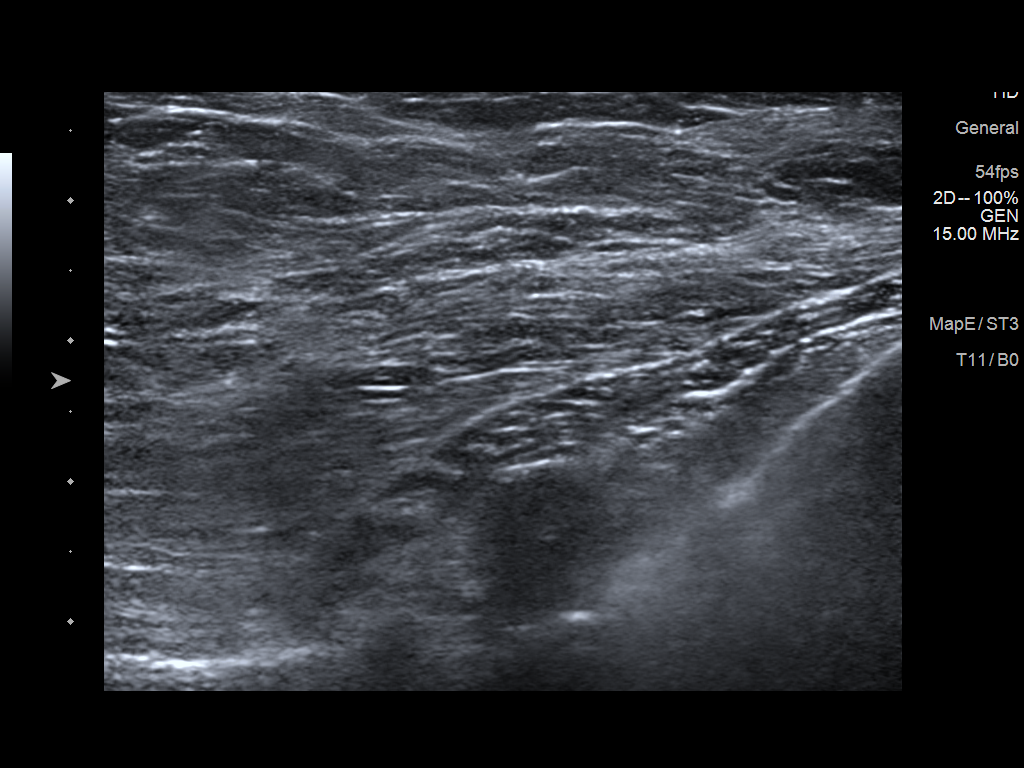
[im 3/6]
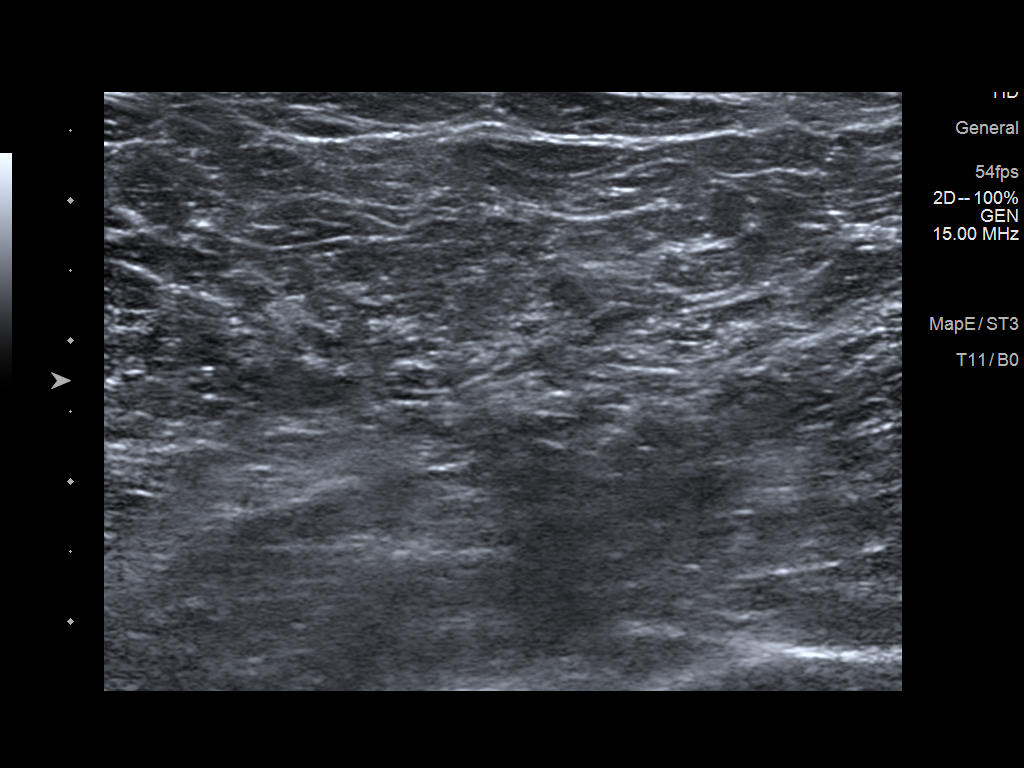
[im 4/6]
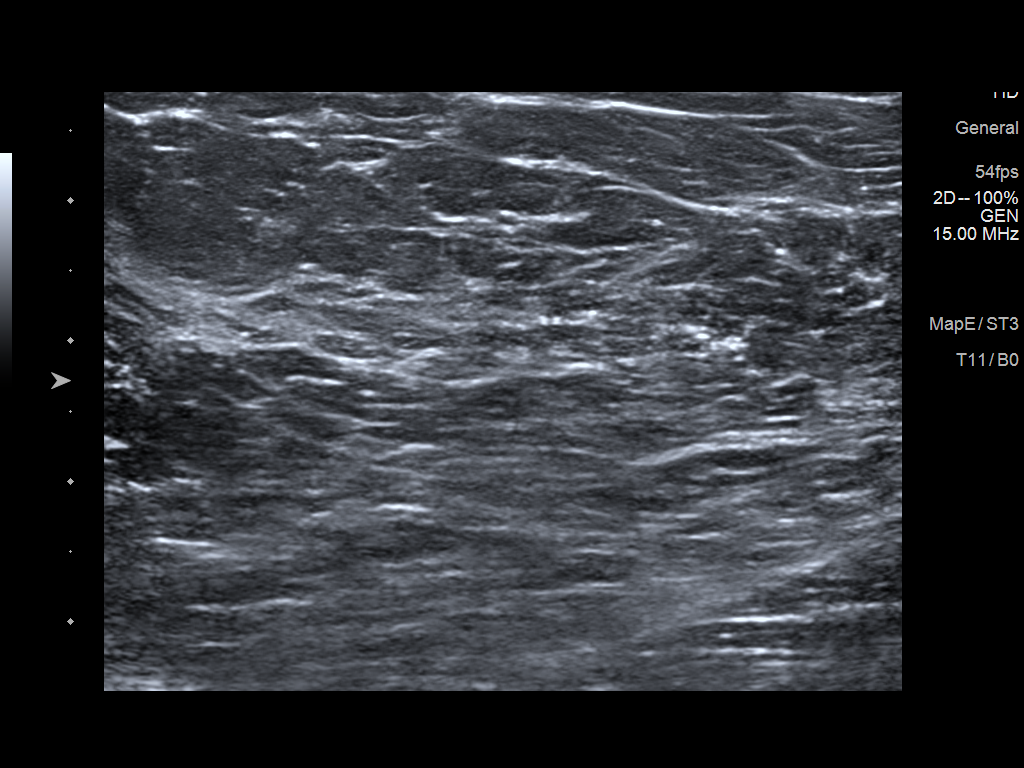
[im 5/6]
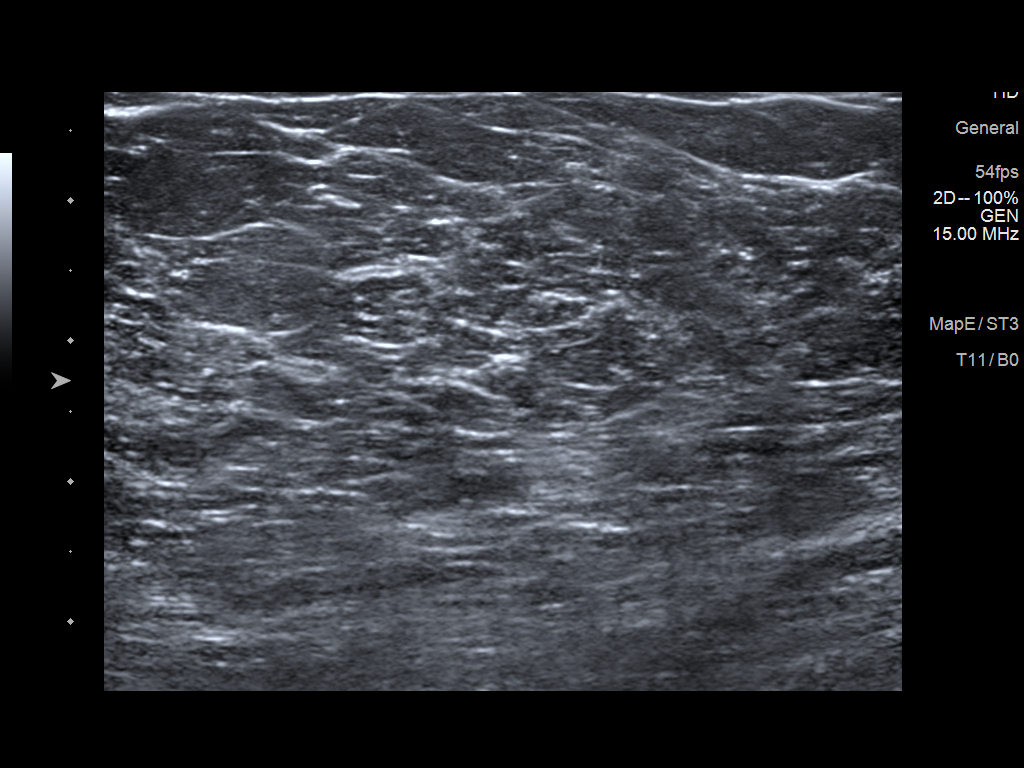
[im 6/6]
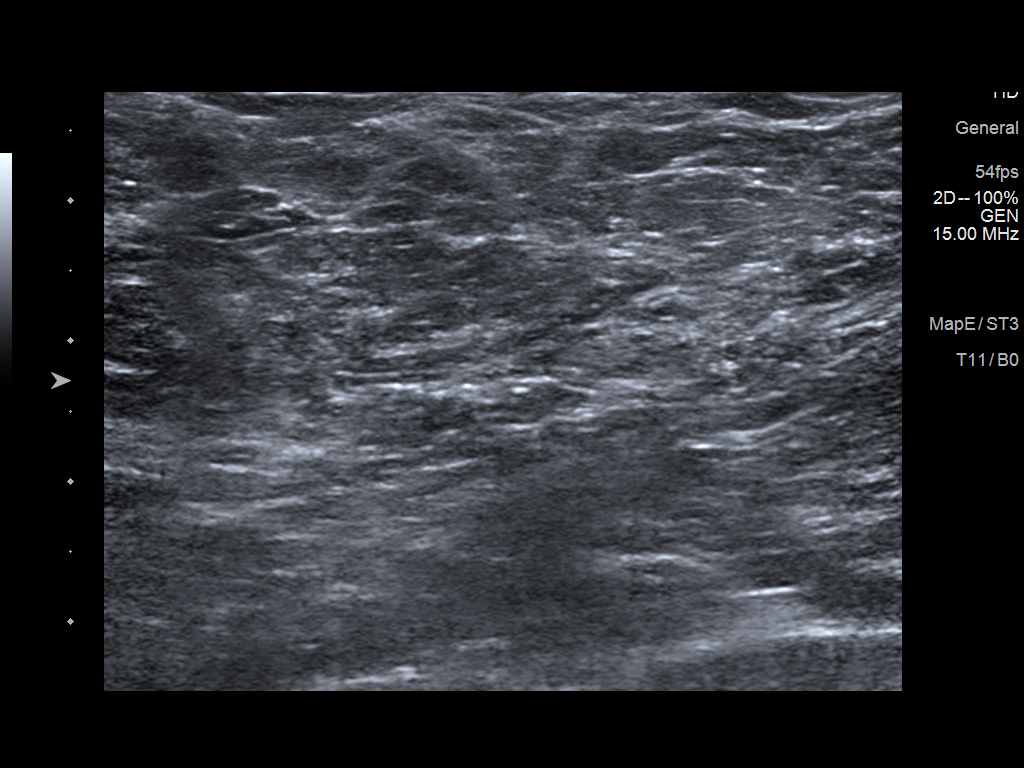

[6 of 6 positions shown; findings below may reference images not displayed]

ACR Breast Density Category b: There are scattered areas of
fibroglandular density.
FINDINGS: On today's additional diagnostic views, including spot compression
views with 3D tomosynthesis and a true lateral view, the questioned
asymmetry is most suggestive of an island of normal fibroglandular
tissues.

Targeted ultrasound is performed, evaluating the upper-outer LEFT
breast, showing only normal fibroglandular tissues and fat lobules
throughout. No solid or cystic mass.
IMPRESSION: Probably benign island of normal fibroglandular tissues within the
upper-outer quadrant of the LEFT breast. Recommend follow-up LEFT
breast diagnostic mammogram in 6 months to ensure stability.

RECOMMENDATION:
LEFT breast diagnostic mammogram in 6 months.

I have discussed the findings and recommendations with the patient.
If applicable, a reminder letter will be sent to the patient
regarding the next appointment.

BI-RADS CATEGORY  3: Probably benign.

## 2022-06-20 ENCOUNTER — Ambulatory Visit
Admission: RE | Admit: 2022-06-20 | Discharge: 2022-06-20 | Disposition: A | Discharge status: Home / Self Care | Payer: Medicare Other | Source: Ambulatory Visit | Attending: Family Medicine | Admitting: Family Medicine

## 2022-06-20 DIAGNOSIS — R928 Other abnormal and inconclusive findings on diagnostic imaging of breast: Secondary | ICD-10-CM | POA: Diagnosis not present

## 2022-06-20 DIAGNOSIS — N6489 Other specified disorders of breast: Secondary | ICD-10-CM

## 2022-06-25 DIAGNOSIS — H2513 Age-related nuclear cataract, bilateral: Secondary | ICD-10-CM | POA: Diagnosis not present

## 2022-06-25 DIAGNOSIS — H401132 Primary open-angle glaucoma, bilateral, moderate stage: Secondary | ICD-10-CM | POA: Diagnosis not present

## 2022-07-04 ENCOUNTER — Other Ambulatory Visit (INDEPENDENT_AMBULATORY_CARE_PROVIDER_SITE_OTHER): Payer: Medicare Other

## 2022-07-04 ENCOUNTER — Ambulatory Visit: Payer: Medicare Other | Admitting: Orthopaedic Surgery

## 2022-07-04 ENCOUNTER — Encounter: Payer: Self-pay | Admitting: Orthopaedic Surgery

## 2022-07-04 DIAGNOSIS — Z96652 Presence of left artificial knee joint: Secondary | ICD-10-CM | POA: Diagnosis not present

## 2022-07-04 NOTE — Progress Notes (Signed)
Office Visit Note   Patient: Jenna Thompson           Date of Birth: [birth year]           MRN: [id number] Visit Date: 07/04/2022              Requested by: Donald Prose, MD [place],  [place] [address] PCP: Donald Prose, MD   Assessment & Plan: Visit Diagnoses:  1. Status post total left knee replacement     Plan: Tarron is now 6 months status post left total knee replacement.  She is doing great.  Tenolysis reinforced.  Recheck in 6 months with two-view x-rays of the left knee.  Follow-Up Instructions: Return in about 6 months (around 01/04/2023).   Orders:  Orders Placed This Encounter  Procedures   XR Knee 1-2 Views Left   No orders of the defined types were placed in this encounter.     Procedures: No procedures performed   Clinical Data: No additional findings.   Subjective: Chief Complaint  Patient presents with   Left Knee - Follow-up    Left total knee arthroplasty 01/08/2022    HPI  Terran is 6 months status post left total knee replacement.  Overall doing very well.  Has no complaints.  Review of Systems   Objective: Vital Signs: There were no vitals taken for this visit.  Physical Exam  Ortho Exam  Examination left knee shows fully healed surgical scar.  Excellent range of motion from 0 to greater than 120 degrees.  Good varus valgus stability.  Specialty Comments:  No specialty comments available.  Imaging: XR Knee 1-2 Views Left  Result Date: 07/04/2022 Stable left total knee replacement in good alignment.     PMFS History: Patient Active Problem List   Diagnosis Date Noted   Primary localized osteoarthritis of left knee 01/09/2022   Status post total left knee replacement 01/08/2022   Primary osteoarthritis of right knee 01/14/2020   Primary osteoarthritis of right hip 01/14/2020   Primary osteoarthritis of left knee 01/14/2020   Hyperlipidemia 05/05/2017   S/P PTCA (percutaneous transluminal coronary  angioplasty)    Iron deficiency anemia    Acute myocardial infarction [facility])    Primary osteoarthritis of both knees 12/20/2016   Personal history of colonic polyps 12/26/2010   Past Medical History:  Diagnosis Date   Anemia, iron deficiency    Arthritis    L>R knee   Atrophic gastritis without mention of hemorrhage    CAD (coronary artery disease)    a. s/p Inferior STEMI in 04/2017 with angioplasty and thrombectomy to mid-RCA, repeat cath later that admission with aspiration thrombectomy of rPDA and residual thrombus along distal vessel too small for intervention   Colon polyps    Diaphragmatic hernia without mention of obstruction or gangrene    Diverticulosis of colon (without mention of hemorrhage)    Hiatal hernia    Hypertension    Unspecified hemorrhoids without mention of complication     Family History  Problem Relation Age of Onset   Kidney disease Mother    Colon cancer Neg Hx    Breast cancer Neg Hx     Past Surgical History:  Procedure Laterality Date   ABDOMINAL HYSTERECTOMY  1978   CHOLECYSTECTOMY  1979   CORONARY ANGIOGRAPHY N/A 05/02/2017   Procedure: CORONARY ANGIOGRAPHY (CATH LAB);  Surgeon: Nelva Bush, MD;  Location: [place] [facility];  Service: Cardiovascular;  Laterality: N/A;  CORONARY BALLOON ANGIOPLASTY N/A 05/02/2017   Procedure: CORONARY BALLOON ANGIOPLASTY;  Surgeon: Nelva Bush, MD;  Location: [place] [facility];  Service: Cardiovascular;  Laterality: N/A;   CORONARY THROMBECTOMY N/A 05/02/2017   Procedure: Coronary Thrombectomy;  Surgeon: Nelva Bush, MD;  Location: [place] [facility];  Service: Cardiovascular;  Laterality: N/A;   CORONARY/GRAFT ACUTE MI REVASCULARIZATION N/A 05/01/2017   Procedure: Coronary/Graft Acute MI Revascularization;  Surgeon: Burnell Blanks, MD;  Location: [place] [facility];  Service: Cardiovascular;  Laterality: N/A;   KNEE SURGERY  2001   left    LEFT HEART CATH AND CORONARY ANGIOGRAPHY N/A  05/01/2017   Procedure: LEFT HEART CATH AND CORONARY ANGIOGRAPHY;  Surgeon: Burnell Blanks, MD;  Location: [place] [facility];  Service: Cardiovascular;  Laterality: N/A;   TONSILLECTOMY AND ADENOIDECTOMY  1970   TOTAL KNEE ARTHROPLASTY Left 01/08/2022   Procedure: LEFT TOTAL KNEE ARTHROPLASTY;  Surgeon: Leandrew Koyanagi, MD;  Location: [place];  Service: Orthopedics;  Laterality: Left;   Social History   Occupational History   Occupation: Retired  Tobacco Use   Smoking status: Former   Smokeless tobacco: Never  Scientific laboratory technician Use: Never used  Substance and Sexual Activity   Alcohol use: No   Drug use: No   Sexual activity: Not on file

## 2022-08-11 ENCOUNTER — Other Ambulatory Visit: Payer: Self-pay | Admitting: Cardiovascular Disease

## 2022-12-20 DIAGNOSIS — H2513 Age-related nuclear cataract, bilateral: Secondary | ICD-10-CM | POA: Diagnosis not present

## 2022-12-20 DIAGNOSIS — H401132 Primary open-angle glaucoma, bilateral, moderate stage: Secondary | ICD-10-CM | POA: Diagnosis not present

## 2023-01-07 ENCOUNTER — Encounter: Payer: Self-pay | Admitting: Physician Assistant

## 2023-01-07 ENCOUNTER — Ambulatory Visit: Payer: Medicare Other | Admitting: Physician Assistant

## 2023-01-07 ENCOUNTER — Other Ambulatory Visit (INDEPENDENT_AMBULATORY_CARE_PROVIDER_SITE_OTHER): Payer: Medicare Other

## 2023-01-07 DIAGNOSIS — Z96652 Presence of left artificial knee joint: Secondary | ICD-10-CM

## 2023-01-07 NOTE — Progress Notes (Signed)
Post-Op Visit Note   Patient: Jenna Thompson           Date of Birth: [birth year]           MRN: [id number] Visit Date: 01/07/2023 PCP: Deatra James, MD   Assessment & Plan:  Chief Complaint:  Chief Complaint  Patient presents with   Left Knee - Follow-up   Visit Diagnoses:  1. Status post total left knee replacement     Plan: Patient is a pleasant 77 year old female who comes in today 1 year status post left total knee replacement 01/08/2022.  She has been doing well.  No complaints.  Examination of the left knee reveals range of motion of 0 to 125 degrees.  Stable to valgus varus stress.  She is neurovascular intact distally.  At this point, she will continue to advance with activity as tolerated.  She does have an upcoming colonoscopy in January and will let us know a week before where we will send in antibiotics.  Follow-Up Instructions: Return in about 1 year (around 01/07/2024).   Orders:  Orders Placed This Encounter  Procedures   XR Knee 1-2 Views Left   No orders of the defined types were placed in this encounter.   Imaging: XR Knee 1-2 Views Left  Result Date: 01/07/2023 Well-seated prosthesis without complication   PMFS History: Patient Active Problem List   Diagnosis Date Noted   Primary localized osteoarthritis of left knee 01/09/2022   Status post total left knee replacement 01/08/2022   Primary osteoarthritis of right knee 01/14/2020   Primary osteoarthritis of right hip 01/14/2020   Primary osteoarthritis of left knee 01/14/2020   Hyperlipidemia 05/05/2017   S/P PTCA (percutaneous transluminal coronary angioplasty)    Iron deficiency anemia    Acute myocardial infarction [facility])    Primary osteoarthritis of both knees 12/20/2016   Personal history of colonic polyps 12/26/2010   Past Medical History:  Diagnosis Date   Anemia, iron deficiency    Arthritis    L>R knee   Atrophic gastritis without mention of hemorrhage    CAD (coronary artery disease)     a. s/p Inferior STEMI in 04/2017 with angioplasty and thrombectomy to mid-RCA, repeat cath later that admission with aspiration thrombectomy of rPDA and residual thrombus along distal vessel too small for intervention   Colon polyps    Diaphragmatic hernia without mention of obstruction or gangrene    Diverticulosis of colon (without mention of hemorrhage)    Hiatal hernia    Hypertension    Unspecified hemorrhoids without mention of complication     Family History  Problem Relation Age of Onset   Kidney disease Mother    Colon cancer Neg Hx    Breast cancer Neg Hx     Past Surgical History:  Procedure Laterality Date   ABDOMINAL HYSTERECTOMY  1978   CHOLECYSTECTOMY  1979   CORONARY ANGIOGRAPHY N/A 05/02/2017   Procedure: CORONARY ANGIOGRAPHY (CATH LAB);  Surgeon: Yvonne Kendall, MD;  [facility];  Service: Cardiovascular;  Laterality: N/A;   CORONARY BALLOON ANGIOPLASTY N/A 05/02/2017   Procedure: CORONARY BALLOON ANGIOPLASTY;  Surgeon: Yvonne Kendall, MD;  [facility]: [facility];  Service: Cardiovascular;  Laterality: N/A;   CORONARY THROMBECTOMY N/A 05/02/2017   Procedure: Coronary Thrombectomy;  Surgeon: Yvonne Kendall, MD;  [facility];  Service: Cardiovascular;  Laterality: N/A;   CORONARY/GRAFT ACUTE MI REVASCULARIZATION N/A 05/01/2017   Procedure: Coronary/Graft Acute MI Revascularization;  Surgeon: Clifton James,  Nile Dear, MD;  [facility];  Service: Cardiovascular;  Laterality: N/A;   KNEE SURGERY  2001   left    LEFT HEART CATH AND CORONARY ANGIOGRAPHY N/A 05/01/2017   Procedure: LEFT HEART CATH AND CORONARY ANGIOGRAPHY;  Surgeon: Kathleene Hazel, MD;  [facility];  Service: Cardiovascular;  Laterality: N/A;   TONSILLECTOMY AND ADENOIDECTOMY  1970   TOTAL KNEE ARTHROPLASTY Left 01/08/2022   Procedure: LEFT TOTAL KNEE ARTHROPLASTY;  Surgeon: Tarry Kos, MD;  Location: [facility];  Service:  Orthopedics;  Laterality: Left;   Social History   Occupational History   Occupation: Retired  Tobacco Use   Smoking status: Former   Smokeless tobacco: Never  Advertising account planner   Vaping status: Never Used  Substance and Sexual Activity   Alcohol use: No   Drug use: No   Sexual activity: Not on file

## 2023-01-12 NOTE — Progress Notes (Unsigned)
No chief complaint on file.  History of Present Illness: 77 yo female with history of CAD, HTN, prior DVT and anemia who is here today for cardiac follow up. She was admitted to [facility] on 05/01/17 with an acute inferior STEMI. Emergent cath with thrombotic occlusion of the mid to distal RCA treated with balloon angioplasty and thrombectomy. No stent was placed. She was treated with Aggrastat for 18 hours and then had repeat cath on 05/02/17 at which time the PDA was treated with aspiration thrombectomy. Echo 05/05/17 with LVEF=55%, inferior wall hypokinesis.   She is here today for follow up. The patient denies any chest pain, dyspnea, palpitations, lower extremity edema, orthopnea, PND, dizziness, near syncope or syncope.   Primary Care Physician: Deatra James, MD  Past Medical History:  Diagnosis Date   Anemia, iron deficiency    Arthritis    L>R knee   Atrophic gastritis without mention of hemorrhage    CAD (coronary artery disease)    a. s/p Inferior STEMI in 04/2017 with angioplasty and thrombectomy to mid-RCA, repeat cath later that admission with aspiration thrombectomy of rPDA and residual thrombus along distal vessel too small for intervention   Colon polyps    Diaphragmatic hernia without mention of obstruction or gangrene    Diverticulosis of colon (without mention of hemorrhage)    Hiatal hernia    Hypertension    Unspecified hemorrhoids without mention of complication     Past Surgical History:  Procedure Laterality Date   ABDOMINAL HYSTERECTOMY  1978   CHOLECYSTECTOMY  1979   CORONARY ANGIOGRAPHY N/A 05/02/2017   Procedure: CORONARY ANGIOGRAPHY (CATH LAB);  Surgeon: Yvonne Kendall, MD;  [facility];  Service: Cardiovascular;  Laterality: N/A;   CORONARY BALLOON ANGIOPLASTY N/A 05/02/2017   Procedure: CORONARY BALLOON ANGIOPLASTY;  Surgeon: Yvonne Kendall, MD;  [facility];  Service: Cardiovascular;  Laterality: N/A;   CORONARY  THROMBECTOMY N/A 05/02/2017   Procedure: Coronary Thrombectomy;  Surgeon: Yvonne Kendall, MD;  [facility];  Service: Cardiovascular;  Laterality: N/A;   CORONARY/GRAFT ACUTE MI REVASCULARIZATION N/A 05/01/2017   Procedure: Coronary/Graft Acute MI Revascularization;  Surgeon: Kathleene Hazel, MD;  [facility];  Service: Cardiovascular;  Laterality: N/A;   KNEE SURGERY  2001   left    LEFT HEART CATH AND CORONARY ANGIOGRAPHY N/A 05/01/2017   Procedure: LEFT HEART CATH AND CORONARY ANGIOGRAPHY;  Surgeon: Kathleene Hazel, MD;  [facility];  Service: Cardiovascular;  Laterality: N/A;   TONSILLECTOMY AND ADENOIDECTOMY  1970   TOTAL KNEE ARTHROPLASTY Left 01/08/2022   Procedure: LEFT TOTAL KNEE ARTHROPLASTY;  Surgeon: Tarry Kos, MD;  Location: [facility];  Service: Orthopedics;  Laterality: Left;    Current Outpatient Medications  Medication Sig Dispense Refill   aspirin 81 MG chewable tablet Chew 1 tablet (81 mg total) by mouth daily.     atorvastatin (LIPITOR) 80 MG tablet TAKE 1 TABLET BY MOUTH DAILY 100 tablet 1   Calcium Carbonate Antacid 1177 MG CHEW Chew 1,177 mg by mouth as needed (as needed for heartburn).     clopidogrel (PLAVIX) 75 MG tablet TAKE 1 TABLET BY MOUTH  DAILY 100 tablet 2   cyanocobalamin (VITAMIN B12) 1000 MCG tablet Take 1,000 mcg by mouth daily.     HYDROcodone-acetaminophen (NORCO) 5-325 MG tablet Take 1-2 tablets by mouth 2 (two) times daily as needed. 30 tablet 0   latanoprost (XALATAN) 0.005 % ophthalmic  solution 1 drop.     methocarbamol (ROBAXIN-750) 750 MG tablet Take 1 tablet (750 mg total) by mouth 2 (two) times daily as needed for muscle spasms. 30 tablet 2   metoprolol tartrate (LOPRESSOR) 25 MG tablet TAKE 1 TABLET BY MOUTH  TWICE DAILY 180 tablet 3   Multiple Vitamins-Minerals (MULTIVITAMIN WITH MINERALS) tablet Take 1 tablet by mouth daily.     nitroGLYCERIN (NITROSTAT) 0.4 MG SL tablet DISSOLVE  1 TABLET UNDER THE  TONGUE EVERY 5 MINUTES AS NEEDED FOR CHEST PAIN. MAX OF 3 TABLETS IN 15 MINUTES. CALL 911 IF PAIN  PERSISTS. 25 tablet 0   ondansetron (ZOFRAN) 4 MG tablet Take 1 tablet (4 mg total) by mouth every 8 (eight) hours as needed for nausea or vomiting. 40 tablet 0   oxyCODONE-acetaminophen (PERCOCET) 5-325 MG tablet Take 1-2 tablets by mouth every 8 (eight) hours as needed. To be taken after surgery 40 tablet 0   timolol (TIMOPTIC) 0.5 % ophthalmic solution Place 1 drop into both eyes daily.     No current facility-administered medications for this visit.    Allergies  Allergen Reactions   Macrobid [Nitrofurantoin Monohyd Macro] Nausea And Vomiting and Other (See Comments)    Stomach pain   Sulfa Antibiotics Nausea And Vomiting and Other (See Comments)    Lethargic, tired    Social History   Socioeconomic History   Marital status: Widowed    Spouse name: Not on file   Number of children: 4   Years of education: Not on file   Highest education level: Not on file  Occupational History   Occupation: Retired  Tobacco Use   Smoking status: Former   Smokeless tobacco: Never  Advertising account planner   Vaping status: Never Used  Substance and Sexual Activity   Alcohol use: No   Drug use: No   Sexual activity: Not on file  Other Topics Concern   Not on file  Social History Narrative   2 caffeine drinks daily   Social Determinants of Health   Financial Resource Strain: Not on file  Food Insecurity: No Food Insecurity (01/09/2022)   Hunger Vital Sign    Worried About Running Out of Food in the Last Year: Never true    Ran Out of Food in the Last Year: Never true  Transportation Needs: No Transportation Needs (01/09/2022)   PRAPARE - Administrator, Civil Service (Medical): No    Lack of Transportation (Non-Medical): No  Physical Activity: Not on file  Stress: Not on file  Social Connections: Not on file  Intimate Partner Violence: Not At Risk (01/09/2022)    Humiliation, Afraid, Rape, and Kick questionnaire    Fear of Current or Ex-Partner: No    Emotionally Abused: No    Physically Abused: No    Sexually Abused: No    Family History  Problem Relation Age of Onset   Kidney disease Mother    Colon cancer Neg Hx    Breast cancer Neg Hx     Review of Systems:  As stated in the HPI and otherwise negative.   There were no vitals taken for this visit.  Physical Examination: General: Well developed, well nourished, NAD  HEENT: OP clear, mucus membranes moist  SKIN: warm, dry. No rashes. Neuro: No focal deficits  Musculoskeletal: Muscle strength 5/5 all ext  Psychiatric: Mood and affect normal  Neck: No JVD, no carotid bruits, no thyromegaly, no lymphadenopathy.  Lungs:Clear bilaterally, no wheezes, rhonci, crackles Cardiovascular: Regular  rate and rhythm. No murmurs, gallops or rubs. Abdomen:Soft. Bowel sounds present. Non-tender.  Extremities: No lower extremity edema. Pulses are 2 + in the bilateral DP/PT.  Echo January 2019: Left ventricle: Inferobasal hypokinesis The cavity size was   normal. Wall thickness was increased in a pattern of moderate   LVH. Systolic function was normal. The estimated ejection   fraction was 55%. Wall motion was normal; there were no regional   wall motion abnormalities. Doppler parameters are consistent with   abnormal left ventricular relaxation (grade 1 diastolic   dysfunction). - Mitral valve: There was mild regurgitation. - Left atrium: The atrium was mildly dilated. - Atrial septum: No defect or patent foramen ovale was identified. - Pulmonary arteries: PA peak pressure: 33 mm Hg (S).  EKG:  EKG is *** ordered today. The ekg ordered today demonstrates   Recent Labs: No results found for requested labs within last 365 days.   Lipid Panel    Component Value Date/Time   CHOL 130 11/28/2021 1207   TRIG 95 11/28/2021 1207   HDL 41 11/28/2021 1207   CHOLHDL 3.2 11/28/2021 1207   CHOLHDL  4.6 05/01/2017 0637   VLDL 20 05/01/2017 0637   LDLCALC 71 11/28/2021 1207     Wt Readings from Last 3 Encounters:  01/08/22 80.3 kg  01/03/22 80.5 kg  11/28/21 80.3 kg    Assessment and Plan:   1. CAD without angina: She had an inferior STEMI in January 2019 secondary to thrombotic occlusion of hte mid RCA. This was treated with balloon angioplasty and thrombectomy. No stent was placed. Mild disease noted in the LAD and Circumflex. No chest pain. Continue ASA, Plavix, statin and beta blocker.   2. HLD: LDL ***. Continue statin  3. HTN: BP is controlled. No changes  Labs/ tests ordered today include:  No orders of the defined types were placed in this encounter.  Disposition:   F/U with me in 12 months  Signed, Verne Carrow, MD 01/12/2023 5:48 PM    [facility] [address] [place], [place], [place]  [address] Phone: [phone]; Fax: [phone]

## 2023-01-13 ENCOUNTER — Ambulatory Visit: Payer: Medicare Other | Attending: Cardiovascular Disease | Admitting: Cardiovascular Disease

## 2023-01-13 ENCOUNTER — Encounter: Payer: Self-pay | Admitting: Cardiovascular Disease

## 2023-01-13 VITALS — BP 142/88 | HR 67 | Ht 70.0 in | Wt 169.0 lb

## 2023-01-13 DIAGNOSIS — I10 Essential (primary) hypertension: Secondary | ICD-10-CM | POA: Diagnosis not present

## 2023-01-13 DIAGNOSIS — E782 Mixed hyperlipidemia: Secondary | ICD-10-CM | POA: Diagnosis not present

## 2023-01-13 DIAGNOSIS — I2510 Atherosclerotic heart disease of native coronary artery without angina pectoris: Secondary | ICD-10-CM | POA: Diagnosis not present

## 2023-01-13 NOTE — Patient Instructions (Signed)

## 2023-02-19 ENCOUNTER — Other Ambulatory Visit: Payer: Self-pay | Admitting: Cardiovascular Disease

## 2023-02-19 ENCOUNTER — Other Ambulatory Visit: Payer: Self-pay | Admitting: Physician Assistant

## 2023-05-16 DIAGNOSIS — Z23 Encounter for immunization: Secondary | ICD-10-CM | POA: Diagnosis not present

## 2023-05-16 DIAGNOSIS — E7800 Pure hypercholesterolemia, unspecified: Secondary | ICD-10-CM | POA: Diagnosis not present

## 2023-05-16 DIAGNOSIS — D649 Anemia, unspecified: Secondary | ICD-10-CM | POA: Diagnosis not present

## 2023-05-16 DIAGNOSIS — Z1211 Encounter for screening for malignant neoplasm of colon: Secondary | ICD-10-CM | POA: Diagnosis not present

## 2023-05-16 DIAGNOSIS — R7303 Prediabetes: Secondary | ICD-10-CM | POA: Diagnosis not present

## 2023-05-16 DIAGNOSIS — Z0000 Encounter for general adult medical examination without abnormal findings: Secondary | ICD-10-CM | POA: Diagnosis not present

## 2023-05-16 DIAGNOSIS — I10 Essential (primary) hypertension: Secondary | ICD-10-CM | POA: Diagnosis not present

## 2023-05-29 ENCOUNTER — Telehealth: Payer: Self-pay | Admitting: *Deleted

## 2023-05-29 DIAGNOSIS — Z7901 Long term (current) use of anticoagulants: Secondary | ICD-10-CM | POA: Diagnosis not present

## 2023-05-29 DIAGNOSIS — Z86718 Personal history of other venous thrombosis and embolism: Secondary | ICD-10-CM | POA: Diagnosis not present

## 2023-05-29 NOTE — Telephone Encounter (Signed)
   Pre-operative Risk Assessment    Patient Name: Jenna Thompson  DOB: [birth year] MRN: [id number]   Date of last office visit: 01/13/23 DR. McALHANY Date of next office visit: NONE    Request for Surgical Clearance    Procedure:   COLONOSCOPY  Date of Surgery:  Clearance 07/21/23                                Surgeon:  DR. Dulce Sellar Surgeon's Group or Practice Name:  [facility] Phone number:  [phone] Fax number:  [phone]   Type of Clearance Requested:   - Medical  - Pharmacy:  Hold Clopidogrel (Plavix)     Type of Anesthesia:   PROPOFOL   Additional requests/questions:    Elpidio Anis   05/29/2023, 10:43 AM

## 2023-05-29 NOTE — Telephone Encounter (Signed)
   Name: Jenna Thompson  DOB: 1945-08-28  MRN: [id number]  Primary Cardiologist: Verne Carrow, MD   Preoperative team, please contact this patient and set up a phone call appointment for further preoperative risk assessment. Please obtain consent and complete medication review. Thank you for your help.  I confirm that guidance regarding antiplatelet and oral anticoagulation therapy has been completed and, if necessary, noted below.  Regarding ASA therapy, we recommend continuation of ASA throughout the perioperative period.  However, if the surgeon feels that cessation of ASA is required in the perioperative period, it may be stopped 5-7 days prior to surgery with a plan to resume it as soon as felt to be feasible from a surgical standpoint in the post-operative period.   Per office protocol, if patient is without any new symptoms or concerns at the time of their virtual visit, she may hold Plavix for 5 days prior to procedure. Please resume Plavix as soon as possible postprocedure, at the discretion of the surgeon.    I also confirmed the patient resides in the state of [place]. As per [facility] Medical Board telemedicine laws, the patient must reside in the state in which the provider is licensed.   Denyce Robert, NP 05/29/2023, 4:41 PM [facility]

## 2023-05-30 NOTE — Telephone Encounter (Signed)
Called patient to set up a tele visit, no answer left a vm

## 2023-05-30 NOTE — Telephone Encounter (Signed)
Left message to call back to schedule tele pre op appt.

## 2023-06-02 ENCOUNTER — Telehealth: Payer: Self-pay

## 2023-06-02 NOTE — Telephone Encounter (Signed)
Patient has been scheduled for telephone appt

## 2023-06-02 NOTE — Telephone Encounter (Signed)
 Patient is scheduled for telephone appt and consent is done     Patient Consent for Virtual Visit         Jenna Thompson has provided verbal consent on 06/02/2023 for a virtual visit (video or telephone).   CONSENT FOR VIRTUAL VISIT FOR:  Jenna Thompson  By participating in this virtual visit I agree to the following:  I hereby voluntarily request, consent and authorize [facility] and its employed or contracted physicians, physician assistants, nurse practitioners or other licensed health care professionals (the Practitioner), to provide me with telemedicine health care services (the "Services") as deemed necessary by the treating Practitioner. I acknowledge and consent to receive the Services by the Practitioner via telemedicine. I understand that the telemedicine visit will involve communicating with the Practitioner through live audiovisual communication technology and the disclosure of certain medical information by electronic transmission. I acknowledge that I have been given the opportunity to request an in-person assessment or other available alternative prior to the telemedicine visit and am voluntarily participating in the telemedicine visit.  I understand that I have the right to withhold or withdraw my consent to the use of telemedicine in the course of my care at any time, without affecting my right to future care or treatment, and that the Practitioner or I may terminate the telemedicine visit at any time. I understand that I have the right to inspect all information obtained and/or recorded in the course of the telemedicine visit and may receive copies of available information for a reasonable fee.  I understand that some of the potential risks of receiving the Services via telemedicine include:  Delay or interruption in medical evaluation due to technological equipment failure or disruption; Information transmitted may not be sufficient (e.g. poor resolution of images) to  allow for appropriate medical decision making by the Practitioner; and/or  In rare instances, security protocols could fail, causing a breach of personal health information.  Furthermore, I acknowledge that it is my responsibility to provide information about my medical history, conditions and care that is complete and accurate to the best of my ability. I acknowledge that Practitioner's advice, recommendations, and/or decision may be based on factors not within their control, such as incomplete or inaccurate data provided by me or distortions of diagnostic images or specimens that may result from electronic transmissions. I understand that the practice of medicine is not an exact science and that Practitioner makes no warranties or guarantees regarding treatment outcomes. I acknowledge that a copy of this consent can be made available to me via my patient portal [facility] MyChart), or I can request a printed copy by calling the office of [facility].    I understand that my insurance will be billed for this visit.   I have read or had this consent read to me. I understand the contents of this consent, which adequately explains the benefits and risks of the Services being provided via telemedicine.  I have been provided ample opportunity to ask questions regarding this consent and the Services and have had my questions answered to my satisfaction. I give my informed consent for the services to be provided through the use of telemedicine in my medical care

## 2023-06-06 DIAGNOSIS — H401132 Primary open-angle glaucoma, bilateral, moderate stage: Secondary | ICD-10-CM | POA: Diagnosis not present

## 2023-06-06 DIAGNOSIS — H25813 Combined forms of age-related cataract, bilateral: Secondary | ICD-10-CM | POA: Diagnosis not present

## 2023-06-11 ENCOUNTER — Other Ambulatory Visit: Payer: Self-pay | Admitting: Physician Assistant

## 2023-06-11 ENCOUNTER — Telehealth: Payer: Self-pay | Admitting: Orthopaedic Surgery

## 2023-06-11 MED ORDER — AMOXICILLIN 500 MG PO CAPS: ORAL_CAPSULE | ORAL | 2 refills | Status: DC

## 2023-06-11 NOTE — Telephone Encounter (Signed)
 Patient called advised she is having her colonoscopy 07/21/2023 and will need an antibiotic called into her pharmacy prior to the procedure. Patient uses [facility] [address]. The number to contact patient is  [phone]

## 2023-06-11 NOTE — Telephone Encounter (Signed)
 sent

## 2023-06-11 NOTE — Telephone Encounter (Signed)
 Notified patient.

## 2023-06-25 DIAGNOSIS — H401132 Primary open-angle glaucoma, bilateral, moderate stage: Secondary | ICD-10-CM | POA: Diagnosis not present

## 2023-07-07 ENCOUNTER — Ambulatory Visit: Payer: Medicare Other | Attending: Nurse Practitioner | Admitting: Nurse Practitioner

## 2023-07-07 DIAGNOSIS — Z01810 Encounter for preprocedural cardiovascular examination: Secondary | ICD-10-CM

## 2023-07-07 NOTE — Progress Notes (Signed)
 Virtual Visit via Telephone Note   Because of RIKITA GRABERT co-morbid illnesses, she is at least at moderate risk for complications without adequate follow up.  This format is felt to be most appropriate for this patient at this time.  Due to technical limitations with video connection (technology), today's appointment will be conducted as an audio only telehealth visit, and MALLY GAVINA verbally agreed to proceed in this manner.   All issues noted in this document were discussed and addressed.  No physical exam could be performed with this format.  Evaluation Performed:  Preoperative cardiovascular risk assessment _____________   Date:  07/07/2023   Patient ID:  Jenna Thompson, DOB 1945/10/29, MRN [id number] Patient Location:  Home Provider location:   Office  Primary Care Provider:  Deatra James, MD Primary Cardiologist:  Verne Carrow, MD  Chief Complaint / Patient Profile   78 y.o. y/o female with a h/o CAD s/p acute inferior STEMI with balloon angioplasty and thrombectomy to mid to distal RCA, no stent placed, hypertension, hyperlipidemia who is pending colonoscopy with Dr. Dulce Sellar on 07/21/23 and presents today for telephonic preoperative cardiovascular risk assessment.  History of Present Illness    Jenna Thompson is a 78 y.o. female who presents via audio/video conferencing for a telehealth visit today.  Pt was last seen in cardiology clinic on 01/13/2023 by Dr. Clifton James.  At that time GRAY MAUGERI was doing well.  The patient is now pending procedure as outlined above. Since her last visit, she denies chest pain, shortness of breath, lower extremity edema, fatigue, palpitations, melena, hematuria, hemoptysis, diaphoresis, weakness, presyncope, syncope, orthopnea, and PND. She is active at home and church and is able to achieve > 4 METS without concerning cardiac symptoms.   Past Medical History    Past Medical History:  Diagnosis Date   Anemia, iron deficiency    Arthritis     L>R knee   Atrophic gastritis without mention of hemorrhage    CAD (coronary artery disease)    a. s/p Inferior STEMI in 04/2017 with angioplasty and thrombectomy to mid-RCA, repeat cath later that admission with aspiration thrombectomy of rPDA and residual thrombus along distal vessel too small for intervention   Colon polyps    Diaphragmatic hernia without mention of obstruction or gangrene    Diverticulosis of colon (without mention of hemorrhage)    Hiatal hernia    Hypertension    Unspecified hemorrhoids without mention of complication    Past Surgical History:  Procedure Laterality Date   ABDOMINAL HYSTERECTOMY  1978   CHOLECYSTECTOMY  1979   CORONARY ANGIOGRAPHY N/A 05/02/2017   Procedure: CORONARY ANGIOGRAPHY (CATH LAB);  Surgeon: Yvonne Kendall, MD;  Location: [facility];  Service: Cardiovascular;  Laterality: N/A;   CORONARY BALLOON ANGIOPLASTY N/A 05/02/2017   Procedure: CORONARY BALLOON ANGIOPLASTY;  Surgeon: Yvonne Kendall, MD;  Location: [facility];  Service: Cardiovascular;  Laterality: N/A;   CORONARY THROMBECTOMY N/A 05/02/2017   Procedure: Coronary Thrombectomy;  Surgeon: Yvonne Kendall, MD;  Location: [facility];  Service: Cardiovascular;  Laterality: N/A;   CORONARY/GRAFT ACUTE MI REVASCULARIZATION N/A 05/01/2017   Procedure: Coronary/Graft Acute MI Revascularization;  Surgeon: Kathleene Hazel, MD;  Location: [facility];  Service: Cardiovascular;  Laterality: N/A;   KNEE SURGERY  2001   left    LEFT HEART CATH AND CORONARY ANGIOGRAPHY N/A 05/01/2017   Procedure: LEFT HEART CATH AND CORONARY ANGIOGRAPHY;  Surgeon: Kathleene Hazel,  MD;  Location: [facility];  Service: Cardiovascular;  Laterality: N/A;   TONSILLECTOMY AND ADENOIDECTOMY  1970   TOTAL KNEE ARTHROPLASTY Left 01/08/2022   Procedure: LEFT TOTAL KNEE ARTHROPLASTY;  Surgeon: Tarry Kos, MD;  Location: [facility];  Service: Orthopedics;  Laterality: Left;     Allergies  Allergies  Allergen Reactions   Macrobid [Nitrofurantoin Monohyd Macro] Nausea And Vomiting and Other (See Comments)    Stomach pain   Sulfa Antibiotics Nausea And Vomiting and Other (See Comments)    Lethargic, tired    Home Medications    Prior to Admission medications   Medication Sig Start Date End Date Taking? Authorizing Provider  amoxicillin (AMOXIL) 500 MG capsule Take four pills one hour prior to dental work 06/11/23   Cristie Hem, PA-C  aspirin 81 MG chewable tablet Chew 1 tablet (81 mg total) by mouth daily. 05/06/17   Arty Baumgartner, NP  atorvastatin (LIPITOR) 80 MG tablet TAKE 1 TABLET BY MOUTH ONCE  DAILY 02/19/23   Dyann Kief, PA-C  Calcium Carbonate Antacid 1177 MG CHEW Chew 1,177 mg by mouth as needed (as needed for heartburn).    [provider]  clopidogrel (PLAVIX) 75 MG tablet TAKE 1 TABLET BY MOUTH DAILY 02/19/23   Kathleene Hazel, MD  cyanocobalamin (VITAMIN B12) 1000 MCG tablet Take 1,000 mcg by mouth daily.    [provider]  metoprolol tartrate (LOPRESSOR) 25 MG tablet TAKE 1 TABLET BY MOUTH  TWICE DAILY 01/25/22   Kathleene Hazel, MD  Multiple Vitamins-Minerals (MULTIVITAMIN WITH MINERALS) tablet Take 1 tablet by mouth daily.    [provider]  nitroGLYCERIN (NITROSTAT) 0.4 MG SL tablet DISSOLVE 1 TABLET UNDER THE  TONGUE EVERY 5 MINUTES AS NEEDED FOR CHEST PAIN. MAX OF 3 TABLETS IN 15 MINUTES. CALL 911 IF PAIN  PERSISTS. 11/20/21   Kathleene Hazel, MD  timolol (TIMOPTIC) 0.5 % ophthalmic solution Place 1 drop into both eyes daily. 11/14/10   [provider]    Physical Exam    Vital Signs:  Loetta Rough does not have vital signs available for review today.  Given telephonic nature of communication, physical exam is limited. AAOx3. NAD. Normal affect.  Speech and respirations are unlabored.  Accessory Clinical Findings    None  Assessment & Plan    1.  Preoperative  Cardiovascular Risk Assessment: According to the Revised Cardiac Risk Index (RCRI), her Perioperative Risk of Major Cardiac Event is (%): 0.9. Her Functional Capacity in METs is: 6.05 according to the Duke Activity Status Index (DASI). The patient is doing well from a cardiac perspective. Therefore, based on ACC/AHA guidelines, the patient would be at acceptable risk for the planned procedure without further cardiovascular testing.   The patient was advised that if she develops new symptoms prior to surgery to contact our office to arrange for a follow-up visit, and she verbalized understanding.  Regarding ASA therapy, we recommend continuation of ASA throughout the perioperative period. However, if the surgeon feels that cessation of ASA is required in the perioperative period, it may be stopped 5-7 days prior to surgery with a plan to resume it as soon as felt to be feasible from a surgical standpoint in the post-operative period.   Per office protocol, she may hold Plavix for 5 days prior to procedure and should resume as soon as hemodynamically stable postoperatively.   A copy of this note will be routed to requesting surgeon.  Time:  Today, I have spent 10 minutes with the patient with telehealth technology discussing medical history, symptoms, and management plan.    Levi Aland, NP-C  07/07/2023, 10:15 AM [address] Office [phone] Fax [phone]

## 2023-07-21 DIAGNOSIS — Z09 Encounter for follow-up examination after completed treatment for conditions other than malignant neoplasm: Secondary | ICD-10-CM | POA: Diagnosis not present

## 2023-07-21 DIAGNOSIS — K5730 Diverticulosis of large intestine without perforation or abscess without bleeding: Secondary | ICD-10-CM | POA: Diagnosis not present

## 2023-07-21 DIAGNOSIS — K648 Other hemorrhoids: Secondary | ICD-10-CM | POA: Diagnosis not present

## 2023-07-21 DIAGNOSIS — Z860101 Personal history of adenomatous and serrated colon polyps: Secondary | ICD-10-CM | POA: Diagnosis not present

## 2023-08-11 DIAGNOSIS — H25813 Combined forms of age-related cataract, bilateral: Secondary | ICD-10-CM | POA: Diagnosis not present

## 2023-08-11 DIAGNOSIS — H401132 Primary open-angle glaucoma, bilateral, moderate stage: Secondary | ICD-10-CM | POA: Diagnosis not present

## 2023-08-19 ENCOUNTER — Other Ambulatory Visit: Payer: Self-pay

## 2023-08-19 ENCOUNTER — Encounter (HOSPITAL_COMMUNITY): Payer: Self-pay | Admitting: *Deleted

## 2023-08-19 ENCOUNTER — Emergency Department (HOSPITAL_COMMUNITY)
Admission: EM | Admit: 2023-08-19 | Discharge: 2023-08-19 | Disposition: A | Discharge status: Home / Self Care | Attending: Emergency Medicine | Admitting: Emergency Medicine

## 2023-08-19 ENCOUNTER — Emergency Department (HOSPITAL_COMMUNITY)

## 2023-08-19 DIAGNOSIS — R079 Chest pain, unspecified: Secondary | ICD-10-CM | POA: Diagnosis not present

## 2023-08-19 DIAGNOSIS — Z7902 Long term (current) use of antithrombotics/antiplatelets: Secondary | ICD-10-CM | POA: Diagnosis not present

## 2023-08-19 DIAGNOSIS — R110 Nausea: Secondary | ICD-10-CM | POA: Insufficient documentation

## 2023-08-19 DIAGNOSIS — Z7982 Long term (current) use of aspirin: Secondary | ICD-10-CM | POA: Insufficient documentation

## 2023-08-19 DIAGNOSIS — I2510 Atherosclerotic heart disease of native coronary artery without angina pectoris: Secondary | ICD-10-CM | POA: Insufficient documentation

## 2023-08-19 DIAGNOSIS — R0789 Other chest pain: Secondary | ICD-10-CM | POA: Diagnosis not present

## 2023-08-19 DIAGNOSIS — Z79899 Other long term (current) drug therapy: Secondary | ICD-10-CM | POA: Insufficient documentation

## 2023-08-19 DIAGNOSIS — I10 Essential (primary) hypertension: Secondary | ICD-10-CM | POA: Diagnosis not present

## 2023-08-19 LAB — BASIC METABOLIC PANEL WITH GFR
Anion gap: 11 (ref 5–15)
BUN: 18 mg/dL (ref 8–23)
CO2: 22 mmol/L (ref 22–32)
Calcium: 9.3 mg/dL (ref 8.9–10.3)
Chloride: 103 mmol/L (ref 98–111)
Creatinine, Ser: 1.03 mg/dL — ABNORMAL HIGH (ref 0.44–1.00)
GFR, Estimated: 56 mL/min — ABNORMAL LOW (ref >60)
Glucose, Bld: 106 mg/dL — ABNORMAL HIGH (ref 70–99)
Potassium: 3.7 mmol/L (ref 3.5–5.1)
Sodium: 136 mmol/L (ref 135–145)

## 2023-08-19 LAB — CBC
HCT: 32.4 % — ABNORMAL LOW (ref 36.0–46.0)
Hemoglobin: 9.4 g/dL — ABNORMAL LOW (ref 12.0–15.0)
MCH: 21.8 pg — ABNORMAL LOW (ref 26.0–34.0)
MCHC: 29 g/dL — ABNORMAL LOW (ref 30.0–36.0)
MCV: 75.2 fL — ABNORMAL LOW (ref 80.0–100.0)
Platelets: 327 10*3/uL (ref 150–400)
RBC: 4.31 MIL/uL (ref 3.87–5.11)
RDW: 24.1 % — ABNORMAL HIGH (ref 11.5–15.5)
WBC: 9.2 10*3/uL (ref 4.0–10.5)
nRBC: 4.1 % — ABNORMAL HIGH (ref 0.0–0.2)

## 2023-08-19 LAB — TROPONIN I (HIGH SENSITIVITY)
Troponin I (High Sensitivity): 6 ng/L (ref <18)
Troponin I (High Sensitivity): 6 ng/L (ref <18)

## 2023-08-19 NOTE — Discharge Instructions (Signed)
 If you develop recurrent, continued, or worsening chest pain, shortness of breath, fever, vomiting, abdominal or back pain, or any other new/concerning symptoms then return to the ER for evaluation.

## 2023-08-19 NOTE — ED Provider Triage Note (Signed)
 Emergency Medicine Provider Triage Evaluation Note  Jenna Thompson , a 78 y.o. female  was evaluated in triage.  Pt complains of chest pain. Started last night. + nausea. Feels like previous heart attack in 2019. No shortness of breath  Review of Systems  Positive:  Negative:   Physical Exam  BP (!) 157/82 (BP Location: Left Arm)   Pulse 86   Temp 99.6 F (37.6 C)   Resp 18   SpO2 100%  Gen:   Awake, no distress   Resp:  Normal effort  MSK:   Moves extremities without difficulty  Other:    Medical Decision Making  Medically screening exam initiated at 1:34 PM.  Appropriate orders placed.  Jenna Thompson was informed that the remainder of the evaluation will be completed by another provider, this initial triage assessment does not replace that evaluation, and the importance of remaining in the ED until their evaluation is complete.     Sherra Dk, PA-C 08/19/23 1334

## 2023-08-19 NOTE — ED Provider Notes (Signed)
 [place] EMERGENCY DEPARTMENT AT [facility] Provider Note   CSN: [id number] Arrival date & time: 08/19/23  1314     History  Chief Complaint  Patient presents with   Chest Pain    Jenna Thompson is a 78 y.o. female.   Chest Pain Patient presents with anterior chest pain.  Is felt it for around 2 weeks but states that today developed some nausea.  Anterior chest.  Does have cardiac history. States she feels if her heart is going in a checkmark.  Has not had exertional pain.  Previous STEMI.   Past Medical History:  Diagnosis Date   Anemia, iron  deficiency    Arthritis    L>R knee   Atrophic gastritis without mention of hemorrhage    CAD (coronary artery disease)    a. s/p Inferior STEMI in 04/2017 with angioplasty and thrombectomy to mid-RCA, repeat cath later that admission with aspiration thrombectomy of rPDA and residual thrombus along distal vessel too small for intervention   Colon polyps    Diaphragmatic hernia without mention of obstruction or gangrene    Diverticulosis of colon (without mention of hemorrhage)    Hiatal hernia    Hypertension    Unspecified hemorrhoids without mention of complication    ' Home Medications Prior to Admission medications   Medication Sig Start Date End Date Taking? Authorizing Provider  aspirin  81 MG chewable tablet Chew 1 tablet (81 mg total) by mouth daily. Patient taking differently: Chew 81 mg by mouth at bedtime. 05/06/17  Yes Sanjuanita Cruz, NP  atorvastatin  (LIPITOR ) 80 MG tablet TAKE 1 TABLET BY MOUTH ONCE  DAILY Patient taking differently: Take 80 mg by mouth every evening. 02/19/23  Yes Flo Hummingbird, PA-C  Calcium  Carbonate Antacid 1177 MG CHEW Chew 1,177 mg by mouth as needed (as needed for heartburn).   Yes [provider]  clopidogrel  (PLAVIX ) 75 MG tablet TAKE 1 TABLET BY MOUTH DAILY Patient taking differently: Take 75 mg by mouth every evening. 02/19/23  Yes Odie Benne, MD   cyanocobalamin (VITAMIN B12) 1000 MCG tablet Take 1,000 mcg by mouth as needed.   Yes [provider]  dorzolamide-timolol  (COSOPT) 2-0.5 % ophthalmic solution Place 1 drop into both eyes 2 (two) times daily.   Yes [provider]  metoprolol  tartrate (LOPRESSOR ) 25 MG tablet TAKE 1 TABLET BY MOUTH  TWICE DAILY 01/25/22  Yes Odie Benne, MD  Multiple Vitamins-Minerals (MULTIVITAMIN WITH MINERALS) tablet Take 1 tablet by mouth as needed.   Yes [provider]  nitroGLYCERIN  (NITROSTAT ) 0.4 MG SL tablet DISSOLVE 1 TABLET UNDER THE  TONGUE EVERY 5 MINUTES AS NEEDED FOR CHEST PAIN. MAX OF 3 TABLETS IN 15 MINUTES. CALL 911 IF PAIN  PERSISTS. Patient taking differently: Place 0.4 mg under the tongue every 5 (five) minutes as needed for chest pain. 11/20/21  Yes Odie Benne, MD      Allergies    Macrobid [nitrofurantoin monohyd macro] and Sulfa antibiotics    Review of Systems   Review of Systems  Cardiovascular:  Positive for chest pain.    Physical Exam Updated Vital Signs BP (!) 149/74   Pulse 77   Temp 99.6 F (37.6 C)   Resp 18   Ht 5\' 10"  (1.778 m)   Wt 75.8 kg   SpO2 100%   BMI 23.96 kg/m  Physical Exam Vitals and nursing note reviewed.  Cardiovascular:     Rate and Rhythm: Normal rate and  regular rhythm.  Pulmonary:     Breath sounds: No decreased breath sounds or wheezing.  Chest:     Chest wall: No tenderness.  Abdominal:     Tenderness: There is no abdominal tenderness.  Neurological:     Mental Status: She is alert.     ED Results / Procedures / Treatments   Labs (all labs ordered are listed, but only abnormal results are displayed) Labs Reviewed  BASIC METABOLIC PANEL WITH GFR - Abnormal; Notable for the following components:      Result Value   Glucose, Bld 106 (*)    Creatinine, Ser 1.03 (*)    GFR, Estimated 56 (*)    All other components within normal limits  CBC - Abnormal; Notable for the following  components:   Hemoglobin 9.4 (*)    HCT 32.4 (*)    MCV 75.2 (*)    MCH 21.8 (*)    MCHC 29.0 (*)    RDW 24.1 (*)    nRBC 4.1 (*)    All other components within normal limits  TROPONIN I (HIGH SENSITIVITY)  TROPONIN I (HIGH SENSITIVITY)    EKG EKG Interpretation Date/Time:  Tuesday Aug 19 2023 13:22:40 EDT Ventricular Rate:  86 PR Interval:  146 QRS Duration:  88 QT Interval:  354 QTC Calculation: 423 R Axis:   5  Text Interpretation: Normal sinus rhythm Minimal voltage criteria for LVH, may be normal variant ( R in aVL ) Inferior infarct , age undetermined Anterolateral infarct , age undetermined Abnormal ECG When compared with ECG of 13-Jan-2023 09:01, No significant change since last tracing Confirmed by Mozell Arias [phone]) on 08/19/2023 2:55:27 PM  Radiology DG Chest 2 View Result Date: 08/19/2023 CLINICAL DATA:  Chest pain EXAM: CHEST - 2 VIEW COMPARISON:  January 02, 2019 FINDINGS: The heart size and mediastinal contours are within normal limits. Both lungs are clear. The visualized skeletal structures are unremarkable. IMPRESSION: No active cardiopulmonary disease. Electronically Signed   By: Fredrich Jefferson M.D.   On: 08/19/2023 14:46    Procedures Procedures    Medications Ordered in ED Medications - No data to display  ED Course/ Medical Decision Making/ A&P                                 Medical Decision Making Amount and/or Complexity of Data Reviewed Labs: ordered. Radiology: ordered.   Patient with chest pain.  Differential diagnosis along with include causes such as nonspecific chest pain, coronary artery disease, aortic dissection.  EKG reassuring.  Chest x-ray reassuring.  Initial troponin negative.  However with change in chest pain today will get delta troponin.  Doubt aortic dissection or severe aneurysm.  If troponin stable should likely be able to discharge home with outpatient follow-up.  Care turned over to Dr.  Aldean Amass        Final Clinical Impression(s) / ED Diagnoses Final diagnoses:  Nonspecific chest pain    Rx / DC Orders ED Discharge Orders     None         Mozell Arias, MD 08/19/23 [phone]

## 2023-08-19 NOTE — ED Provider Notes (Signed)
 Patient care transferred to me.  She is feeling well.  Troponin is flat and stable.  Unlikely to be ACS.  Doubt PE or dissection.  Will discharge home with return precautions.   Jerilynn Montenegro, MD 08/19/23 [phone]

## 2023-08-19 NOTE — ED Notes (Signed)
 Patient discharged by RN. Patient verbalizes understanding of instructions without additional questions.

## 2023-08-19 NOTE — ED Triage Notes (Signed)
 Pt is here for central chest pain which began last pm and feels like tightness.  Pt has had nausea with this.

## 2023-08-19 NOTE — ED Notes (Signed)
 Phleb to obtain labs

## 2023-08-19 NOTE — ED Notes (Addendum)
Patient ambulated to bathroom with minimal assistance.  

## 2023-08-22 DIAGNOSIS — R7303 Prediabetes: Secondary | ICD-10-CM | POA: Diagnosis not present

## 2023-08-22 DIAGNOSIS — R110 Nausea: Secondary | ICD-10-CM | POA: Diagnosis not present

## 2023-08-22 DIAGNOSIS — N390 Urinary tract infection, site not specified: Secondary | ICD-10-CM | POA: Diagnosis not present

## 2023-09-04 DIAGNOSIS — R944 Abnormal results of kidney function studies: Secondary | ICD-10-CM | POA: Diagnosis not present

## 2023-09-04 DIAGNOSIS — R7301 Impaired fasting glucose: Secondary | ICD-10-CM | POA: Diagnosis not present

## 2023-09-22 ENCOUNTER — Other Ambulatory Visit: Payer: Self-pay | Admitting: Cardiovascular Disease

## 2023-10-02 DIAGNOSIS — H25811 Combined forms of age-related cataract, right eye: Secondary | ICD-10-CM | POA: Diagnosis not present

## 2023-10-07 DIAGNOSIS — H25811 Combined forms of age-related cataract, right eye: Secondary | ICD-10-CM | POA: Diagnosis not present

## 2023-10-08 DIAGNOSIS — H25812 Combined forms of age-related cataract, left eye: Secondary | ICD-10-CM | POA: Diagnosis not present

## 2023-10-08 DIAGNOSIS — Z961 Presence of intraocular lens: Secondary | ICD-10-CM | POA: Diagnosis not present

## 2023-10-08 DIAGNOSIS — Z9841 Cataract extraction status, right eye: Secondary | ICD-10-CM | POA: Diagnosis not present

## 2023-10-08 DIAGNOSIS — H401132 Primary open-angle glaucoma, bilateral, moderate stage: Secondary | ICD-10-CM | POA: Diagnosis not present

## 2023-11-07 DIAGNOSIS — H401132 Primary open-angle glaucoma, bilateral, moderate stage: Secondary | ICD-10-CM | POA: Diagnosis not present

## 2023-12-01 ENCOUNTER — Other Ambulatory Visit: Payer: Self-pay | Admitting: Cardiovascular Disease

## 2023-12-09 ENCOUNTER — Other Ambulatory Visit: Payer: Self-pay | Admitting: Cardiovascular Disease

## 2024-02-26 ENCOUNTER — Other Ambulatory Visit: Payer: Self-pay | Admitting: Cardiovascular Disease

## 2024-02-27 MED ORDER — METOPROLOL TARTRATE 25 MG PO TABS: 25.0000 mg | ORAL_TABLET | Freq: Two times a day (BID) | ORAL | 0 refills | Status: AC

## 2024-03-04 ENCOUNTER — Other Ambulatory Visit: Payer: Self-pay | Admitting: Physician Assistant
